# Patient Record
Sex: Female | Born: 1935 | Race: White | Hispanic: No | Smoking: Never smoker
Health system: Southern US, Community
[De-identification: ages and names within clinical notes are randomized; demographics above are authoritative.]

## PROBLEM LIST (undated history)

## (undated) DIAGNOSIS — I639 Cerebral infarction, unspecified: Secondary | ICD-10-CM

## (undated) DIAGNOSIS — N63 Unspecified lump in unspecified breast: Secondary | ICD-10-CM

## (undated) DIAGNOSIS — I4891 Unspecified atrial fibrillation: Secondary | ICD-10-CM

## (undated) DIAGNOSIS — I10 Essential (primary) hypertension: Secondary | ICD-10-CM

## (undated) HISTORY — DX: Cerebral infarction, unspecified: I63.9

## (undated) HISTORY — DX: Unspecified lump in unspecified breast: N63.0

## (undated) HISTORY — PX: DILATION AND CURETTAGE OF UTERUS: SHX78

## (undated) HISTORY — DX: Unspecified atrial fibrillation: I48.91

## (undated) HISTORY — DX: Essential (primary) hypertension: I10

## (undated) HISTORY — PX: FOOT SURGERY: SHX648

---

## 1978-02-10 HISTORY — PX: BREAST SURGERY: SHX581

## 2004-02-09 ENCOUNTER — Other Ambulatory Visit: Payer: Self-pay

## 2004-02-09 ENCOUNTER — Ambulatory Visit: Payer: Self-pay | Admitting: Obstetrics and Gynecology

## 2004-02-16 ENCOUNTER — Ambulatory Visit: Payer: Self-pay | Admitting: Obstetrics and Gynecology

## 2004-04-01 ENCOUNTER — Ambulatory Visit: Payer: Self-pay | Admitting: Internal Medicine

## 2005-01-29 ENCOUNTER — Ambulatory Visit: Payer: Self-pay | Admitting: Internal Medicine

## 2005-04-03 ENCOUNTER — Ambulatory Visit: Payer: Self-pay | Admitting: Internal Medicine

## 2006-04-07 ENCOUNTER — Ambulatory Visit: Payer: Self-pay | Admitting: Internal Medicine

## 2007-04-29 ENCOUNTER — Ambulatory Visit: Payer: Self-pay | Admitting: Internal Medicine

## 2007-06-08 ENCOUNTER — Other Ambulatory Visit: Payer: Self-pay

## 2007-06-08 ENCOUNTER — Ambulatory Visit: Payer: Self-pay | Admitting: Obstetrics and Gynecology

## 2007-06-14 ENCOUNTER — Ambulatory Visit: Payer: Self-pay | Admitting: Obstetrics and Gynecology

## 2008-05-01 ENCOUNTER — Ambulatory Visit: Payer: Self-pay | Admitting: Internal Medicine

## 2009-05-02 ENCOUNTER — Ambulatory Visit: Payer: Self-pay | Admitting: Internal Medicine

## 2010-05-06 ENCOUNTER — Ambulatory Visit: Payer: Self-pay | Admitting: Internal Medicine

## 2011-05-07 ENCOUNTER — Ambulatory Visit: Payer: Self-pay | Admitting: Internal Medicine

## 2012-05-07 ENCOUNTER — Ambulatory Visit: Payer: Self-pay | Admitting: Internal Medicine

## 2012-05-11 ENCOUNTER — Ambulatory Visit: Payer: Self-pay | Admitting: Internal Medicine

## 2012-05-27 ENCOUNTER — Ambulatory Visit (INDEPENDENT_AMBULATORY_CARE_PROVIDER_SITE_OTHER): Payer: Medicare Other | Admitting: General Surgery

## 2012-05-27 ENCOUNTER — Encounter: Payer: Self-pay | Admitting: General Surgery

## 2012-05-27 VITALS — BP 122/62 | HR 70 | Resp 12 | Ht 68.0 in | Wt 201.0 lb

## 2012-05-27 DIAGNOSIS — R920 Mammographic microcalcification found on diagnostic imaging of breast: Secondary | ICD-10-CM | POA: Insufficient documentation

## 2012-05-27 DIAGNOSIS — N63 Unspecified lump in unspecified breast: Secondary | ICD-10-CM | POA: Insufficient documentation

## 2012-05-27 NOTE — Progress Notes (Signed)
Patient ID: Nancy Hahn, female   DOB: 1931/07/06, 79 y.o.   MRN: [id number]  Chief Complaint  Patient presents with  . New Breast Patient    Category 4 mammogram    HPI Nancy Hahn is a 79 y.o. female here today for an abnormal mammogram. Most recent mammogram was done at [facility] on 05/07/12 with birad category 0. Additional views of the left breast on 05/11/12 with birad category 4. The patient has had a left breast biopsy done back in 1980 that was benign. She has a sister with a history of breast cancer. She also had a maternal aunt with ovarian cancer. She denies any problems with her breasts at this time. She does regular self breast exams as well as yearly mammograms.  HPI  Past Medical History  Diagnosis Date  . Breast mass   . Hypertension     Past Surgical History  Procedure Laterality Date  . Breast surgery  1980    biopsy    Family History  Problem Relation Age of Onset  . Emphysema Father   . Cancer Brother     lung  . Cancer Sister     breast  . Cancer Maternal Aunt     ovarian    Social History History  Substance Use Topics  . Smoking status: Never Smoker   . Smokeless tobacco: Never Used  . Alcohol Use: No    No Known Allergies  Current Outpatient Prescriptions  Medication Sig Dispense Refill  . aspirin 81 MG tablet Take 81 mg by mouth daily.      Marland Kitchen atenolol (TENORMIN) 50 MG tablet Take 50 mg by mouth daily.      Marland Kitchen lisinopril (PRINIVIL,ZESTRIL) 40 MG tablet Take 40 mg by mouth daily.      Marland Kitchen NIFEdipine (PROCARDIA XL/ADALAT-CC) 90 MG 24 hr tablet Take 90 mg by mouth daily.      . simvastatin (ZOCOR) 20 MG tablet Take 20 mg by mouth daily.       No current facility-administered medications for this visit.    Review of Systems Review of Systems  Constitutional: Negative.   Respiratory: Negative.   Cardiovascular: Negative.     Blood pressure 122/62, pulse 70, resp. rate 12, height 5\' 8"  (1.727 m), weight 201 lb (91.173 kg).  Physical Exam Physical  Exam  Constitutional: She appears well-developed and well-nourished.  Neck: Trachea normal. No mass and no thyromegaly present.  Cardiovascular: Normal rate, regular rhythm, normal heart sounds and normal pulses.   No murmur heard. Pulmonary/Chest: Effort normal and breath sounds normal. Right breast exhibits no inverted nipple, no mass, no nipple discharge, no skin change and no tenderness. Left breast exhibits no inverted nipple, no mass, no nipple discharge, no skin change and no tenderness. Breasts are symmetrical.  Lymphadenopathy:    She has no cervical adenopathy.    She has no axillary adenopathy.    Data Reviewed Bilateral mammograms on May 07, 2012 as well as left breast unilateral mammograms dated May 11, 2012 were reviewed. A could not clearly delineate the areas of microcalcifications of concern to the radiologist. He will be necessary to review these in person with the radiologist next week. Assessment    Left breast microcalcifications.     Plan    I will arrange to review the mammograms in person with the attending radiologist to determine the area of his concern. If there is a significant interval change from previous films, the patient has been advised a stereotactic biopsy  might be appropriate. Options for management would include: 1) early biopsy versus 2) a 6 month followup study.  The stereotactic procedure was reviewed.       Earline Mayotte 05/28/2012, 8:33 PM

## 2012-05-27 NOTE — Patient Instructions (Addendum)
Dr. Lemar Livings will discuss with Radiologist concerns of mammogram and will give patient a call.

## 2012-05-28 ENCOUNTER — Encounter: Payer: Self-pay | Admitting: General Surgery

## 2012-06-02 ENCOUNTER — Telehealth: Payer: Self-pay | Admitting: *Deleted

## 2012-06-02 ENCOUNTER — Other Ambulatory Visit (INDEPENDENT_AMBULATORY_CARE_PROVIDER_SITE_OTHER): Payer: Self-pay | Admitting: General Surgery

## 2012-06-02 NOTE — Progress Notes (Signed)
The recent mammograms were reviewed with the attending radiologist. Minimal interval change since 2009. Will arrange for a follow up left mammogram in six months.

## 2012-06-02 NOTE — Telephone Encounter (Signed)
Notified patient as instructed, patient pleased. Discussed follow-up appointments, patient agrees  

## 2012-06-02 NOTE — Telephone Encounter (Signed)
Message copied by Currie Paris on Wed Jun 02, 2012  8:40 AM ------      Message from: [place], [place] W      Created: Wed Jun 02, 2012  7:34 AM       Mindi Junker: Please notify the patient I reviewed the mammograms with the radiologist.  Would recommend a repeat mammogram of the left breast in six months. No biopsy at this time.            Michelle: Arrange for left diag mammogram and FSC in Oct w/ OV to follow. Thanks. ------

## 2012-06-07 ENCOUNTER — Other Ambulatory Visit: Payer: Self-pay | Admitting: *Deleted

## 2012-06-07 DIAGNOSIS — R920 Mammographic microcalcification found on diagnostic imaging of breast: Secondary | ICD-10-CM

## 2012-06-07 NOTE — Progress Notes (Signed)
The patient has been asked to return to the office in six months for a unilateral left breast diagnostic mammogram with FSC views.  

## 2012-11-16 ENCOUNTER — Encounter: Payer: Self-pay | Admitting: General Surgery

## 2012-11-16 ENCOUNTER — Ambulatory Visit: Payer: Self-pay | Admitting: General Surgery

## 2012-12-01 ENCOUNTER — Ambulatory Visit: Payer: Medicare Other | Admitting: General Surgery

## 2013-09-21 ENCOUNTER — Ambulatory Visit: Payer: Self-pay | Admitting: Family Medicine

## 2013-12-12 ENCOUNTER — Encounter: Payer: Self-pay | Admitting: General Surgery

## 2014-02-26 ENCOUNTER — Observation Stay: Payer: Self-pay | Admitting: Orthopedic Surgery

## 2014-02-26 LAB — URINALYSIS, COMPLETE
BILIRUBIN, UR: NEGATIVE
Blood: NEGATIVE
Glucose,UR: NEGATIVE mg/dL (ref 0–75)
Hyaline Cast: 2
Ketone: NEGATIVE
LEUKOCYTE ESTERASE: NEGATIVE
Nitrite: NEGATIVE
Ph: 7 (ref 4.5–8.0)
Protein: NEGATIVE
RBC, UR: NONE SEEN /HPF (ref 0–5)
Specific Gravity: 1.005 (ref 1.003–1.030)
WBC UR: 1 /HPF (ref 0–5)

## 2014-02-26 LAB — COMPREHENSIVE METABOLIC PANEL
ANION GAP: 8 (ref 7–16)
Albumin: 3.7 g/dL (ref 3.4–5.0)
Alkaline Phosphatase: 90 U/L
BUN: 22 mg/dL — ABNORMAL HIGH (ref 7–18)
Bilirubin,Total: 0.3 mg/dL (ref 0.2–1.0)
CALCIUM: 8.9 mg/dL (ref 8.5–10.1)
CO2: 25 mmol/L (ref 21–32)
Chloride: 107 mmol/L (ref 98–107)
Creatinine: 1.19 mg/dL (ref 0.60–1.30)
EGFR (African American): 56 — ABNORMAL LOW
GFR CALC NON AF AMER: 46 — AB
Glucose: 125 mg/dL — ABNORMAL HIGH (ref 65–99)
Osmolality: 284 (ref 275–301)
POTASSIUM: 4.6 mmol/L (ref 3.5–5.1)
SGOT(AST): 30 U/L (ref 15–37)
SGPT (ALT): 27 U/L
SODIUM: 140 mmol/L (ref 136–145)
Total Protein: 7.8 g/dL (ref 6.4–8.2)

## 2014-02-26 LAB — CBC
HCT: 36.2 % (ref 35.0–47.0)
HGB: 11.9 g/dL — AB (ref 12.0–16.0)
MCH: 30.2 pg (ref 26.0–34.0)
MCHC: 32.8 g/dL (ref 32.0–36.0)
MCV: 92 fL (ref 80–100)
Platelet: 256 10*3/uL (ref 150–440)
RBC: 3.94 10*6/uL (ref 3.80–5.20)
RDW: 13.3 % (ref 11.5–14.5)
WBC: 15.5 10*3/uL — ABNORMAL HIGH (ref 3.6–11.0)

## 2014-06-11 NOTE — Op Note (Signed)
PATIENT NAME:  Edwin CapWRENN, Tekeya B MR#:  [id number] DATE OF BIRTH:  [birth year]  PREOPERATIVE DIAGNOSIS: Left bimalleolar ankle fracture.   PROCEDURE PERFORMED: Left bimalleolar ankle fracture.  PROCEDURE PERFORMED: Open reduction internal fixation of left ankle.   SURGEON OF RECORD: Thornton Papasobin Eckel, MD.  ANESTHESIA: Spinal.   FLUIDS ADMINISTERED: 900 of crystalloid.   ESTIMATED BLOOD LOSS: 20 mL.   TOTAL TOURNIQUET TIME: 47 minutes.   PREOPERATIVE ANTIBIOTICS: 1 gram Ancef.   COMPLICATIONS: None.   DISPOSITION: Stable to PACU on upon transport.   INDICATIONS FOR PROCEDURE: This is an 81 year old female, who slipped in the bathroom with immediate pain and inability to bear weight on her left lower extremity. X-rays demonstrate a bimalleolar ankle fracture. She was counseled regarding risks and benefits of surgery with the risks to include, but not limited to bleeding, infection, damage to nerves and/or vessels, malunion, nonunion continued pain and the need for additional surgical procedures. She was also counseled regarding the risks and signs and symptoms of DVT postoperatively. She agreed to proceed with surgery after weighing all the risks.   DESCRIPTION OF PROCEDURE: The patient was brought to the operating room and placed on the operative table with all extremities appropriately padded. The left leg was then cleansed with alcohol and dried, then prepped and draped in sterile fashion. Timeout was performed to anesthesia, nursing staff, and surgeon of record to confirm surgical site, patient, medical record number, date of birth, laterality, procedure to be performed, confirmation antibiotics were administered, and all necessary equipment was in the room. We then exsanguinated the extremity with an Esmarch bandage and inflated the tourniquet to 250 mm/Hg. A lateral incision was made overlying the fibula with dissection down to the fracture site. A reduction clamp used to reduce the fracture site,  and this was secured in place with a 2.7 mm lag screw. We then used the DePuy anatomic distal fibular locking plate and secured the plate with 4 locking screws distally, 1 nonlocking compression screw proximally, and 2 additional locking screws proximally. AP and lateral fluoroscopic images confirmed fracture reduction and plate placement. We then turned our attention medially, where incision was made overlying the medial malleolus fragment. This fracture site was also identified and reduced with a reduction clamp and secured it in place with 2 partially threaded cannulated 4.0 screws. Again, final x-rays taken to confirm both fracture reduction and hardware placement. Both wounds were then copiously irrigated with sterile saline and the tourniquet was released at 47 minutes, and hemostasis was obtained. The wound was then closed sequentially with 2-0 Vicryl followed by 3-0 Monocryl and running 3-0 nylon. A sterile dressing was applied and a well-padded L and U splint was placed. The patient was transferred to the PACU in stable condition.   ____________________________ Danelle Earthlyobin T. Eckel, MD tte:ap D: 02/26/2014 19:13:29 ET T: 02/26/2014 21:08:22 ET JOB#: [id number]  cc: Danelle Earthlyobin T. Eckel, MD, <Dictator> Danelle EarthlyBIN T ECKEL MD ELECTRONICALLY SIGNED 02/28/2014 20:22

## 2014-06-11 NOTE — Consult Note (Signed)
PATIENT NAME:  Nancy Hahn, Nancy Hahn MR#:  [id number] DATE OF BIRTH:  28-Oct-1931  PRIMARY CARE PHYSICIAN: Kandyce RudMarcus Babaoff, MD  CONSULTING PHYSICIAN:  Danelle Earthlyobin T. Eckel, MD of orthopedics.   REASON FOR CONSULTATION: Preop evaluation hypertension.   HISTORY OF PRESENT ILLNESS: An 81 year old Caucasian female patient with history of hypertension, hyperlipidemia, presents to the emergency room after she slipped in the bathroom and fell. The patient had acute pain in her left ankle. Here, she has been diagnosed with left ankle fracture on the x-rays, going to the operating room, and the hospitalist team has been consulted for preop evaluation and hypertension management.   The patient has had some D and C surgeries a few years back with no complications to anesthesia. She seems to have good functional status, walks, climbs stairs without any shortness of breath or chest pain. She had a stress just about 5 years back with her PCP, which was normal.   She does not have any cardiac history, strokes, liver disease.   PAST MEDICAL HISTORY:  1. Hypertension.  2. Hyperlipidemia.   SOCIAL HISTORY: The patient does not smoke. No alcohol. No illicit drugs. Lives alone. Ambulates on her own.   CODE STATUS: Full code.   FAMILY HISTORY: Diabetes in her sister and mother.   HOME MEDICATIONS:  1. Simvastatin 20 mg daily.  2. Norco 325/5 mg 1 tablet every 6 hours as needed.  3. Nifedipine extended release 90 mg daily.  4. Lisinopril 40 mg daily.  5. Atenolol 50 mg daily.   ALLERGIES: NO KNOWN DRUG ALLERGIES.   REVIEW OF SYSTEMS: CONSTITUTIONAL: No fever or fatigue.  EYES: No blurred vision, pain or redness.  HEENT: No tinnitus, ear pain, hearing loss.  RESPIRATORY: No cough, wheeze, hemoptysis.  CARDIOVASCULAR: No dyspnea, orthopnea, or edema.  GASTROINTESTINAL: No nausea, vomiting, diarrhea, abdominal pain.  GENITOURINARY: No dysuria, hematuria, or frequency.  ENDOCRINE: No polyuria, nocturia, thyroid  problems.  HEMATOLOGIC AND LYMPHATIC: No anemia, easy bruising, bleeding.  INTEGUMENTARY: No acne, rash, lesion.  MUSCULOSKELETAL: No back pain, arthritis.  NEUROLOGIC: No focal numbness, weakness, seizure.  PSYCHIATRIC: No anxiety or depression.  EXTREMITIES: Has left ankle swelling and pain with splint.   DATA LABORATORY AND IMAGING STUDIES: Glucose 125, BUN 22, creatinine 1.19, GFR 46. AST, ALT, alkaline phosphatase, bilirubin normal. WBC 15.5, hemoglobin 11.9, platelets of 256,000. Urinalysis shows trace bacteria, 1 WBC.   Chest x-ray shows changes of COPD, nothing acute.   CT scan of the head without contrast shows enlarged ventricles due to atrophy, nothing acute.   Left ankle x-ray shows left ankle fracture.   EKG shows normal sinus rhythm, nonspecific ST-T wave changes.   ASSESSMENT AND PLAN:  1. Left ankle fracture. The patient is going to the OR. She will be low risk for surgery. I discussed with Dr. Rodena MedinEckel of orthopedics. The patient did not need any further investigations to surgery. We will watch out if she has any complications. We will add incentive spirometer. Deep vein prophylaxis per orthopedics.  2. Hypertension. Will continue home medications and can use IV p.r.n. labetalol 10 mg if needed.  3. Hyperlipidemia. The patient is on simvastatin.   CODE STATUS: Full code.   Time Spent on this consult was 45 minutes.   ____________________________ Molinda BailiffSrikar R. Colvin Blatt, MD srs:ap D: 02/26/2014 16:43:02 ET T: 02/26/2014 21:17:54 ET JOB#: [id number]  cc: Wardell HeathSrikar R. Elpidio AnisSudini, MD, <Dictator> Kandyce RudMarcus Babaoff, MD Orie FishermanSRIKAR R Zianna Dercole MD ELECTRONICALLY SIGNED 02/27/2014 16:49

## 2014-06-11 NOTE — Discharge Summary (Signed)
PATIENT NAME:  Nancy Hahn, Nancy Hahn MR#:  [id number] DATE OF BIRTH:  1931/05/28  DATE OF ADMISSION:  02/26/2014 DATE OF DISCHARGE:  02/28/2014  ADMITTING DIAGNOSIS:  Left bimalleolar ankle fracture.   DISCHARGE DIAGNOSIS: Left bimalleolar ankle fracture.   PROCEDURE PERFORMED: Open reduction internal fixation of left ankle.   SURGEON OF RECORD: Thornton Papasobin Eckel, M.D.   ANESTHESIA: Spinal.  FLUIDS ADMINISTERED:  900 mL of crystalloid.   ESTIMATED BLOOD LOSS:  20 mL.  TOURNIQUET TIME:  47 minutes.   PREOPERATIVE ANTIBIOTICS:  1 gram Ancef.   COMPLICATIONS: None.   DISPOSITION: Stable to PACU upon transport.   HISTORY: The patient is an 81 year old female who slipped in the bathroom with immediate pain and inability to bear weight on the left lower extremity. X-rays demonstrate bimalleolar ankle fracture. She was counseled regarding risks and benefits of surgery with the risks including, but not limited to, bleeding, infection, damage to nerves and/or vessels, malunion, nonunion, continued pain and the need for additional surgical procedures. She was also counseled regarding the risks and signs and symptoms of DVT postoperatively. She agreed to proceed with surgery weighing all the risks.   PHYSICAL EXAMINATION:  GENERAL: Well developed.  RESPIRATIONS: Normal respiratory effort.  CARDIOVASCULAR: Regular rate.  ABDOMEN: Soft.  SKIN: Mild swelling left lower extremity splint in place. Palpable DP, PT pulses,   HOSPITAL COURSE: The patient was admitted to the hospital on 02/26/2014 through the Emergency Department. She underwent surgery that day and went to the orthopedic floor from the PACU in stable condition. On postoperative day 1, the patient's vital signs are stable. She had slow progress with physical therapy. Labs were stable. On postoperative day 2 the patient was stable and ready for discharge to a rehab facility. Unfortunately patient did not have authorization from medicare. Patients  discharge was delayed until POD3. On POD 3, patient was stable and ready for [facility]. She will continue with physical therapy. [facility].   DISCHARGE INSTRUCTIONS: Do not put any weight on the affected extremity. Elevate the affected foot or leg on 1 or 2 pillows with the foot higher than the knee. Knee-high TED hose on both legs and remove 1 hour per 8 hour shift. Elevate the heels off the bed.  Incentive spirometer every hour while awake. Encourage cough and deep breathing. The patient may resume a regular diet as tolerated. Continue using Polar Care unit maintaining temp between 40 and 50 degrees. Do not get the dressing or bandage wet or dirty. [facility] if the dressing gets water under it. Leave the dressing on. [facility] if any of the following occur: Bright red bleeding from the incision wound, fever above 101.5 degrees, redness, swelling, or drainage at the incision. Call [facility] if you experience any increased leg pain, numbness, or weakness in legs or bowel or bladder symptoms.   DISCHARGE MEDICATIONS: Please see list of discharge medications from discharge instructions.    ____________________________ T. Cranston Neighborhris Tylie Golonka, PA-C tcg:at D: 02/28/2014 08:53:00 ET T: 02/28/2014 09:13:34 ET JOB#: [id number]  cc: T. Cranston Neighborhris Aqueelah Cotrell, PA-C, <Dictator> Evon SlackHOMAS C Alford Gamero GeorgiaPA ELECTRONICALLY SIGNED 03/13/2014 8:41

## 2014-06-11 NOTE — H&P (Signed)
   Subjective/Chief Complaint Left ankle pain   History of Present Illness 81 y/o female who slipped in the bathroom this morning with immediate pain and inability to bear weight   Past History HTN   Past Med/Surgical Hx:  High Cholesterol:   HTN:   ALLERGIES:  No Known Allergies:   HOME MEDICATIONS: Medication Instructions Status  atenolol 50 mg oral tablet 1 tab(s) orally once a day Active  lisinopril 40 mg oral tablet 1 tab(s) orally once a day Active  simvastatin 20 mg oral tablet 1 tab(s) orally once a day (at bedtime) Active  NIFEdipine extended release 90 mg oral tablet, extended release 1 tab(s) orally once a day Active   Family and Social History:  Family History Non-Contributory   Social History negative tobacco, negative ETOH   Place of Living Home   Review of Systems:  Fever/Chills No   Cough No   Sputum No   Abdominal Pain No   Diarrhea No   Constipation No   Nausea/Vomiting No   SOB/DOE No   Chest Pain No   Physical Exam:  GEN well developed   RESP normal resp effort   CARD regular rate   ABD soft   SKIN mild swelling LLE, splint in place, palpable DP/PT pulses, SILT in all distributions    Assessment/Admission Diagnosis Left closed bimalleolar ankle fracture   Plan To the OR for ORIF left ankle. Patient was seen and evaluated by the hospitalist service and deemed to be low risk for surgery   Electronic Signatures: Danelle EarthlyEckel, Tobin T (MD)  (Signed 17-Jan-16 16:01)  Authored: CHIEF COMPLAINT and HISTORY, PAST MEDICAL/SURGIAL HISTORY, ALLERGIES, HOME MEDICATIONS, FAMILY AND SOCIAL HISTORY, REVIEW OF SYSTEMS, PHYSICAL EXAM, ASSESSMENT AND PLAN   Last Updated: 17-Jan-16 16:01 by Danelle EarthlyEckel, Tobin T (MD)

## 2015-02-27 ENCOUNTER — Encounter: Payer: Self-pay | Admitting: Emergency Medicine

## 2015-02-27 ENCOUNTER — Inpatient Hospital Stay
Admission: EM | Admit: 2015-02-27 | Discharge: 2015-03-03 | DRG: 270 | Disposition: A | Discharge status: Other Acute Inpatient Hospital | Payer: Commercial Managed Care - HMO | Attending: Internal Medicine | Admitting: Internal Medicine

## 2015-02-27 ENCOUNTER — Emergency Department: Payer: Commercial Managed Care - HMO

## 2015-02-27 DIAGNOSIS — Z825 Family history of asthma and other chronic lower respiratory diseases: Secondary | ICD-10-CM

## 2015-02-27 DIAGNOSIS — R4182 Altered mental status, unspecified: Secondary | ICD-10-CM

## 2015-02-27 DIAGNOSIS — Z4659 Encounter for fitting and adjustment of other gastrointestinal appliance and device: Secondary | ICD-10-CM

## 2015-02-27 DIAGNOSIS — I614 Nontraumatic intracerebral hemorrhage in cerebellum: Secondary | ICD-10-CM | POA: Diagnosis not present

## 2015-02-27 DIAGNOSIS — M79602 Pain in left arm: Secondary | ICD-10-CM | POA: Diagnosis not present

## 2015-02-27 DIAGNOSIS — M79603 Pain in arm, unspecified: Secondary | ICD-10-CM | POA: Diagnosis not present

## 2015-02-27 DIAGNOSIS — I749 Embolism and thrombosis of unspecified artery: Secondary | ICD-10-CM | POA: Diagnosis present

## 2015-02-27 DIAGNOSIS — R404 Transient alteration of awareness: Secondary | ICD-10-CM | POA: Diagnosis not present

## 2015-02-27 DIAGNOSIS — Z803 Family history of malignant neoplasm of breast: Secondary | ICD-10-CM

## 2015-02-27 DIAGNOSIS — J690 Pneumonitis due to inhalation of food and vomit: Secondary | ICD-10-CM | POA: Diagnosis not present

## 2015-02-27 DIAGNOSIS — R0902 Hypoxemia: Secondary | ICD-10-CM

## 2015-02-27 DIAGNOSIS — I10 Essential (primary) hypertension: Secondary | ICD-10-CM | POA: Diagnosis present

## 2015-02-27 DIAGNOSIS — Z833 Family history of diabetes mellitus: Secondary | ICD-10-CM

## 2015-02-27 DIAGNOSIS — Y95 Nosocomial condition: Secondary | ICD-10-CM | POA: Diagnosis not present

## 2015-02-27 DIAGNOSIS — I742 Embolism and thrombosis of arteries of the upper extremities: Secondary | ICD-10-CM | POA: Diagnosis not present

## 2015-02-27 DIAGNOSIS — Z978 Presence of other specified devices: Secondary | ICD-10-CM

## 2015-02-27 DIAGNOSIS — Z7982 Long term (current) use of aspirin: Secondary | ICD-10-CM

## 2015-02-27 DIAGNOSIS — R079 Chest pain, unspecified: Secondary | ICD-10-CM | POA: Diagnosis not present

## 2015-02-27 DIAGNOSIS — R29810 Facial weakness: Secondary | ICD-10-CM | POA: Diagnosis not present

## 2015-02-27 DIAGNOSIS — Z801 Family history of malignant neoplasm of trachea, bronchus and lung: Secondary | ICD-10-CM

## 2015-02-27 DIAGNOSIS — R52 Pain, unspecified: Secondary | ICD-10-CM | POA: Diagnosis not present

## 2015-02-27 DIAGNOSIS — I8290 Acute embolism and thrombosis of unspecified vein: Secondary | ICD-10-CM

## 2015-02-27 DIAGNOSIS — E785 Hyperlipidemia, unspecified: Secondary | ICD-10-CM | POA: Diagnosis present

## 2015-02-27 DIAGNOSIS — Z79899 Other long term (current) drug therapy: Secondary | ICD-10-CM

## 2015-02-27 DIAGNOSIS — I708 Atherosclerosis of other arteries: Secondary | ICD-10-CM | POA: Diagnosis not present

## 2015-02-27 LAB — BASIC METABOLIC PANEL
ANION GAP: 15 (ref 5–15)
BUN: 28 mg/dL — ABNORMAL HIGH (ref 6–20)
CALCIUM: 10.4 mg/dL — AB (ref 8.9–10.3)
CHLORIDE: 103 mmol/L (ref 101–111)
CO2: 26 mmol/L (ref 22–32)
Creatinine, Ser: 1.27 mg/dL — ABNORMAL HIGH (ref 0.44–1.00)
GFR calc non Af Amer: 38 mL/min — ABNORMAL LOW (ref >60)
GFR, EST AFRICAN AMERICAN: 45 mL/min — AB (ref >60)
Glucose, Bld: 116 mg/dL — ABNORMAL HIGH (ref 65–99)
Potassium: 4.7 mmol/L (ref 3.5–5.1)
SODIUM: 144 mmol/L (ref 135–145)

## 2015-02-27 LAB — CBC
HCT: 40.8 % (ref 35.0–47.0)
HEMOGLOBIN: 13 g/dL (ref 12.0–16.0)
MCH: 29.4 pg (ref 26.0–34.0)
MCHC: 31.8 g/dL — ABNORMAL LOW (ref 32.0–36.0)
MCV: 92.6 fL (ref 80.0–100.0)
PLATELETS: 232 10*3/uL (ref 150–440)
RBC: 4.41 MIL/uL (ref 3.80–5.20)
RDW: 14.2 % (ref 11.5–14.5)
WBC: 13.8 10*3/uL — AB (ref 3.6–11.0)

## 2015-02-27 LAB — TROPONIN I

## 2015-02-27 MED ORDER — IOHEXOL 350 MG/ML SOLN
75.0000 mL | Freq: Once | INTRAVENOUS | Status: AC | PRN
  Administered 2015-02-27: 75 mL via INTRAVENOUS

## 2015-02-27 NOTE — ED Notes (Signed)
Patient transported to CT 

## 2015-02-27 NOTE — ED Provider Notes (Signed)
[facility] Emergency Department Provider Note    ____________________________________________  Time seen: 2235  I have reviewed the triage vital signs and the nursing notes.   HISTORY  Chief Complaint Arm Pain and Numbness   History limited by: Not Limited   HPI Nancy Hahn is a 82 y.o. female who presents to the emergency department today because of concern for left arm pain and numbness. She states that these symptoms started abruptly. They started roughly 6 hours ago. They have been constant since then. She describes the pain as being located in her left elbow. She has numbness that radiates down her left arm. She denies any trauma to her arm. She denies similar symptoms in the past. She denies any history of blood clots. Denies any history of travel. No history of atrial fibrillation.     Past Medical History  Diagnosis Date  . Breast mass   . Hypertension     Patient Active Problem List   Diagnosis Date Noted  . Breast microcalcification, mammographic 05/27/2012  . Breast mass     Past Surgical History  Procedure Laterality Date  . Breast surgery  1980    biopsy  . Dilation and curettage of uterus    . Foot surgery      Current Outpatient Rx  Name  Route  Sig  Dispense  Refill  . aspirin 81 MG tablet   Oral   Take 81 mg by mouth daily.         Marland Kitchen atenolol (TENORMIN) 50 MG tablet   Oral   Take 50 mg by mouth daily.         Marland Kitchen lisinopril (PRINIVIL,ZESTRIL) 40 MG tablet   Oral   Take 40 mg by mouth daily.         Marland Kitchen NIFEdipine (PROCARDIA XL/ADALAT-CC) 90 MG 24 hr tablet   Oral   Take 90 mg by mouth daily.         . simvastatin (ZOCOR) 20 MG tablet   Oral   Take 20 mg by mouth daily.           Allergies Review of patient's allergies indicates no known allergies.  Family History  Problem Relation Age of Onset  . Emphysema Father   . Cancer Brother     lung  . Cancer Sister     breast  . Cancer Maternal  Aunt     ovarian    Social History Social History  Substance Use Topics  . Smoking status: Never Smoker   . Smokeless tobacco: Never Used  . Alcohol Use: No    Review of Systems  Constitutional: Negative for fever. Cardiovascular: Negative for chest pain. Respiratory: Negative for shortness of breath. Gastrointestinal: Negative for abdominal pain, vomiting and diarrhea. Musculoskeletal: Positive for left arm pain. Skin: Negative for rash. Neurological: Negative for headaches, focal weakness or numbness.   10-point ROS otherwise negative.  ____________________________________________   PHYSICAL EXAM:  VITAL SIGNS: ED Triage Vitals  Enc Vitals Group     BP 02/27/15 1946 145/86 mmHg     Pulse Rate 02/27/15 1946 70     Resp 02/27/15 1946 18     Temp 02/27/15 1946 97.4 F (36.3 C)     Temp Source 02/27/15 1946 Oral     SpO2 02/27/15 1946 100 %     Weight 02/27/15 1946 198 lb (89.812 kg)     Height 02/27/15 1946  (1.727 m)     Head Cir --  Peak Flow --      Pain Score 02/27/15 1946 10   Constitutional: Alert and oriented. Well appearing and in no distress. Eyes: Conjunctivae are normal. PERRL. Normal extraocular movements. ENT   Head: Normocephalic and atraumatic.   Nose: No congestion/rhinnorhea.   Mouth/Throat: Mucous membranes are moist.   Neck: No stridor. Hematological/Lymphatic/Immunilogical: No cervical lymphadenopathy. Cardiovascular: Normal rate, regular rhythm.  No murmurs, rubs, or gallops. Respiratory: Normal respiratory effort without tachypnea nor retractions. Breath sounds are clear and equal bilaterally. No wheezes/rales/rhonchi. Gastrointestinal: Soft and nontender. No distention.  Genitourinary: Deferred Musculoskeletal: Patient with diminished yet palpable radial pulse on the left side. Left fingernail beds gray compared to right fingernail beds with decreased cap refill. No swelling of the extremity.  Neurologic:  Normal  speech and language. Grip strength 5 out of 5 bilaterally. Sensation subjectively decreased on the left forearm. Skin:  Skin is warm, dry and intact. No rash noted. Psychiatric: Mood and affect are normal. Speech and behavior are normal. Patient exhibits appropriate insight and judgment.  ____________________________________________    LABS (pertinent positives/negatives)  Labs Reviewed  BASIC METABOLIC PANEL - Abnormal; Notable for the following:    Glucose, Bld 116 (*)    BUN 28 (*)    Creatinine, Ser 1.27 (*)    Calcium 10.4 (*)    GFR calc non Af Amer 38 (*)    GFR calc Af Amer 45 (*)    All other components within normal limits  CBC - Abnormal; Notable for the following:    WBC 13.8 (*)    MCHC 31.8 (*)    All other components within normal limits  TROPONIN I  APTT  PROTIME-INR  HEPARIN LEVEL (UNFRACTIONATED)    ____________________________________________   EKG  I, Phineas Semen, attending physician, personally viewed and interpreted this EKG  EKG Time: 1949 Rate: 68 Rhythm: NSR Axis: normal Intervals: qtc 429 QRS: narrow ST changes: no st elevation Impression: normal ekg  ____________________________________________    RADIOLOGY  CT angiogram  IMPRESSION: Segmental narrowing of the left subclavian, axillary, and proximal brachial arteries.  Limited evaluation of the mid and distal portion of the radial and ulna arteries due to suboptimal opacification concerning for diminished flow related to underlying peripheral vascular disease. Correlation with clinical exam and further evaluation with duplex ultrasound recommended.  CXR  IMPRESSION: No acute abnormality.  ____________________________________________   PROCEDURES  Procedure(s) performed: None  Critical Care performed: Yes, see critical care note(s)  CRITICAL CARE Performed by: Phineas Semen   Total critical care time: 35 minutes  Critical care time was exclusive of  separately billable procedures and treating other patients.  Critical care was necessary to treat or prevent imminent or life-threatening deterioration.  Critical care was time spent personally by me on the following activities: development of treatment plan with patient and/or surrogate as well as nursing, discussions with consultants, evaluation of patient's response to treatment, examination of patient, obtaining history from patient or surrogate, ordering and performing treatments and interventions, ordering and review of laboratory studies, ordering and review of radiographic studies, pulse oximetry and re-evaluation of patient's condition.  ____________________________________________   INITIAL IMPRESSION / ASSESSMENT AND PLAN / ED COURSE  Pertinent labs & imaging results that were available during my care of the patient were reviewed by me and considered in my medical decision making (see chart for details).  Patient presented to the emergency department today because of concerns for left arm pain. On my exam patient did have a diminished radial  pulse and did have some discoloration to the fingernail beds with decreased cap refill. I ordered a CT angiogram given concern for arterial embolism. CT angiogram did have findings which raise concern for occlusion. On repeat exam patient started to have some discoloration of the distal digits and her hand had become cold.  I had a discussion with Dr. Wyn Quaker who recommended heparin. Will plan on admission to the hospitalist service.   ____________________________________________   FINAL CLINICAL IMPRESSION(S) / ED DIAGNOSES  Final diagnoses:  Pain  Arterial embolism (HCC)     Phineas Semen, MD 02/28/15 9604

## 2015-02-27 NOTE — ED Notes (Addendum)
Pt states she was taking a shower and her left arm started to hurt around 1630. Pt states she went to lie down on the bed and her sister called the ambulance. Denies chest pain, nausea, or sob. unsure of any injury. Pt reports her arm feels heavy. Pt currently is answering questions without difficulty. Speech clear with on obvious neuro deficits. Pt alert and calm.

## 2015-02-28 ENCOUNTER — Inpatient Hospital Stay: Admit: 2015-02-28 | Payer: Commercial Managed Care - HMO

## 2015-02-28 ENCOUNTER — Inpatient Hospital Stay (HOSPITAL_COMMUNITY)
Admit: 2015-02-28 | Discharge: 2015-02-28 | Disposition: A | Discharge status: Home / Self Care | Payer: Commercial Managed Care - HMO | Attending: Vascular Surgery | Admitting: Vascular Surgery

## 2015-02-28 ENCOUNTER — Inpatient Hospital Stay: Payer: Commercial Managed Care - HMO | Admitting: Anesthesiology

## 2015-02-28 ENCOUNTER — Encounter
Admission: EM | Disposition: A | Discharge status: Other Acute Inpatient Hospital | Payer: Self-pay | Source: Home / Self Care | Attending: Internal Medicine

## 2015-02-28 ENCOUNTER — Encounter: Payer: Self-pay | Admitting: Internal Medicine

## 2015-02-28 DIAGNOSIS — Z801 Family history of malignant neoplasm of trachea, bronchus and lung: Secondary | ICD-10-CM | POA: Diagnosis not present

## 2015-02-28 DIAGNOSIS — Y95 Nosocomial condition: Secondary | ICD-10-CM | POA: Diagnosis not present

## 2015-02-28 DIAGNOSIS — R0902 Hypoxemia: Secondary | ICD-10-CM | POA: Diagnosis not present

## 2015-02-28 DIAGNOSIS — R29810 Facial weakness: Secondary | ICD-10-CM | POA: Diagnosis not present

## 2015-02-28 DIAGNOSIS — I10 Essential (primary) hypertension: Secondary | ICD-10-CM | POA: Diagnosis not present

## 2015-02-28 DIAGNOSIS — E785 Hyperlipidemia, unspecified: Secondary | ICD-10-CM | POA: Diagnosis not present

## 2015-02-28 DIAGNOSIS — I749 Embolism and thrombosis of unspecified artery: Secondary | ICD-10-CM | POA: Diagnosis present

## 2015-02-28 DIAGNOSIS — I82622 Acute embolism and thrombosis of deep veins of left upper extremity: Secondary | ICD-10-CM | POA: Diagnosis not present

## 2015-02-28 DIAGNOSIS — I340 Nonrheumatic mitral (valve) insufficiency: Secondary | ICD-10-CM | POA: Diagnosis not present

## 2015-02-28 DIAGNOSIS — Z803 Family history of malignant neoplasm of breast: Secondary | ICD-10-CM | POA: Diagnosis not present

## 2015-02-28 DIAGNOSIS — J690 Pneumonitis due to inhalation of food and vomit: Secondary | ICD-10-CM | POA: Diagnosis not present

## 2015-02-28 DIAGNOSIS — Z7982 Long term (current) use of aspirin: Secondary | ICD-10-CM | POA: Diagnosis not present

## 2015-02-28 DIAGNOSIS — Z825 Family history of asthma and other chronic lower respiratory diseases: Secondary | ICD-10-CM | POA: Diagnosis not present

## 2015-02-28 DIAGNOSIS — M79603 Pain in arm, unspecified: Secondary | ICD-10-CM | POA: Diagnosis present

## 2015-02-28 DIAGNOSIS — M79622 Pain in left upper arm: Secondary | ICD-10-CM | POA: Diagnosis not present

## 2015-02-28 DIAGNOSIS — Z833 Family history of diabetes mellitus: Secondary | ICD-10-CM | POA: Diagnosis not present

## 2015-02-28 DIAGNOSIS — R404 Transient alteration of awareness: Secondary | ICD-10-CM | POA: Diagnosis not present

## 2015-02-28 DIAGNOSIS — Z79899 Other long term (current) drug therapy: Secondary | ICD-10-CM | POA: Diagnosis not present

## 2015-02-28 DIAGNOSIS — I742 Embolism and thrombosis of arteries of the upper extremities: Secondary | ICD-10-CM | POA: Diagnosis not present

## 2015-02-28 DIAGNOSIS — I614 Nontraumatic intracerebral hemorrhage in cerebellum: Secondary | ICD-10-CM | POA: Diagnosis not present

## 2015-02-28 HISTORY — PX: EMBOLECTOMY: SHX44

## 2015-02-28 HISTORY — PX: PERIPHERAL VASCULAR CATHETERIZATION: SHX172C

## 2015-02-28 LAB — PROTIME-INR
INR: 1.06
PROTHROMBIN TIME: 14 s (ref 11.4–15.0)

## 2015-02-28 LAB — APTT: APTT: 27 s (ref 24–36)

## 2015-02-28 LAB — HEPARIN LEVEL (UNFRACTIONATED): Heparin Unfractionated: 0.83 IU/mL — ABNORMAL HIGH (ref 0.30–0.70)

## 2015-02-28 LAB — TSH: TSH: 4.496 u[IU]/mL (ref 0.350–4.500)

## 2015-02-28 LAB — HEMOGLOBIN A1C: Hgb A1c MFr Bld: 6.6 % — ABNORMAL HIGH (ref 4.0–6.0)

## 2015-02-28 SURGERY — UPPER EXTREMITY ANGIOGRAPHY
Anesthesia: Moderate Sedation | Laterality: Left

## 2015-02-28 SURGERY — EMBOLECTOMY
Anesthesia: General | Laterality: Left | Wound class: Clean

## 2015-02-28 MED ORDER — DEXTROSE 5 % IV SOLN
INTRAVENOUS | Status: DC
  Filled 2015-02-28: qty 500

## 2015-02-28 MED ORDER — MORPHINE SULFATE (PF) 2 MG/ML IV SOLN: 2.0000 mg | INTRAVENOUS | Status: DC | PRN

## 2015-02-28 MED ORDER — EPHEDRINE SULFATE 50 MG/ML IJ SOLN
INTRAMUSCULAR | Status: DC | PRN
  Administered 2015-02-28: 5 mg via INTRAVENOUS
  Administered 2015-02-28 (×3): 10 mg via INTRAVENOUS

## 2015-02-28 MED ORDER — ONDANSETRON HCL 4 MG/2ML IJ SOLN
4.0000 mg | Freq: Four times a day (QID) | INTRAMUSCULAR | Status: DC | PRN
  Administered 2015-03-01 (×2): 4 mg via INTRAVENOUS
  Filled 2015-02-28 (×2): qty 2

## 2015-02-28 MED ORDER — GLYCOPYRROLATE 0.2 MG/ML IJ SOLN
INTRAMUSCULAR | Status: DC | PRN
  Administered 2015-02-28: .2 mg via INTRAVENOUS

## 2015-02-28 MED ORDER — ALTEPLASE 2 MG IJ SOLR
INTRAMUSCULAR | Status: AC
  Filled 2015-02-28: qty 6

## 2015-02-28 MED ORDER — FENTANYL CITRATE (PF) 100 MCG/2ML IJ SOLN
INTRAMUSCULAR | Status: DC | PRN
  Administered 2015-02-28: 50 ug via INTRAVENOUS
  Administered 2015-02-28: 25 ug via INTRAVENOUS

## 2015-02-28 MED ORDER — LISINOPRIL 20 MG PO TABS
40.0000 mg | ORAL_TABLET | Freq: Every day | ORAL | Status: DC
  Administered 2015-02-28 – 2015-03-03 (×4): 40 mg via ORAL
  Filled 2015-02-28 (×4): qty 2

## 2015-02-28 MED ORDER — MIDAZOLAM HCL 2 MG/2ML IJ SOLN
INTRAMUSCULAR | Status: DC | PRN
  Administered 2015-02-28: 1 mg via INTRAVENOUS

## 2015-02-28 MED ORDER — HEPARIN SOD (PORK) LOCK FLUSH 10 UNIT/ML IV SOLN
INTRAVENOUS | Status: DC | PRN
  Administered 2015-02-28: 1 mL

## 2015-02-28 MED ORDER — MIDAZOLAM HCL 2 MG/2ML IJ SOLN
INTRAMUSCULAR | Status: DC | PRN
  Administered 2015-02-28: 1 mg via INTRAVENOUS
  Administered 2015-02-28: 2 mg via INTRAVENOUS

## 2015-02-28 MED ORDER — ALTEPLASE 2 MG IJ SOLR
INTRAMUSCULAR | Status: DC | PRN
  Administered 2015-02-28: 6 mg

## 2015-02-28 MED ORDER — HEPARIN SODIUM (PORCINE) 1000 UNIT/ML IJ SOLN
INTRAMUSCULAR | Status: AC
  Filled 2015-02-28: qty 1

## 2015-02-28 MED ORDER — MIDAZOLAM HCL 5 MG/5ML IJ SOLN
INTRAMUSCULAR | Status: AC
  Filled 2015-02-28: qty 5

## 2015-02-28 MED ORDER — SIMVASTATIN 20 MG PO TABS
20.0000 mg | ORAL_TABLET | Freq: Every day | ORAL | Status: DC
  Administered 2015-02-28 – 2015-03-03 (×4): 20 mg via ORAL
  Filled 2015-02-28 (×4): qty 1

## 2015-02-28 MED ORDER — IOHEXOL 300 MG/ML  SOLN
INTRAMUSCULAR | Status: DC | PRN
  Administered 2015-02-28: 50 mL via INTRA_ARTERIAL

## 2015-02-28 MED ORDER — DEXTROSE 5 % IV SOLN
INTRAVENOUS | Status: AC
  Filled 2015-02-28: qty 1.5

## 2015-02-28 MED ORDER — ACETAMINOPHEN 650 MG RE SUPP: 650.0000 mg | Freq: Four times a day (QID) | RECTAL | Status: DC | PRN

## 2015-02-28 MED ORDER — SODIUM CHLORIDE 0.9 % IV SOLN
INTRAVENOUS | Status: DC
  Administered 2015-02-28 – 2015-03-02 (×6): via INTRAVENOUS

## 2015-02-28 MED ORDER — OXYCODONE-ACETAMINOPHEN 5-325 MG PO TABS
1.0000 | ORAL_TABLET | Freq: Four times a day (QID) | ORAL | Status: DC | PRN
  Administered 2015-03-01: 1 via ORAL
  Filled 2015-02-28: qty 1

## 2015-02-28 MED ORDER — HYDRALAZINE HCL 20 MG/ML IJ SOLN
INTRAMUSCULAR | Status: AC
  Filled 2015-02-28: qty 1

## 2015-02-28 MED ORDER — ONDANSETRON HCL 4 MG PO TABS: 4.0000 mg | ORAL_TABLET | Freq: Four times a day (QID) | ORAL | Status: DC | PRN

## 2015-02-28 MED ORDER — HYDRALAZINE HCL 20 MG/ML IJ SOLN
INTRAMUSCULAR | Status: DC | PRN
  Administered 2015-02-28: 10 mg via INTRAVENOUS
  Administered 2015-02-28: 20 mg via INTRAVENOUS
  Administered 2015-02-28: 10 mg via INTRAVENOUS

## 2015-02-28 MED ORDER — HEPARIN (PORCINE) IN NACL 2-0.9 UNIT/ML-% IJ SOLN
INTRAMUSCULAR | Status: AC
  Filled 2015-02-28: qty 1000

## 2015-02-28 MED ORDER — HEPARIN (PORCINE) IN NACL 100-0.45 UNIT/ML-% IJ SOLN
1250.0000 [IU]/h | INTRAMUSCULAR | Status: DC
  Administered 2015-02-28: 1400 [IU]/h via INTRAVENOUS
  Filled 2015-02-28 (×3): qty 250

## 2015-02-28 MED ORDER — CHLORHEXIDINE GLUCONATE CLOTH 2 % EX PADS
6.0000 | MEDICATED_PAD | Freq: Once | CUTANEOUS | Status: DC
Start: 2015-02-28 — End: 2015-02-28

## 2015-02-28 MED ORDER — HEPARIN SODIUM (PORCINE) 1000 UNIT/ML IJ SOLN
INTRAMUSCULAR | Status: DC | PRN
  Administered 2015-02-28: 3000 [IU] via INTRAVENOUS

## 2015-02-28 MED ORDER — ACETAMINOPHEN 325 MG PO TABS
650.0000 mg | ORAL_TABLET | Freq: Four times a day (QID) | ORAL | Status: DC | PRN
  Administered 2015-03-02 – 2015-03-03 (×2): 650 mg via ORAL
  Filled 2015-02-28 (×2): qty 2

## 2015-02-28 MED ORDER — HEPARIN (PORCINE) IN NACL 100-0.45 UNIT/ML-% IJ SOLN
1350.0000 [IU]/h | INTRAMUSCULAR | Status: DC
  Administered 2015-02-28: 1250 [IU]/h via INTRAVENOUS
  Filled 2015-02-28 (×2): qty 250

## 2015-02-28 MED ORDER — DOCUSATE SODIUM 100 MG PO CAPS
100.0000 mg | ORAL_CAPSULE | Freq: Two times a day (BID) | ORAL | Status: DC
  Administered 2015-02-28 – 2015-03-03 (×6): 100 mg via ORAL
  Filled 2015-02-28 (×8): qty 1

## 2015-02-28 MED ORDER — SODIUM CHLORIDE 0.9 % IJ SOLN
3.0000 mL | Freq: Two times a day (BID) | INTRAMUSCULAR | Status: DC
  Administered 2015-02-28 – 2015-03-03 (×7): 3 mL via INTRAVENOUS

## 2015-02-28 MED ORDER — FENTANYL CITRATE (PF) 100 MCG/2ML IJ SOLN: 25.0000 ug | INTRAMUSCULAR | Status: DC | PRN

## 2015-02-28 MED ORDER — ASPIRIN EC 81 MG PO TBEC
81.0000 mg | DELAYED_RELEASE_TABLET | Freq: Every day | ORAL | Status: DC
  Administered 2015-02-28 – 2015-03-03 (×4): 81 mg via ORAL
  Filled 2015-02-28 (×4): qty 1

## 2015-02-28 MED ORDER — SODIUM CHLORIDE 0.9 % IV SOLN: INTRAVENOUS | Status: DC

## 2015-02-28 MED ORDER — ONDANSETRON HCL 4 MG/2ML IJ SOLN
INTRAMUSCULAR | Status: DC | PRN
  Administered 2015-02-28: 4 mg via INTRAVENOUS

## 2015-02-28 MED ORDER — HEPARIN BOLUS VIA INFUSION
5000.0000 [IU] | Freq: Once | INTRAVENOUS | Status: AC
  Administered 2015-02-27: 5000 [IU] via INTRAVENOUS
  Filled 2015-02-28: qty 5000

## 2015-02-28 MED ORDER — FENTANYL CITRATE (PF) 100 MCG/2ML IJ SOLN
INTRAMUSCULAR | Status: AC
  Filled 2015-02-28: qty 2

## 2015-02-28 MED ORDER — LIDOCAINE HCL (PF) 1 % IJ SOLN
INTRAMUSCULAR | Status: DC | PRN
Start: 2015-02-28 — End: 2015-02-28
  Administered 2015-02-28: 5 mL via INTRADERMAL

## 2015-02-28 MED ORDER — LIDOCAINE HCL (CARDIAC) 20 MG/ML IV SOLN
INTRAVENOUS | Status: DC | PRN
  Administered 2015-02-28: 80 mg via INTRAVENOUS

## 2015-02-28 MED ORDER — NIFEDIPINE ER OSMOTIC RELEASE 90 MG PO TB24
90.0000 mg | ORAL_TABLET | Freq: Every day | ORAL | Status: DC
  Administered 2015-02-28 – 2015-03-03 (×4): 90 mg via ORAL
  Filled 2015-02-28 (×4): qty 1

## 2015-02-28 MED ORDER — PROPOFOL 10 MG/ML IV BOLUS
INTRAVENOUS | Status: DC | PRN
  Administered 2015-02-28: 100 mg via INTRAVENOUS

## 2015-02-28 MED ORDER — BUPIVACAINE-EPINEPHRINE 0.25% -1:200000 IJ SOLN
INTRAMUSCULAR | Status: DC | PRN
  Administered 2015-02-28: 9 mL

## 2015-02-28 MED ORDER — ONDANSETRON HCL 4 MG/2ML IJ SOLN: 4.0000 mg | Freq: Once | INTRAMUSCULAR | Status: DC | PRN

## 2015-02-28 MED ORDER — FENTANYL CITRATE (PF) 100 MCG/2ML IJ SOLN
INTRAMUSCULAR | Status: DC | PRN
  Administered 2015-02-28 (×2): 50 ug via INTRAVENOUS

## 2015-02-28 MED ORDER — HEPARIN SODIUM (PORCINE) 1000 UNIT/ML IJ SOLN
INTRAMUSCULAR | Status: DC | PRN
  Administered 2015-02-28: 2000 [IU] via INTRAVENOUS

## 2015-02-28 MED ORDER — SODIUM BICARBONATE BOLUS VIA INFUSION
INTRAVENOUS | Status: AC
  Administered 2015-02-28: 13:00:00 via INTRAVENOUS
  Filled 2015-02-28: qty 1

## 2015-02-28 MED ORDER — ATENOLOL 50 MG PO TABS
50.0000 mg | ORAL_TABLET | Freq: Every day | ORAL | Status: DC
  Administered 2015-02-28 – 2015-03-03 (×4): 50 mg via ORAL
  Filled 2015-02-28 (×4): qty 2

## 2015-02-28 MED ORDER — LIDOCAINE HCL (PF) 1 % IJ SOLN
INTRAMUSCULAR | Status: AC
  Filled 2015-02-28: qty 30

## 2015-02-28 MED ORDER — HEPARIN SODIUM (PORCINE) 5000 UNIT/ML IJ SOLN
4000.0000 [IU] | Freq: Once | INTRAMUSCULAR | Status: DC
  Filled 2015-02-28: qty 1

## 2015-02-28 SURGICAL SUPPLY — 41 items
BAG DECANTER FOR FLEXI CONT (MISCELLANEOUS) ×3 IMPLANT
BLADE SURG SZ11 CARB STEEL (BLADE) ×3 IMPLANT
BOOT SUTURE AID YELLOW STND (SUTURE) ×3 IMPLANT
BRUSH SCRUB 4% CHG (MISCELLANEOUS) ×3 IMPLANT
CATH EMBOLECTOMY 3X80 (CATHETERS) ×3 IMPLANT
DRESSING SURGICEL FIBRLLR 1X2 (HEMOSTASIS) IMPLANT
DRSG SURGICEL FIBRILLAR 1X2 (HEMOSTASIS)
ELECT CAUTERY BLADE 6.4 (BLADE) ×3 IMPLANT
ELECT REM PT RETURN 9FT ADLT (ELECTROSURGICAL) ×3
ELECTRODE REM PT RTRN 9FT ADLT (ELECTROSURGICAL) ×1 IMPLANT
GLOVE SURG SYN 8.0 (GLOVE) ×3 IMPLANT
GOWN STRL REUS W/ TWL LRG LVL3 (GOWN DISPOSABLE) ×1 IMPLANT
GOWN STRL REUS W/ TWL XL LVL3 (GOWN DISPOSABLE) ×1 IMPLANT
GOWN STRL REUS W/TWL LRG LVL3 (GOWN DISPOSABLE) ×2
GOWN STRL REUS W/TWL XL LVL3 (GOWN DISPOSABLE) ×2
IV NS 500ML (IV SOLUTION) ×2
IV NS 500ML BAXH (IV SOLUTION) ×1 IMPLANT
KIT RM TURNOVER STRD PROC AR (KITS) ×3 IMPLANT
LABEL OR SOLS (LABEL) ×3 IMPLANT
LIQUID BAND (GAUZE/BANDAGES/DRESSINGS) ×3 IMPLANT
LOOP RED MAXI  1X406MM (MISCELLANEOUS) ×4
LOOP VESSEL MAXI 1X406 RED (MISCELLANEOUS) ×2 IMPLANT
LOOP VESSEL MINI 0.8X406 BLUE (MISCELLANEOUS) ×1 IMPLANT
LOOPS BLUE MINI 0.8X406MM (MISCELLANEOUS) ×2
NEEDLE HYPO 25X1 1.5 SAFETY (NEEDLE) ×3 IMPLANT
NS IRRIG 500ML POUR BTL (IV SOLUTION) ×3 IMPLANT
PACK EXTREMITY ARMC (MISCELLANEOUS) ×3 IMPLANT
STOCKINETTE STRL 4IN 9604848 (GAUZE/BANDAGES/DRESSINGS) ×3 IMPLANT
SUT MNCRL 4-0 (SUTURE) ×2
SUT MNCRL 4-0 27XMFL (SUTURE) ×1
SUT PROLENE 6 0 BV (SUTURE) ×21 IMPLANT
SUT SILK 2 0 (SUTURE) ×2
SUT SILK 2-0 18XBRD TIE 12 (SUTURE) ×1 IMPLANT
SUT SILK 3 0 (SUTURE) ×2
SUT SILK 3-0 18XBRD TIE 12 (SUTURE) ×1 IMPLANT
SUT SILK 4 0 (SUTURE) ×2
SUT SILK 4-0 18XBRD TIE 12 (SUTURE) ×1 IMPLANT
SUT VIC AB 3-0 SH 27 (SUTURE) ×2
SUT VIC AB 3-0 SH 27X BRD (SUTURE) ×1 IMPLANT
SUTURE MNCRL 4-0 27XMF (SUTURE) ×1 IMPLANT
SYR 20CC LL (SYRINGE) ×3 IMPLANT

## 2015-02-28 SURGICAL SUPPLY — 19 items
CANISTER PENUMBRA MAX (MISCELLANEOUS) ×3 IMPLANT
CATH 5FR PIG 90CM (CATHETERS) ×3 IMPLANT
CATH H1 100CM (CATHETERS) ×3 IMPLANT
CATH INDIGO 5 ST-TIP 132CM (CATHETERS) ×3 IMPLANT
CATH INDIGO 8 XTORQ TIP 115CM (CATHETERS) ×3 IMPLANT
CATH STS 5FR 125CM (CATHETERS) ×3 IMPLANT
DEVICE STARCLOSE SE CLOSURE (Vascular Products) ×3 IMPLANT
GLIDEWIRE ANGLED SS 035X260CM (WIRE) ×3 IMPLANT
PACK ANGIOGRAPHY (CUSTOM PROCEDURE TRAY) ×3 IMPLANT
SET INTRO CAPELLA COAXIAL (SET/KITS/TRAYS/PACK) ×3 IMPLANT
SHEATH BRITE TIP 5FRX11 (SHEATH) ×3 IMPLANT
SHEATH BRITE TIP 8FRX11 (SHEATH) ×3 IMPLANT
SHEATH RAABE 8FRX55 (SHEATH) ×3 IMPLANT
SYR MEDRAD MARK V 150ML (SYRINGE) ×3 IMPLANT
TUBING ASPIRATION INDIGO (MISCELLANEOUS) ×3 IMPLANT
TUBING CONTRAST HIGH PRESS 72 (TUBING) ×3 IMPLANT
WIRE G V18X300CM (WIRE) ×3 IMPLANT
WIRE HI TORQ VERSACORE 300 (WIRE) ×3 IMPLANT
WIRE J 3MM .035X145CM (WIRE) ×3 IMPLANT

## 2015-02-28 NOTE — Op Note (Signed)
[facility]  Percutaneous Study/Intervention Procedural Note   Date of Surgery: 02/28/2015  Surgeon:Schnier, Nancy Hahn   Pre-operative Diagnosis: Occlusion left subclavian artery  Post-operative diagnosis:  Embolism which is occlusive left subclavian artery  Procedure(s) Performed:  1.  Arch aortogram  2.  Left upper extremity angiography third order catheter placement  3.  Infusion of TPA for thrombolysis  4.  Mechanical thrombectomy with the penumbra device    Anesthesia: Conscious sedation was administered under my direct supervision. IV Versed plus fentanyl were utilized. Continuous ECG, pulse oximetry and blood pressure was monitored throughout the entire procedure. A total of 3 milligrams of Versed and 100 micrograms of fentanyl were utilized.  Conscious sedation was begun at  1326 and concluded at 1439 for a total of 1 hour 13 minutes.  Sheath: 8 [place] Rabi right groin  Contrast: 50 cc  Fluoroscopy Time: 6.8 minutes  Indications:  Patient presented with the abrupt onset of pain in her left upper extremity. Physical examination demonstrates lack of pulses in the radial brachial and axillary positions. There is no history of A. fib or other cardiac arrhythmia. There is no history of hypercoagulable state. She is undergoing angiography to better delineate the clinical situation as well as attempt to resolve this using interventional techniques. The risks and benefits have been reviewed with the patient all questions of been answered patient agrees to proceed  Procedure:  Nancy Hahn a 82 y.o. female who was identified and appropriate procedural time out was performed.  The patient was then placed supine on the table and prepped and draped in the usual sterile fashion.  Ultrasound was used to evaluate the right common femoral artery.  It was patent .  A digital ultrasound image was acquired.  A micropuncture needle was used to access the right common  femoral artery under direct ultrasound guidance and a permanent image was performed. Micro-sheath was then inserted followed by a 0.035 J wire was advanced without resistance and a 5Fr sheath was placed.    Pigtail catheter was then advanced into the ascending aorta and LAO projection of the aortic arch was obtained. Stiff Glidewire was then used to exchange the pigtail catheter for an H1 catheter and the subclavian was selected. Initial injection by hand just past the origin of the vertebral and straight an obvious embolus which is nearly occlusive. Glidewire and catheter are then negotiated through the embolism into the brachial artery where distal runoff shows patency downstream.  Stiff wire was then reintroduced and the 5 French sheath was exchanged for an 8 [place] rabies. 3000 units of heparin was given.  6 mg of TPA was then administered directly into the clot and allowed to dwell. Subsequently the penumbra mechanical thrombectomy catheter was advanced through the clot. Multiple passes were made both the larger eighth [place] device as well as the smaller device were utilized unfortunately this did not seem to alter the thrombus burden suggesting a very chronic organized clot.  Given the options of further intervention possible and plasty and stent placement or open surgical embolectomy I believe surgical embolectomy is the most efficacious way to treat this and we'll move forward transferring her to the operating room suite is available.  Findings:   Aortogram:  Aortic arch with normal anatomy minimal evidence of atherosclerotic changes origins of all 3 great vessels are widely patent.   Left upper Extremity:  Obvious embolism noted in the distal subclavian proximal axillary. Distally the brachial artery is widely  patent as is the trifurcation the radial and ulnar arteries.    Disposition: Patient was taken to the recovery room in stable condition having tolerated the procedure  well.  Schnier, Nancy Hahn 02/28/2015,2:43 PM

## 2015-02-28 NOTE — Anesthesia Procedure Notes (Signed)
Procedure Name: LMA Insertion Date/Time: 02/28/2015 3:41 PM Performed by: Michaele Offer Pre-anesthesia Checklist: Patient identified, Emergency Drugs available, Suction available, Patient being monitored and Timeout performed Patient Re-evaluated:Patient Re-evaluated prior to inductionOxygen Delivery Method: Circle system utilized Preoxygenation: Pre-oxygenation with 100% oxygen Intubation Type: IV induction Ventilation: Mask ventilation without difficulty LMA: LMA inserted LMA Size: 3.5 Number of attempts: 1 Placement Confirmation: positive ETCO2 and breath sounds checked- equal and bilateral Dental Injury: Teeth and Oropharynx as per pre-operative assessment

## 2015-02-28 NOTE — ED Notes (Signed)
Pharmacy notified of heparin drip order, Matt from pharmacy verbally acknowledged understanding of order.

## 2015-02-28 NOTE — Progress Notes (Signed)
Sister with patient, Dr Gilda Crease Here to speak with pt and sister with questions answered, pt to go to or for embolectomy of left arm artery embolus. Consent for procedure signed per sister.

## 2015-02-28 NOTE — Anesthesia Preprocedure Evaluation (Addendum)
Anesthesia Evaluation  Patient identified by MRN, date of birth, ID band Patient awake    Reviewed: Allergy & Precautions, NPO status , Patient's Chart, lab work & pertinent test results, reviewed documented beta blocker date and time   Airway Mallampati: II  TM Distance: >3 FB     Dental  (+) Chipped, Lower Dentures, Upper Dentures   Pulmonary           Cardiovascular hypertension, Pt. on medications and Pt. on home beta blockers      Neuro/Psych    GI/Hepatic   Endo/Other  diabetes, Type 2  Renal/GU      Musculoskeletal   Abdominal   Peds  Hematology   Anesthesia Other Findings Neuropathy.  Reproductive/Obstetrics                            Anesthesia Physical Anesthesia Plan  ASA: III  Anesthesia Plan: General   Post-op Pain Management:    Induction: Intravenous  Airway Management Planned: LMA  Additional Equipment:   Intra-op Plan:   Post-operative Plan:   Informed Consent: I have reviewed the patients History and Physical, chart, labs and discussed the procedure including the risks, benefits and alternatives for the proposed anesthesia with the patient or authorized representative who has indicated his/her understanding and acceptance.     Plan Discussed with: CRNA  Anesthesia Plan Comments:         Anesthesia Quick Evaluation

## 2015-02-28 NOTE — ED Notes (Signed)
Spoke with lab about PT-INR and APTT add-on to blood. Lab verbally acknowledged order.

## 2015-02-28 NOTE — Progress Notes (Signed)
ANTICOAGULATION CONSULT NOTE - FOLLOW UP   Pharmacy Consult for heparin Indication: arterial clot  No Known Allergies  Patient Measurements: Height:  (172.7 cm) Weight: 195 lb (88.451 kg) IBW/kg (Calculated) : 63.9 Heparin Dosing Weight: 82.4 kg  Vital Signs: Temp: 99 F (37.2 C) (01/18 1739) Temp Source: Oral (01/18 1237) BP: 119/48 mmHg (01/18 1802) Pulse Rate: 69 (01/18 1802)  Labs:  Recent Labs  02/27/15 1957 02/28/15 0820  HGB 13.0  --   HCT 40.8  --   PLT 232  --   APTT 27  --   LABPROT 14.0  --   INR 1.06  --   HEPARINUNFRC  --  0.83*  CREATININE 1.27*  --   TROPONINI <0.03  --     Estimated Creatinine Clearance: 40.4 mL/min (by C-G formula based on Cr of 1.27).  Assessment: 33 yof with left arm pain around 1630, sister called ambulance. Denies CP/nausea/SOB. Arm feels heavy, no obvious neuro deficits. Pt had CT then pharmacy consulted to dose heparin for arterial clot.   Goal of Therapy:  Heparin level 0.3-0.7 units/ml Monitor platelets by anticoagulation protocol: Yes   Plan:  Give 5000 units bolus x 1 Start heparin infusion at 1400 units/hr Check anti-Xa level in 8 hours and daily while on heparin Continue to monitor H&H and platelets   1/18: Heparin level resulted @ 0.83. Will reduce heparin gtt rate by 2 units/kg/hr. New order for heparin gtt 1250 units/hr. Will order next Heparin level for 18:00.   1/18 Heparin level originally ordered at 1800 has been discontinued. Patient underwent open embolectomy of the left upper extremity. Per vascular surgeon, will resume heparin drip at 2100 tonight (3 hours after consult note entered) at the previous rate of 1250 units/hr with no bolus. Will check heparin level 4 hours after infusion begins at 0100 on 1/19 at which time we will resume dosing per protocol/nomogram. CBC ordered with AM labs tomorrow.  Cindi Carbon, Pharm.D. Clinical Pharmacist 02/28/2015,6:36 PM

## 2015-02-28 NOTE — Progress Notes (Signed)
[facility] [place] at [facility]                                                                                                                                                                                            Patient Demographics   Nancy Hahn, is a 82 y.o. female, DOB - Nov 22, 1931, ZOX:[id number]  Admit date - 02/27/2015   Admitting Physician Arnaldo Natal, MD  Outpatient Primary MD for the patient is BABAOFF, MARC E, MD   LOS - 0  Subjective: Complains of left hand being numb.     Review of Systems:   CONSTITUTIONAL: No documented fever. No fatigue, weakness. No weight gain, no weight loss.  EYES: No blurry or double vision.  ENT: No tinnitus. No postnasal drip. No redness of the oropharynx.  RESPIRATORY: No cough, no wheeze, no hemoptysis. No dyspnea.  CARDIOVASCULAR: No chest pain. No orthopnea. No palpitations. No syncope.  GASTROINTESTINAL: No nausea, no vomiting or diarrhea. No abdominal pain. No melena or hematochezia.  GENITOURINARY: No dysuria or hematuria.  ENDOCRINE: No polyuria or nocturia. No heat or cold intolerance.  HEMATOLOGY: No anemia. No bruising. No bleeding.  INTEGUMENTARY: No rashes. No lesions.  MUSCULOSKELETAL: No arthritis. No swelling. No gout.  NEUROLOGIC: Left hand numbness, tingling, or ataxia. No seizure-type activity.  PSYCHIATRIC: No anxiety. No insomnia. No ADD.    Vitals:   Filed Vitals:   02/28/15 1247 02/28/15 1445 02/28/15 1458 02/28/15 1500  BP: 134/64 136/60 129/58 129/58  Pulse:  60 65   Temp:      TempSrc:      Resp: Height:      Weight:      SpO2:  83% 39%     Wt Readings from Last 3 Encounters:  02/28/15 88.451 kg (195 lb)  05/27/12 91.173 kg (201 lb)     Intake/Output Summary (Last 24 hours) at 02/28/15 1522 Last data filed at 02/28/15 1151  Gross per 24 hour  Intake 101.42 ml  Output    500 ml  Net -398.58 ml    Physical Exam:   GENERAL:  Pleasant-appearing in no apparent distress.  HEAD, EYES, EARS, NOSE AND THROAT: Atraumatic, normocephalic. Extraocular muscles are intact. Pupils equal and reactive to light. Sclerae anicteric. No conjunctival injection. No oro-pharyngeal erythema.  NECK: Supple. There is no jugular venous distention. No bruits, no lymphadenopathy, no thyromegaly.  HEART: Regular rate and rhythm,. No murmurs, no rubs, no clicks.  LUNGS: Clear to auscultation bilaterally. No rales or rhonchi. No wheezes.  ABDOMEN: Soft, flat, nontender, nondistended. Has good bowel sounds. No  hepatosplenomegaly appreciated.  EXTREMITIES:  Left upper extremity appears pale are compared to the right extremity NEUROLOGIC: The patient is alert, awake, and oriented x3 with no focal motor or sensory deficits appreciated bilaterally.  SKIN: Moist and warm with no rashes appreciated.  Psych: Not anxious, depressed LN: No inguinal LN enlargement    Antibiotics   Anti-infectives    None      Medications   Scheduled Meds: . [MAR Hold] aspirin EC  81 mg Oral Daily  . [MAR Hold] atenolol  50 mg Oral Daily  . Chlorhexidine Gluconate Cloth  6 each Topical Once  . [MAR Hold] docusate sodium  100 mg Oral BID  . [MAR Hold] lisinopril  40 mg Oral Daily  . [MAR Hold] NIFEdipine  90 mg Oral Daily  . [MAR Hold] simvastatin  20 mg Oral Daily  . sodium bicarbonate (CIN) infusion   Intravenous to XRAY  . [MAR Hold] sodium chloride  3 mL Intravenous Q12H   Continuous Infusions: . sodium chloride 125 mL/hr at 02/28/15 1010  . heparin Stopped (02/28/15 1255)   PRN Meds:.[MAR Hold] acetaminophen **OR** [MAR Hold] acetaminophen, [MAR Hold]  morphine injection, [MAR Hold] ondansetron **OR** [MAR Hold] ondansetron (ZOFRAN) IV   Data Review:   Micro Results No results found for this or any previous visit (from the past 240 hour(s)).  Radiology Reports Dg Chest 2 View  02/27/2015  CLINICAL DATA:  Acute onset left arm pain at 4:30 p.m.  today. Initial encounter. EXAM: CHEST  2 VIEW COMPARISON:  None. FINDINGS: Calcified granuloma on the right is noted. The lungs are otherwise clear. Heart size is normal. No pneumothorax or pleural effusion. Thoracic spondylosis is identified. IMPRESSION: No acute abnormality. Electronically Signed   By: Drusilla Kanner M.D.   On: 02/27/2015 20:18   Ct Angio Up Extrem Left W/cm &/or Wo/cm  02/27/2015  CLINICAL DATA:  82 year old female with left arm pain and heaviness. EXAM: CT ANGIOGRAPHY UPPER LEFT EXTREMITY TECHNIQUE: Transaxial CT of the left upper extremity from the level of the clavicle technique timed was performed in 3 mm slice thickness. Coronal and sagittal reformatted images were provided. The study was performed following intravenous administration of 75 cc Omnipaque 350 CONTRAST:  75mL OMNIPAQUE IOHEXOL 350 MG/ML SOLN COMPARISON:  Go technique FINDINGS: Evaluation of the distal extremity vasculature is limited due to streak artifact caused by patient's body. The aortic arch and the origins of the great vessels appear unremarkable. There is segmental wall thickening with luminal narrowing of the left subclavian artery extending into the left axillary artery and proximal brachial artery. There is high-grade narrowing of the subclavian and axillary segment of the vessel. The distal brachial artery is patent. There is bifurcation of the brachial artery approximately 4.5 cm distal to the elbow. The proximal portion of the radial and ulnar artery appear patent. Evaluation of the distal portion of this vessel is very limited due to suboptimal opacification. There is however apparent diminished flow within the distal radial and ulna are artery concerning for underlying vascular disease. Correlation with clinical exam and duplex ultrasound recommended. The soft tissues of the left upper extremity appear unremarkable. There is no left axillary adenopathy. Linear left lung base atelectatic changes/scarring  noted. A 13 mm indeterminate left adrenal nodule. Multiple left renal hypodense lesions, incompletely characterized, possibly parenchymal and parapelvic cysts. Ultrasound may provide better evaluation. There is diverticulosis of the visualized colon. No acute fracture. Review of the MIP images confirms the above findings. IMPRESSION: Segmental narrowing  of the left subclavian, axillary, and proximal brachial arteries. Limited evaluation of the mid and distal portion of the radial and ulna arteries due to suboptimal opacification concerning for diminished flow related to underlying peripheral vascular disease. Correlation with clinical exam and further evaluation with duplex ultrasound recommended. Electronically Signed   By: Elgie Collard M.D.   On: 02/27/2015 23:55     CBC  Recent Labs Lab 02/27/15 1957  WBC 13.8*  HGB 13.0  HCT 40.8  PLT 232  MCV 92.6  MCH 29.4  MCHC 31.8*  RDW 14.2    Chemistries   Recent Labs Lab 02/27/15 1957  NA 144  K 4.7  CL 103  CO2 26  GLUCOSE 116*  BUN 28*  CREATININE 1.27*  CALCIUM 10.4*   ------------------------------------------------------------------------------------------------------------------ estimated creatinine clearance is 40.4 mL/min (by C-G formula based on Cr of 1.27). ------------------------------------------------------------------------------------------------------------------ No results for input(s): HGBA1C in the last 72 hours. ------------------------------------------------------------------------------------------------------------------ No results for input(s): CHOL, HDL, LDLCALC, TRIG, CHOLHDL, LDLDIRECT in the last 72 hours. ------------------------------------------------------------------------------------------------------------------  Recent Labs  02/27/15 1957  TSH 4.496   ------------------------------------------------------------------------------------------------------------------ No results for  input(s): VITAMINB12, FOLATE, FERRITIN, TIBC, IRON, RETICCTPCT in the last 72 hours.  Coagulation profile  Recent Labs Lab 02/27/15 1957  INR 1.06    No results for input(s): DDIMER in the last 72 hours.  Cardiac Enzymes  Recent Labs Lab 02/27/15 1957  TROPONINI <0.03   ------------------------------------------------------------------------------------------------------------------ Invalid input(s): POCBNP    Assessment & Plan   This is an 82 year old Caucasian female admitted for arterial thrombus of the left upper extremity. 1. Arterial thrombus: Suspected. Patient undergoing arteriogram currently. Continue heparin drip 2. Essential hypertension: Continue atenolol, lisinopril and nifedipine.  3. Hyperlipidemia: Continue Zocor 4. DVT prophylaxis: As above 5. GI prophylaxis: None as the patient is not critically ill     Code Status Orders        Start     Ordered   02/28/15 0208  Full code   Continuous     02/28/15 0208    Code Status History    Date Active Date Inactive Code Status Order ID Comments User Context   This patient has a current code status but no historical code status.    Advance Directive Documentation        Most Recent Value   Type of Advance Directive  Healthcare Power of Attorney   Pre-existing out of facility DNR order (yellow form or pink MOST form)     "MOST" Form in Place?             Consults  vascular consult DVT Prophylaxis  heparin   Lab Results  Component Value Date   PLT 232 02/27/2015     Time Spent in minutes  45 minutes Auburn Bilberry M.D on 02/28/2015 at 3:22 PM  Between 7am to 6pm - Pager - [phone]  After 6pm go to [email or web address] - password [email or web address] [facility]  [facility] [place] [facility]   Office  [phone]

## 2015-02-28 NOTE — Progress Notes (Signed)
ANTICOAGULATION CONSULT NOTE - FOLLOW UP   Pharmacy Consult for heparin Indication: arterial clot  No Known Allergies  Patient Measurements: Height:  (172.7 cm) Weight: 195 lb 12.8 oz (88.814 kg) IBW/kg (Calculated) : 63.9 Heparin Dosing Weight: 82.9 kg  Vital Signs: Temp: 98.1 F (36.7 C) (01/18 0929) Temp Source: Oral (01/18 0929) BP: 134/61 mmHg (01/18 0929) Pulse Rate: 65 (01/18 0929)  Labs:  Recent Labs  02/27/15 1957 02/28/15 0820  HGB 13.0  --   HCT 40.8  --   PLT 232  --   APTT 27  --   LABPROT 14.0  --   INR 1.06  --   HEPARINUNFRC  --  0.83*  CREATININE 1.27*  --   TROPONINI <0.03  --     Estimated Creatinine Clearance: 40.5 mL/min (by C-G formula based on Cr of 1.27).   Medical History: Past Medical History  Diagnosis Date  . Breast mass   . Hypertension     Medications:  Infusions:  . sodium chloride 125 mL/hr at 02/28/15 0227  . heparin 1,400 Units/hr (02/28/15 0227)    Assessment: 83 yof with left arm pain around 1630, sister called ambulance. Denies CP/nausea/SOB. Arm feels heavy, no obvious neuro deficits. Pt had CT then pharmacy consulted to dose heparin for arterial clot.   Goal of Therapy:  Heparin level 0.3-0.7 units/ml Monitor platelets by anticoagulation protocol: Yes   Plan:  Give 5000 units bolus x 1 Start heparin infusion at 1400 units/hr Check anti-Xa level in 8 hours and daily while on heparin Continue to monitor H&H and platelets   1/18: Heparin level resulted @ 0.83. Will reduce heparin gtt rate by 2 units/kg/hr. New order for heparin gtt 1250 units/hr. Will order next Heparin level for 18:00.   Nancy Hahn D, Pharm.D., BCPS Clinical Pharmacist 02/28/2015,9:39 AM

## 2015-02-28 NOTE — Op Note (Signed)
    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 Travis [place]  ANESTHESIA: general  ESTIMATED BLOOD LOSS: 50 cc  FINDING(S): Thrombotic material recovered from the proximal arterial system  SPECIMEN(S):  Thromboembolus to pathology  INDICATIONS:   Nancy Hahn is a 81 y.o. female who presents with painful ischemia of the left upper extremity.  DESCRIPTION: After full informed written consent was obtained from the patient, the patient was brought back to the operating room and placed supine upon the operating table.  Prior to induction, the patient received IV antibiotics.   After obtaining adequate anesthesia, the patient was then prepped and draped in the standard fashion for a  left arm embolectomy.  The left arm was extended, linear incision was created overlying the brachial artery at the level of the antecubital fossa. The dissection was then carried down through the soft tissues to expose the radial artery. Vessel loops were then passed proximally and distally.  3000  Units of heparin was given and allowed to circulate.  Transverse arteriotomy was then made with an 11 blade scalpel. A #3 Fogarty catheter was then passed proximally. Multiple passes were made until no further thrombotic material was returned. Forward bleeding was excellent. The artery was then irrigated with heparinized saline. Attention was then turned to the distal aspect where again several passes were made with the Fogarty ensuring no embolic material was present and then the artery was irrigated with heparinized saline.  The brachial artery was then closed using interrupted 6-0 Prolene.  Flushing maneuvers were performed and flow was reestablished distally. The wound was then inspected and  additional 6-0 Prolene sutures were utilized as needed. The wound was then irrigated Surgicel was placed in the bed of the wound over the suture line and the wound was reapproximated with interrupted 3-0 Vicryl for the subcutaneous layer and 4-0 Monocryl was used to close the skin in a subcuticular.  The patient tolerated this procedure well.   COMPLICATIONS: None  CONDITION: [facility]  Office: [phone]   02/28/2015, 5:18 PM

## 2015-02-28 NOTE — Progress Notes (Signed)
ANTICOAGULATION CONSULT NOTE - Initial Consult  Pharmacy Consult for heparin Indication: arterial clot  No Known Allergies  Patient Measurements: Height:  (172.7 cm) Weight: 198 lb (89.812 kg) IBW/kg (Calculated) : 63.9 Heparin Dosing Weight: 82.9 kg  Vital Signs: Temp: 97.4 F (36.3 C) (01/17 1946) Temp Source: Oral (01/17 1946) BP: 145/86 mmHg (01/17 1946) Pulse Rate: 70 (01/17 1946)  Labs:  Recent Labs  02/27/15 1957  HGB 13.0  HCT 40.8  PLT 232  CREATININE 1.27*  TROPONINI <0.03    Estimated Creatinine Clearance: 40.7 mL/min (by C-G formula based on Cr of 1.27).   Medical History: Past Medical History  Diagnosis Date  . Breast mass   . Hypertension     Medications:  Infusions:  . heparin      Assessment: 83 yof with left arm pain around 1630, sister called ambulance. Denies CP/nausea/SOB. Arm feels heavy, no obvious neuro deficits. Pt had CT then pharmacy consulted to dose heparin for arterial clot.   Goal of Therapy:  Heparin level 0.3-0.7 units/ml Monitor platelets by anticoagulation protocol: Yes   Plan:  Give 5000 units bolus x 1 Start heparin infusion at 1400 units/hr Check anti-Xa level in 8 hours and daily while on heparin Continue to monitor H&H and platelets  Carola Frost, Pharm.D., BCPS Clinical Pharmacist 02/28/2015,12:26 AM

## 2015-02-28 NOTE — ED Notes (Signed)
Left Radial pulse felt per Dr.Goodman at bedside.

## 2015-02-28 NOTE — Consult Note (Signed)
[facility] Vascular Consult Note  MRN : [id number]  Nancy Hahn is a 82 y.o. (1931/05/01) female who presents with chief complaint of  Chief Complaint  Patient presents with  . Arm Pain  . Numbness  .  History of Present Illness: Patient is an 82 year old white female who I am asked to see for evaluation of left upper extremity pain and coolness. The emergency department contacted me and she was admitted to the hospitalist service on anticoagulation. That has improved but not resolved her symptoms. Her left hand is still pretty numb and cool. The pain is better. This started abruptly about 8 hours ago. She reports no antecedent history of trauma or injury. She has no fever or chills or signs of systemic infection. Initially it started with weakness in the arm that was abrupt and then was followed a few minutes later by severe pain. She has no right arm symptoms or leg symptoms. She did not notice any heart abnormalities were palpitations and does not have a documented history of atrial fibrillation.  Current Facility-Administered Medications  Medication Dose Route Frequency Provider Last Rate Last Dose  . 0.9 %  sodium chloride infusion   Intravenous Continuous Arnaldo Natal, MD 125 mL/hr at 02/28/15 0227    . acetaminophen (TYLENOL) tablet 650 mg  650 mg Oral Q6H PRN Arnaldo Natal, MD       Or  . acetaminophen (TYLENOL) suppository 650 mg  650 mg Rectal Q6H PRN Arnaldo Natal, MD      . aspirin EC tablet 81 mg  81 mg Oral Daily Arnaldo Natal, MD      . atenolol (TENORMIN) tablet 50 mg  50 mg Oral Daily Arnaldo Natal, MD      . docusate sodium (COLACE) capsule 100 mg  100 mg Oral BID Arnaldo Natal, MD   100 mg at 02/28/15 6962  . heparin ADULT infusion 100 units/mL (25000 units/250 mL)  1,400 Units/hr Intravenous Continuous Phineas Semen, MD 14 mL/hr at 02/28/15 0227 1,400 Units/hr at 02/28/15 0227  . lisinopril (PRINIVIL,ZESTRIL) tablet 40  mg  40 mg Oral Daily Arnaldo Natal, MD      . morphine 2 MG/ML injection 2 mg  2 mg Intravenous Q4H PRN Arnaldo Natal, MD      . NIFEdipine (PROCARDIA XL/ADALAT-CC) 24 hr tablet 90 mg  90 mg Oral Daily Arnaldo Natal, MD      . ondansetron [facility]) tablet 4 mg  4 mg Oral Q6H PRN Arnaldo Natal, MD       Or  . ondansetron [facility]) injection 4 mg  4 mg Intravenous Q6H PRN Arnaldo Natal, MD      . simvastatin (ZOCOR) tablet 20 mg  20 mg Oral Daily Arnaldo Natal, MD      . sodium chloride 0.9 % injection 3 mL  3 mL Intravenous Q12H Arnaldo Natal, MD   3 mL at 02/28/15 0227    Past Medical History  Diagnosis Date  . Breast mass   . Hypertension     Past Surgical History  Procedure Laterality Date  . Breast surgery  1980    biopsy  . Dilation and curettage of uterus    . Foot surgery      Social History Social History  Substance Use Topics  . Smoking status: Never Smoker   . Smokeless tobacco: Never Used  . Alcohol Use: No  No IVDU  Family  History Family History  Problem Relation Age of Onset  . Emphysema Father   . Cancer Brother     lung  . Cancer Sister     breast  . Cancer Maternal Aunt     ovarian  . Diabetes type II Mother   . Diabetes type II Sister     x3   No Known Allergies   REVIEW OF SYSTEMS (Negative unless checked)  Constitutional: Weight loss  Fever  Chills Cardiac: Chest pain   Chest pressure   Palpitations   Shortness of breath when laying flat   Shortness of breath at rest   Shortness of breath with exertion. Vascular:  Pain in legs with walking   Pain in legs at rest   Pain in legs when laying flat   Claudication   Pain in feet when walking  Pain in feet at rest  Pain in feet when laying flat   History of DVT   Phlebitis   Swelling in legs   Varicose veins   Non-healing ulcers Pulmonary:   Uses home oxygen   Productive cough   Hemoptysis   Wheeze  COPD    Asthma Neurologic:  Dizziness  Blackouts   Seizures   History of stroke   History of TIA  Aphasia   Temporary blindness   Dysphagia   Weakness or numbness in arms   Weakness or numbness in legs Musculoskeletal:  Arthritis   Joint swelling   Joint pain   Low back pain Hematologic:  Easy bruising  Easy bleeding   Hypercoagulable state   Anemic  Hepatitis Gastrointestinal:  Blood in stool   Vomiting blood  Gastroesophageal reflux/heartburn   Difficulty swallowing. Genitourinary:  Chronic kidney disease   Difficult urination  Frequent urination  Burning with urination   Blood in urine Skin:  Rashes   Ulcers   Wounds Psychological:  History of anxiety    History of major depression.  Physical Examination  Filed Vitals:   02/27/15 1946 02/28/15 0134 02/28/15 0209 02/28/15 0519  BP: 145/86 144/66 165/62 140/62  Pulse: 70 68 76 65  Temp: 97.4 F (36.3 C)  97.3 F (36.3 C) 98.3 F (36.8 C)  TempSrc: Oral  Oral Oral  Resp: Height:  (1.727 m)     Weight: 89.812 kg (198 lb)  88.814 kg (195 lb 12.8 oz)   SpO2: 100% 94% 100% 98%   Body mass index is 29.78 kg/(m^2). Gen:  WD/WN, NAD Head: [place]/AT, No temporalis wasting. Prominent temp pulse not noted. Ear/Nose/Throat: Hearing grossly intact, nares w/o erythema or drainage, oropharynx w/o Erythema/Exudate Eyes: PERRLA, EOMI.  Neck: Supple, no nuchal rigidity.  No bruit or JVD.  Pulmonary:  Good air movement, clear to auscultation bilaterally.  Cardiac: RRR, normal S1, S2, no Murmurs, rubs or gallops. Vascular: LUE cool, slow cap refill Vessel Right Left  Radial Palpable Not Palpable  Ulnar Palpable Not Palpable  Brachial Palpable Not Palpable  Carotid Palpable, without bruit Palpable, without bruit  Aorta Not palpable N/A  Femoral Palpable Palpable  Popliteal Palpable Palpable  PT Palpable Palpable  DP Palpable Palpable   Gastrointestinal: soft,  non-tender/non-distended. No guarding/reflex. No masses, surgical incisions, or scars. Musculoskeletal: M/S 5/5 throughout.  Extremities without ischemic changes.  No deformity or atrophy. No edema. Neurologic: CN 2-12 intact. Left upper extremity sensation decreased int he hand.  Symmetrical.  Speech is fluent. Motor exam as listed above. Psychiatric: Judgment intact, Mood & affect appropriate for pt's  clinical situation. Dermatologic: No rashes or ulcers noted.  No cellulitis or open wounds. Lymph : No Cervical, Axillary, or Inguinal lymphadenopathy.    CBC Lab Results  Component Value Date   WBC 13.8* 02/27/2015   HGB 13.0 02/27/2015   HCT 40.8 02/27/2015   MCV 92.6 02/27/2015   PLT 232 02/27/2015    BMET    Component Value Date/Time   NA 144 02/27/2015 1957   NA 140 02/26/2014 1327   K 4.7 02/27/2015 1957   K 4.6 02/26/2014 1327   CL 103 02/27/2015 1957   CL 107 02/26/2014 1327   CO2 26 02/27/2015 1957   CO2 25 02/26/2014 1327   GLUCOSE 116* 02/27/2015 1957   GLUCOSE 125* 02/26/2014 1327   BUN 28* 02/27/2015 1957   BUN 22* 02/26/2014 1327   CREATININE 1.27* 02/27/2015 1957   CREATININE 1.19 02/26/2014 1327   CALCIUM 10.4* 02/27/2015 1957   CALCIUM 8.9 02/26/2014 1327   GFRNONAA 38* 02/27/2015 1957   GFRNONAA 46* 02/26/2014 1327   GFRAA 45* 02/27/2015 1957   GFRAA 56* 02/26/2014 1327   Estimated Creatinine Clearance: 40.5 mL/min (by C-G formula based on Cr of 1.27).  COAG Lab Results  Component Value Date   INR 1.06 02/27/2015    Radiology Dg Chest 2 View  02/27/2015  CLINICAL DATA:  Acute onset left arm pain at 4:30 p.m. today. Initial encounter. EXAM: CHEST  2 VIEW COMPARISON:  None. FINDINGS: Calcified granuloma on the right is noted. The lungs are otherwise clear. Heart size is normal. No pneumothorax or pleural effusion. Thoracic spondylosis is identified. IMPRESSION: No acute abnormality. Electronically Signed   By: Drusilla Kanner M.D.   On: 02/27/2015  20:18   Ct Angio Up Extrem Left W/cm &/or Wo/cm  02/27/2015  CLINICAL DATA:  82 year old female with left arm pain and heaviness. EXAM: CT ANGIOGRAPHY UPPER LEFT EXTREMITY TECHNIQUE: Transaxial CT of the left upper extremity from the level of the clavicle technique timed was performed in 3 mm slice thickness. Coronal and sagittal reformatted images were provided. The study was performed following intravenous administration of 75 cc Omnipaque 350 CONTRAST:  75mL OMNIPAQUE IOHEXOL 350 MG/ML SOLN COMPARISON:  Go technique FINDINGS: Evaluation of the distal extremity vasculature is limited due to streak artifact caused by patient's body. The aortic arch and the origins of the great vessels appear unremarkable. There is segmental wall thickening with luminal narrowing of the left subclavian artery extending into the left axillary artery and proximal brachial artery. There is high-grade narrowing of the subclavian and axillary segment of the vessel. The distal brachial artery is patent. There is bifurcation of the brachial artery approximately 4.5 cm distal to the elbow. The proximal portion of the radial and ulnar artery appear patent. Evaluation of the distal portion of this vessel is very limited due to suboptimal opacification. There is however apparent diminished flow within the distal radial and ulna are artery concerning for underlying vascular disease. Correlation with clinical exam and duplex ultrasound recommended. The soft tissues of the left upper extremity appear unremarkable. There is no left axillary adenopathy. Linear left lung base atelectatic changes/scarring noted. A 13 mm indeterminate left adrenal nodule. Multiple left renal hypodense lesions, incompletely characterized, possibly parenchymal and parapelvic cysts. Ultrasound may provide better evaluation. There is diverticulosis of the visualized colon. No acute fracture. Review of the MIP images confirms the above findings. IMPRESSION: Segmental  narrowing of the left subclavian, axillary, and proximal brachial arteries. Limited evaluation of the mid and  distal portion of the radial and ulna arteries due to suboptimal opacification concerning for diminished flow related to underlying peripheral vascular disease. Correlation with clinical exam and further evaluation with duplex ultrasound recommended. Electronically Signed   By: Elgie Collard M.D.   On: 02/27/2015 23:55     Assessment/Plan 1. LUE ischemia, acute.  Likely embolic but could be an atherosclerotic lesion that thrombosed.  Discussed options with the patient including surgical therapy for embolectomy, angiogram for full evaluation and potential treatment, and continued anticoagulation alone. Given her continued symptoms on heparin, even though they're better, I do not believe anticoagulation alone is appropriate. We discussed that angiogram is less invasive and may provide more information in her situation to allow treatment. Risks and benefits were discussed and she desires to proceed. It is discussed that this is a critically limb threatening situation.   DEW,JASON, MD  02/28/2015 7:58 AM

## 2015-02-28 NOTE — H&P (Signed)
Nancy Hahn is an 82 y.o. female.   Chief Complaint: Arm pain  HPI: The patient presents emergency department complaining of pain in her left upper extremity. She states that it began in the shower after she had completed her housework approximately 10 hours prior to admission. At first she states her arm was very painful but then became numb. She denies trouble swallowing, speaking or ambulating. She has had no visual symptoms. She also denies headache, chest pain, palpitations, nausea or vomiting. In the emergency department she was found to have cyanotic appearing fingers of the left hand which was also cold to touch. Vascular surgery was consulted who recommended beginning heparin in the emergency department staff called for admission.  Past Medical History  Diagnosis Date  . Breast mass   . Hypertension     Past Surgical History  Procedure Laterality Date  . Breast surgery  1980    biopsy  . Dilation and curettage of uterus    . Foot surgery      Family History  Problem Relation Age of Onset  . Emphysema Father   . Cancer Brother     lung  . Cancer Sister     breast  . Cancer Maternal Aunt     ovarian  . Diabetes type II Mother   . Diabetes type II Sister     x3   Social History:  reports that she has never smoked. She has never used smokeless tobacco. She reports that she does not drink alcohol or use illicit drugs.  Allergies: No Known Allergies  Prior to Admission medications   Medication Sig Start Date End Date Taking? Authorizing Provider  aspirin 81 MG tablet Take 81 mg by mouth daily.   Yes Historical Provider, MD  atenolol (TENORMIN) 50 MG tablet Take 50 mg by mouth daily. 04/07/12   Historical Provider, MD  lisinopril (PRINIVIL,ZESTRIL) 40 MG tablet Take 40 mg by mouth daily. 04/06/12   Historical Provider, MD  NIFEdipine (PROCARDIA XL/ADALAT-CC) 90 MG 24 hr tablet Take 90 mg by mouth daily. 04/06/12   Historical Provider, MD  simvastatin (ZOCOR) 20 MG tablet  Take 20 mg by mouth daily. 04/07/12   Historical Provider, MD     Results for orders placed or performed during the hospital encounter of 02/27/15 (from the past 48 hour(s))  Basic metabolic panel     Status: Abnormal   Collection Time: 02/27/15  7:57 PM  Result Value Ref Range   Sodium 144 135 - 145 mmol/L   Potassium 4.7 3.5 - 5.1 mmol/L   Chloride 103 101 - 111 mmol/L   CO2 26 22 - 32 mmol/L   Glucose, Bld 116 (H) 65 - 99 mg/dL   BUN 28 (H) 6 - 20 mg/dL   Creatinine, Ser 1.27 (H) 0.44 - 1.00 mg/dL   Calcium 10.4 (H) 8.9 - 10.3 mg/dL   GFR calc non Af Amer 38 (L) >60 mL/min   GFR calc Af Amer 45 (L) >60 mL/min    Comment: (NOTE) The eGFR has been calculated using the CKD EPI equation. This calculation has not been validated in all clinical situations. eGFR's persistently <60 mL/min signify possible Chronic Kidney Disease.    Anion gap 15 5 - 15  CBC     Status: Abnormal   Collection Time: 02/27/15  7:57 PM  Result Value Ref Range   WBC 13.8 (H) 3.6 - 11.0 K/uL   RBC 4.41 3.80 - 5.20 MIL/uL   Hemoglobin 13.0 12.0 -  16.0 g/dL   HCT 40.8 35.0 - 47.0 %   MCV 92.6 80.0 - 100.0 fL   MCH 29.4 26.0 - 34.0 pg   MCHC 31.8 (L) 32.0 - 36.0 g/dL   RDW 14.2 11.5 - 14.5 %   Platelets 232 150 - 440 K/uL  Troponin I     Status: None   Collection Time: 02/27/15  7:57 PM  Result Value Ref Range   Troponin I <0.03 <0.031 ng/mL    Comment:        NO INDICATION OF MYOCARDIAL INJURY.   APTT     Status: None   Collection Time: 02/27/15  7:57 PM  Result Value Ref Range   aPTT 27 24 - 36 seconds  Protime-INR     Status: None   Collection Time: 02/27/15  7:57 PM  Result Value Ref Range   Prothrombin Time 14.0 11.4 - 15.0 seconds   INR 1.06    Dg Chest 2 View  02/27/2015  CLINICAL DATA:  Acute onset left arm pain at 4:30 p.m. today. Initial encounter. EXAM: CHEST  2 VIEW COMPARISON:  None. FINDINGS: Calcified granuloma on the right is noted. The lungs are otherwise clear. Heart size is  normal. No pneumothorax or pleural effusion. Thoracic spondylosis is identified. IMPRESSION: No acute abnormality. Electronically Signed   By: Inge Rise M.D.   On: 02/27/2015 20:18   Ct Angio Up Extrem Left W/cm &/or Wo/cm  02/27/2015  CLINICAL DATA:  82 year old female with left arm pain and heaviness. EXAM: CT ANGIOGRAPHY UPPER LEFT EXTREMITY TECHNIQUE: Transaxial CT of the left upper extremity from the level of the clavicle technique timed was performed in 3 mm slice thickness. Coronal and sagittal reformatted images were provided. The study was performed following intravenous administration of 75 cc Omnipaque 350 CONTRAST:  82m OMNIPAQUE IOHEXOL 350 MG/ML SOLN COMPARISON:  Go technique FINDINGS: Evaluation of the distal extremity vasculature is limited due to streak artifact caused by patient's body. The aortic arch and the origins of the great vessels appear unremarkable. There is segmental wall thickening with luminal narrowing of the left subclavian artery extending into the left axillary artery and proximal brachial artery. There is high-grade narrowing of the subclavian and axillary segment of the vessel. The distal brachial artery is patent. There is bifurcation of the brachial artery approximately 4.5 cm distal to the elbow. The proximal portion of the radial and ulnar artery appear patent. Evaluation of the distal portion of this vessel is very limited due to suboptimal opacification. There is however apparent diminished flow within the distal radial and ulna are artery concerning for underlying vascular disease. Correlation with clinical exam and duplex ultrasound recommended. The soft tissues of the left upper extremity appear unremarkable. There is no left axillary adenopathy. Linear left lung base atelectatic changes/scarring noted. A 13 mm indeterminate left adrenal nodule. Multiple left renal hypodense lesions, incompletely characterized, possibly parenchymal and parapelvic cysts.  Ultrasound may provide better evaluation. There is diverticulosis of the visualized colon. No acute fracture. Review of the MIP images confirms the above findings. IMPRESSION: Segmental narrowing of the left subclavian, axillary, and proximal brachial arteries. Limited evaluation of the mid and distal portion of the radial and ulna arteries due to suboptimal opacification concerning for diminished flow related to underlying peripheral vascular disease. Correlation with clinical exam and further evaluation with duplex ultrasound recommended. Electronically Signed   By: AAnner CreteM.D.   On: 02/27/2015 23:55    Review of Systems  Constitutional: Negative  for fever and chills.  HENT: Negative for sore throat and tinnitus.   Eyes: Negative for blurred vision and redness.  Respiratory: Negative for cough and shortness of breath.   Cardiovascular: Negative for chest pain, palpitations, orthopnea and PND.  Gastrointestinal: Negative for nausea, vomiting, abdominal pain and diarrhea.  Genitourinary: Negative for dysuria, urgency and frequency.  Musculoskeletal: Negative for myalgias and joint pain.  Skin: Negative for rash.       No lesions  Neurological: Positive for sensory change and weakness. Negative for speech change and focal weakness.  Endo/Heme/Allergies: Does not bruise/bleed easily.       No temperature intolerance  Psychiatric/Behavioral: Negative for depression and suicidal ideas.    Blood pressure 144/66, pulse 68, temperature 97.4 F (36.3 C), temperature source Oral, resp. rate 15, height _0  (1.727 m), weight 89.812 kg (198 lb), SpO2 94 %. Physical Exam  Nursing note and vitals reviewed. Constitutional: She is oriented to person, place, and time. She appears well-developed and well-nourished. No distress.  HENT:  Head: Normocephalic and atraumatic.  Mouth/Throat: Oropharynx is clear and moist.  Eyes: Conjunctivae and EOM are normal. Pupils are equal, round, and reactive  to light. No scleral icterus.  Neck: Normal range of motion. Neck supple. No JVD present. Carotid bruit is not present. No tracheal deviation present. No thyromegaly present.  Cardiovascular: Normal rate, regular rhythm and normal heart sounds.  Exam reveals no gallop and no friction rub.   No murmur heard. Respiratory: Effort normal and breath sounds normal.  GI: Soft. Bowel sounds are normal. She exhibits no distension. There is no tenderness.  Genitourinary:  Deferred  Musculoskeletal: Normal range of motion. She exhibits no edema.  1+ radial and altered pulses present  Lymphadenopathy:    She has no cervical adenopathy.  Neurological: She is alert and oriented to person, place, and time. No cranial nerve deficit. She exhibits normal muscle tone.  Skin: Skin is warm and dry. No rash noted. No erythema.  Psychiatric: She has a normal mood and affect. Her behavior is normal. Judgment and thought content normal.     Assessment/Plan This is an 82 year old Caucasian female admitted for arterial thrombus of the left upper extremity. 1. Arterial thrombus: Suspected. CTA shows segmental harrowing of the left subclavian, axillary and proximal brachial artery. Patient currently has 1+ pulses in the wrist of the left hand. She states the pain is dramatically improved. There is some mild cyanosis of the fingertips. She has no neurologic weakness. The patient has a history of benign breast mass but strong family history of cancer. She may be hypercoagulable. No atrial fibrillation on telemetry nor does she have a history of arrhythmia however I will order an echocardiogram to rule out cardiac thrombus. Continue heparin and aspirin. 2. Essential hypertension: Continue atenolol, lisinopril and nifedipine. (The latter raises concern that the patient may have had a history of paroxysmal atrial fibrillation). 3. Hyperlipidemia: Continue Zocor 4. DVT prophylaxis: As above 5. GI prophylaxis: None as the  patient is not critically ill The patient is a full code. Time spent on admission orders and patient care approximately 45 minutes.  Harrie Foreman 02/28/2015, 2:07 AM

## 2015-02-28 NOTE — Transfer of Care (Signed)
Immediate Anesthesia Transfer of Care Note  Patient: Nancy Hahn  Procedure(s) Performed: Procedure(s): EMBOLECTOMY  (Left)  Patient Location: PACU  Anesthesia Type:General  Level of Consciousness: awake, alert , oriented and patient cooperative  Airway & Oxygen Therapy: Patient Spontanous Breathing and Patient connected to face mask oxygen  Post-op Assessment: Report given to RN, Post -op Vital signs reviewed and stable and Patient moving all extremities X 4  Post vital signs: Reviewed and stable  Last Vitals:  Filed Vitals:   02/28/15 1458 02/28/15 1500  BP: 129/58 129/58  Pulse: 65   Temp:    Resp: 19 17    Complications: No apparent anesthesia complications

## 2015-03-01 ENCOUNTER — Encounter: Payer: Self-pay | Admitting: Vascular Surgery

## 2015-03-01 LAB — RENAL FUNCTION PANEL
ALBUMIN: 3.3 g/dL — AB (ref 3.5–5.0)
ANION GAP: 4 — AB (ref 5–15)
BUN: 20 mg/dL (ref 6–20)
CO2: 24 mmol/L (ref 22–32)
Calcium: 8.1 mg/dL — ABNORMAL LOW (ref 8.9–10.3)
Chloride: 111 mmol/L (ref 101–111)
Creatinine, Ser: 1.23 mg/dL — ABNORMAL HIGH (ref 0.44–1.00)
GFR calc Af Amer: 46 mL/min — ABNORMAL LOW (ref >60)
GFR, EST NON AFRICAN AMERICAN: 40 mL/min — AB (ref >60)
GLUCOSE: 144 mg/dL — AB (ref 65–99)
PHOSPHORUS: 3.4 mg/dL (ref 2.5–4.6)
POTASSIUM: 3.8 mmol/L (ref 3.5–5.1)
Sodium: 139 mmol/L (ref 135–145)

## 2015-03-01 LAB — CBC WITH DIFFERENTIAL/PLATELET
BASOS ABS: 0 10*3/uL (ref 0–0.1)
Eosinophils Absolute: 0.1 10*3/uL (ref 0–0.7)
Eosinophils Relative: 2 %
HEMATOCRIT: 31.1 % — AB (ref 35.0–47.0)
HEMOGLOBIN: 10.2 g/dL — AB (ref 12.0–16.0)
Lymphocytes Relative: 17 %
Lymphs Abs: 1.5 10*3/uL (ref 1.0–3.6)
MCH: 29.3 pg (ref 26.0–34.0)
MCHC: 32.7 g/dL (ref 32.0–36.0)
MCV: 89.4 fL (ref 80.0–100.0)
Monocytes Absolute: 0.6 10*3/uL (ref 0.2–0.9)
Monocytes Relative: 6 %
NEUTROS ABS: 6.6 10*3/uL — AB (ref 1.4–6.5)
Platelets: 201 10*3/uL (ref 150–440)
RBC: 3.48 MIL/uL — AB (ref 3.80–5.20)
RDW: 14.4 % (ref 11.5–14.5)
WBC: 8.8 10*3/uL (ref 3.6–11.0)

## 2015-03-01 LAB — HEPARIN LEVEL (UNFRACTIONATED)
HEPARIN UNFRACTIONATED: 0.23 [IU]/mL — AB (ref 0.30–0.70)
HEPARIN UNFRACTIONATED: 0.47 [IU]/mL (ref 0.30–0.70)
Heparin Unfractionated: 2.74 IU/mL — ABNORMAL HIGH (ref 0.30–0.70)

## 2015-03-01 MED ORDER — PROMETHAZINE HCL 25 MG/ML IJ SOLN
12.5000 mg | Freq: Four times a day (QID) | INTRAMUSCULAR | Status: DC | PRN
  Administered 2015-03-01: 12.5 mg via INTRAVENOUS
  Filled 2015-03-01: qty 1

## 2015-03-01 MED ORDER — HEPARIN (PORCINE) IN NACL 100-0.45 UNIT/ML-% IJ SOLN
1050.0000 [IU]/h | INTRAMUSCULAR | Status: DC
  Administered 2015-03-01 (×2): 1050 [IU]/h via INTRAVENOUS
  Filled 2015-03-01 (×3): qty 250

## 2015-03-01 MED ORDER — PROMETHAZINE HCL 25 MG/ML IJ SOLN
INTRAMUSCULAR | Status: AC
  Administered 2015-03-01: 25 mg
  Filled 2015-03-01: qty 1

## 2015-03-01 NOTE — Anesthesia Postprocedure Evaluation (Signed)
Anesthesia Post Note  Patient: Nancy Hahn  Procedure(s) Performed: Procedure(s) (LRB): EMBOLECTOMY  (Left)  Patient location during evaluation: PACU Anesthesia Type: General Level of consciousness: awake Pain management: pain level controlled Vital Signs Assessment: post-procedure vital signs reviewed and stable Respiratory status: spontaneous breathing Cardiovascular status: blood pressure returned to baseline Anesthetic complications: no    Last Vitals:  Filed Vitals:   02/28/15 1946 03/01/15 0457  BP: 111/37 133/54  Pulse: 71 77  Temp: 36.4 C 36.9 C  Resp: 16 16    Last Pain:  Filed Vitals:   03/01/15 0457  PainSc: 0-No pain                 Porshe Fleagle S

## 2015-03-01 NOTE — Progress Notes (Signed)
S:  Patient states she is sleepy she also notes that the pain and numbness of her left hand is completely gone.  O:  She is afebrile with stable vital signs        She has a 2+ radial pulse and a pink and warm hand with brisk capillary refill on the left.  A:   Status post open embolectomy left arm  P:   Patient is on heparin at this time no further intervention or surgery she will be transitioned to oral agents from a vascular standpoint she is okay for discharge and will follow up with me in the office in 2-3 weeks.

## 2015-03-01 NOTE — Progress Notes (Signed)
Physical Therapy Evaluation Patient Details Name: Nancy Hahn MRN: [id number] DOB: 07-Jul-1931 Today's Date: 03/01/2015   History of Present Illness  This is an 82 year old Caucasian female admitted for arterial thrombus of the left upper extremity.  s/p embolectomy on 02/28/15  Clinical Impression  Pt sleeping prior to PT and c/o nausea and vomiting throughout day.  Pt agreeable to getting up out of bed and was able to walk to chair, but was generally wobbly and unsteady, needing external support.  Suspect pt just not feeling well and also suspect sleepy due to medication.  Anticipate pt will progress quickly once feeling better and will ambulate with increased stamina and confidence.  Pt would benefit from acute PT services to address objective findings.    Follow Up Recommendations Home health PT    Equipment Recommendations  Cane    Recommendations for Other Services       Precautions / Restrictions Precautions Precautions:  (lifting) Restrictions Other Position/Activity Restrictions: no lifting greater than 10 lbs L UE      Mobility  Bed Mobility Overal bed mobility: Modified Independent                Transfers Overall transfer level: Needs assistance Equipment used: 1 person hand held assist Transfers: Sit to/from Stand Sit to Stand: Min guard         General transfer comment: Unsteady upon standing and sitting, impulsive  Ambulation/Gait Ambulation/Gait assistance: Min assist Ambulation Distance (Feet): 12 Feet Assistive device: 1 person hand held assist Gait Pattern/deviations: Step-through pattern;Shuffle     General Gait Details: General unsteadiness, reaching for bed to steady self with some staggering steps; suspect due to anti-nausea medication/sleepiness  Stairs            Wheelchair Mobility    Modified Rankin (Stroke Patients Only)       Balance Overall balance assessment: Needs assistance         Standing balance support:  Single extremity supported Standing balance-Leahy Scale: Fair                               Pertinent Vitals/Pain Pain Assessment: No/denies pain    Home Living Family/patient expects to be discharged to:: Private residence Living Arrangements: Alone Available Help at Discharge: Family (nephew) Type of Home: House Home Access: Stairs to enter Entrance Stairs-Rails: Right Entrance Stairs-Number of Steps: 2 Home Layout: Two level;Able to live on main level with bedroom/bathroom Home Equipment: Gilmer Mor - single point      Prior Function Level of Independence: Independent         Comments: did not use AD prior to admission; I with ADL's and drives.     Hand Dominance        Extremity/Trunk Assessment   Upper Extremity Assessment: Overall WFL for tasks assessed (L UE not tested due to sugrical incision)           Lower Extremity Assessment: Generalized weakness         Communication   Communication: No difficulties  Cognition Arousal/Alertness: Lethargic;Suspect due to medications Behavior During Therapy: Flat affect Overall Cognitive Status: Within Functional Limits for tasks assessed                      General Comments General comments (skin integrity, edema, etc.): incision with some bright red blood, RN applied gauze    Exercises        Assessment/Plan  PT Assessment Patient needs continued PT services  PT Diagnosis Difficulty walking;Generalized weakness   PT Problem List Decreased strength;Decreased activity tolerance;Decreased balance;Decreased mobility;Decreased safety awareness  PT Treatment Interventions Gait training;Stair training;Functional mobility training;Therapeutic activities;Therapeutic exercise;Balance training;Patient/family education   PT Goals (Current goals can be found in the Care Plan section) Acute Rehab PT Goals Patient Stated Goal: To go home PT Goal Formulation: With patient Time For Goal  Achievement: 03/08/15 Potential to Achieve Goals: Good    Frequency Min 2X/week   Barriers to discharge        Co-evaluation               End of Session Equipment Utilized During Treatment: Gait belt Activity Tolerance: Patient limited by fatigue;Patient limited by lethargy Patient left: in chair;with call bell/phone within reach;with chair alarm set;with family/visitor present Nurse Communication: Mobility status (up in chair)         Time: 1440-1501 PT Time Calculation (min) (ACUTE ONLY): 21 min   Charges:   PT Evaluation $PT Eval Low Complexity: 1 Procedure     PT G Codes:        Nancy Hahn A Nancy Hahn 2015/03/13, 3:22 PM

## 2015-03-01 NOTE — Progress Notes (Signed)
ANTICOAGULATION CONSULT NOTE - FOLLOW UP   Pharmacy Consult for heparin Indication: arterial clot  No Known Allergies  Patient Measurements: Height:  (172.7 cm) Weight: 195 lb (88.451 kg) IBW/kg (Calculated) : 63.9 Heparin Dosing Weight: 82.4 kg  Vital Signs: Temp: 97.8 F (36.6 C) (01/19 1205) Temp Source: Oral (01/19 1205) BP: 129/43 mmHg (01/19 1205) Pulse Rate: 64 (01/19 1205)  Labs:  Recent Labs  02/27/15 1957 02/28/15 0820 03/01/15 0103 03/01/15 1052  HGB 13.0  --  10.2*  --   HCT 40.8  --  31.1*  --   PLT 232  --  201  --   APTT 27  --   --   --   LABPROT 14.0  --   --   --   INR 1.06  --   --   --   HEPARINUNFRC  --  0.83* 0.23* 2.74*  CREATININE 1.27*  --  1.23*  --   TROPONINI <0.03  --   --   --     Estimated Creatinine Clearance: 41.7 mL/min (by C-G formula based on Cr of 1.23).  Assessment: 21 yof with left arm pain around 1630, sister called ambulance. Denies CP/nausea/SOB. Arm feels heavy, no obvious neuro deficits. Pt had CT then pharmacy consulted to dose heparin for arterial clot.   Goal of Therapy:  Heparin level 0.3-0.7 units/ml Monitor platelets by anticoagulation protocol: Yes   Plan:  Heparin level subtherapeutic. Restarted approx 4 hrs before level (usually would check lab 8 hours after starting). Will increase slightly to 1350 units/hr and recheck in 8 hours.  1/19: Heparin level resulted as being supratherapeutic. Instructed RN to hold heparin for 1 hour per protocol. Will restart heparin gtt in ~ 1 hour @  A rate of 1050 units/hr (~4 units/kg/hr) decrease. Next heparin level ordered for 2200.   Abhay Godbolt D, Pharm.D., BCPS Clinical Pharmacist 03/01/2015,2:38 PM

## 2015-03-01 NOTE — Progress Notes (Signed)
[facility] [place] [facility]                                                                                                                                                                                            Patient Demographics   Nancy Hahn, is a 82 y.o. female, DOB - 1931-06-17, ZOX:[id number]  Admit date - 02/27/2015   Admitting Physician Arnaldo Natal, MD  Outpatient Primary MD for the patient is BABAOFF, Lavada Mesi, MD   LOS - 1  Subjective:  Patient had a embolectomy yesterday. He complains of nausea and feeling sick. Denies any chest pain or shortness of breath    Review of Systems:   CONSTITUTIONAL: No documented fever. No fatigue, weakness. No weight gain, no weight loss.  EYES: No blurry or double vision.  ENT: No tinnitus. No postnasal drip. No redness of the oropharynx.  RESPIRATORY: No cough, no wheeze, no hemoptysis. No dyspnea.  CARDIOVASCULAR: No chest pain. No orthopnea. No palpitations. No syncope.  GASTROINTESTINAL: Positive nausea, no vomiting or diarrhea. No abdominal pain. No melena or hematochezia.  GENITOURINARY: No dysuria or hematuria.  ENDOCRINE: No polyuria or nocturia. No heat or cold intolerance.  HEMATOLOGY: No anemia. No bruising. No bleeding.  INTEGUMENTARY: No rashes. No lesions.  MUSCULOSKELETAL: No arthritis. No swelling. No gout.  NEUROLOGIC: Left hand numbness, tingling, or ataxia. No seizure-type activity.  PSYCHIATRIC: No anxiety. No insomnia. No ADD.    Vitals:   Filed Vitals:   03/01/15 0457 03/01/15 1125 03/01/15 1131 03/01/15 1205  BP: 133/54   129/43  Pulse: 77   64  Temp: 98.4 F (36.9 C)   97.8 F (36.6 C)  TempSrc: Oral   Oral  Resp: 16   20  Height:      Weight:      SpO2: 99% 92% 95% 95%    Wt Readings from Last 3 Encounters:  02/28/15 88.451 kg (195 lb)  05/27/12 91.173 kg (201 lb)     Intake/Output Summary (Last 24 hours) at 03/01/15 1311 Last data filed at 03/01/15  0735  Gross per 24 hour  Intake 824.41 ml  Output     25 ml  Net 799.41 ml    Physical Exam:   GENERAL: Pleasant-appearing in no apparent distress.  HEAD, EYES, EARS, NOSE AND THROAT: Atraumatic, normocephalic. Extraocular muscles are intact. Pupils equal and reactive to light. Sclerae anicteric. No conjunctival injection. No oro-pharyngeal erythema.  NECK: Supple. There is no jugular venous distention. No bruits, no lymphadenopathy, no thyromegaly.  HEART: Regular rate and rhythm,. No murmurs, no rubs, no clicks.  LUNGS:  Clear to auscultation bilaterally. No rales or rhonchi. No wheezes.  ABDOMEN: Soft, flat, nontender, nondistended. Has good bowel sounds. No hepatosplenomegaly appreciated.  EXTREMITIES:  Bilateral upper extremity with good color NEUROLOGIC: The patient is alert, awake, and oriented x3 with no focal motor or sensory deficits appreciated bilaterally.  SKIN: Moist and warm with no rashes appreciated.  Psych: Not anxious, depressed LN: No inguinal LN enlargement    Antibiotics   Anti-infectives    None      Medications   Scheduled Meds: . aspirin EC  81 mg Oral Daily  . atenolol  50 mg Oral Daily  . docusate sodium  100 mg Oral BID  . lisinopril  40 mg Oral Daily  . NIFEdipine  90 mg Oral Daily  . simvastatin  20 mg Oral Daily  . sodium chloride  3 mL Intravenous Q12H   Continuous Infusions: . sodium chloride 50 mL/hr at 03/01/15 0951  . heparin 1,350 Units/hr (03/01/15 0233)   PRN Meds:.acetaminophen **OR** acetaminophen, morphine injection, ondansetron **OR** ondansetron (ZOFRAN) IV, oxyCODONE-acetaminophen, promethazine   Data Review:   Micro Results No results found for this or any previous visit (from the past 240 hour(s)).  Radiology Reports Dg Chest 2 View  02/27/2015  CLINICAL DATA:  Acute onset left arm pain at 4:30 p.m. today. Initial encounter. EXAM: CHEST  2 VIEW COMPARISON:  None. FINDINGS: Calcified granuloma on the right is noted.  The lungs are otherwise clear. Heart size is normal. No pneumothorax or pleural effusion. Thoracic spondylosis is identified. IMPRESSION: No acute abnormality. Electronically Signed   By: Drusilla Kanner M.D.   On: 02/27/2015 20:18   Ct Angio Up Extrem Left W/cm &/or Wo/cm  02/27/2015  CLINICAL DATA:  82 year old female with left arm pain and heaviness. EXAM: CT ANGIOGRAPHY UPPER LEFT EXTREMITY TECHNIQUE: Transaxial CT of the left upper extremity from the level of the clavicle technique timed was performed in 3 mm slice thickness. Coronal and sagittal reformatted images were provided. The study was performed following intravenous administration of 75 cc Omnipaque 350 CONTRAST:  75mL OMNIPAQUE IOHEXOL 350 MG/ML SOLN COMPARISON:  Go technique FINDINGS: Evaluation of the distal extremity vasculature is limited due to streak artifact caused by patient's body. The aortic arch and the origins of the great vessels appear unremarkable. There is segmental wall thickening with luminal narrowing of the left subclavian artery extending into the left axillary artery and proximal brachial artery. There is high-grade narrowing of the subclavian and axillary segment of the vessel. The distal brachial artery is patent. There is bifurcation of the brachial artery approximately 4.5 cm distal to the elbow. The proximal portion of the radial and ulnar artery appear patent. Evaluation of the distal portion of this vessel is very limited due to suboptimal opacification. There is however apparent diminished flow within the distal radial and ulna are artery concerning for underlying vascular disease. Correlation with clinical exam and duplex ultrasound recommended. The soft tissues of the left upper extremity appear unremarkable. There is no left axillary adenopathy. Linear left lung base atelectatic changes/scarring noted. A 13 mm indeterminate left adrenal nodule. Multiple left renal hypodense lesions, incompletely characterized,  possibly parenchymal and parapelvic cysts. Ultrasound may provide better evaluation. There is diverticulosis of the visualized colon. No acute fracture. Review of the MIP images confirms the above findings. IMPRESSION: Segmental narrowing of the left subclavian, axillary, and proximal brachial arteries. Limited evaluation of the mid and distal portion of the radial and ulna arteries due to suboptimal opacification  concerning for diminished flow related to underlying peripheral vascular disease. Correlation with clinical exam and further evaluation with duplex ultrasound recommended. Electronically Signed   By: Elgie Collard M.D.   On: 02/27/2015 23:55     CBC  Recent Labs Lab 02/27/15 1957 03/01/15 0103  WBC 13.8* 8.8  HGB 13.0 10.2*  HCT 40.8 31.1*  PLT 232 201  MCV 92.6 89.4  MCH 29.4 29.3  MCHC 31.8* 32.7  RDW 14.2 14.4  LYMPHSABS  --  1.5  MONOABS  --  0.6  EOSABS  --  0.1  BASOSABS  --  0.0    Chemistries   Recent Labs Lab 02/27/15 1957 03/01/15 0103  NA 144 139  K 4.7 3.8  CL 103 111  CO2 26 24  GLUCOSE 116* 144*  BUN 28* 20  CREATININE 1.27* 1.23*  CALCIUM 10.4* 8.1*   ------------------------------------------------------------------------------------------------------------------ estimated creatinine clearance is 41.7 mL/min (by C-G formula based on Cr of 1.23). ------------------------------------------------------------------------------------------------------------------  Recent Labs  02/27/15 1957  HGBA1C 6.6*   ------------------------------------------------------------------------------------------------------------------ No results for input(s): CHOL, HDL, LDLCALC, TRIG, CHOLHDL, LDLDIRECT in the last 72 hours. ------------------------------------------------------------------------------------------------------------------  Recent Labs  02/27/15 1957  TSH 4.496    ------------------------------------------------------------------------------------------------------------------ No results for input(s): VITAMINB12, FOLATE, FERRITIN, TIBC, IRON, RETICCTPCT in the last 72 hours.  Coagulation profile  Recent Labs Lab 02/27/15 1957  INR 1.06    No results for input(s): DDIMER in the last 72 hours.  Cardiac Enzymes  Recent Labs Lab 02/27/15 1957  TROPONINI <0.03   ------------------------------------------------------------------------------------------------------------------ Invalid input(s): POCBNP    Assessment & Plan   This is an 82 year old Caucasian female admitted for arterial thrombus of the left upper extremity. 1. Arterial thrombus: Open embolectomy of the left upper extremity, continue heparin per vascular will need anticoagulation as per vascular, I will check echocardiogram to evaluate if there is any cardiac source for thrombus 2. Essential hypertension: Continue atenolol, lisinopril and nifedipine.  3. Hyperlipidemia: Continue Zocor 4. DVT prophylaxis: As above 5. GI prophylaxis: None as the patient is not critically ill     Code Status Orders        Start     Ordered   02/28/15 0208  Full code   Continuous     02/28/15 0208    Code Status History    Date Active Date Inactive Code Status Order ID Comments User Context   This patient has a current code status but no historical code status.    Advance Directive Documentation        Most Recent Value   Type of Advance Directive  Healthcare Power of Attorney   Pre-existing out of facility DNR order (yellow form or pink MOST form)     "MOST" Form in Place?             Consults  vascular consult DVT Prophylaxis  heparin   Lab Results  Component Value Date   PLT 201 03/01/2015     Time Spent in minutes  Auburn Bilberry M.D on 03/01/2015 at 1:11 PM  Between 7am to 6pm - Pager - [phone]  After 6pm go to [email or web address] - password [email or web address]  [facility]  [facility]   Office  [phone]

## 2015-03-01 NOTE — Progress Notes (Signed)
ANTICOAGULATION CONSULT NOTE - FOLLOW UP   Pharmacy Consult for heparin Indication: arterial clot  No Known Allergies  Patient Measurements: Height:  (172.7 cm) Weight: 195 lb (88.451 kg) IBW/kg (Calculated) : 63.9 Heparin Dosing Weight: 82.4 kg  Vital Signs: Temp: 97.6 F (36.4 C) (01/18 1946) Temp Source: Oral (01/18 1946) BP: 111/37 mmHg (01/18 1946) Pulse Rate: 71 (01/18 1946)  Labs:  Recent Labs  02/27/15 1957 02/28/15 0820 03/01/15 0103  HGB 13.0  --  10.2*  HCT 40.8  --  31.1*  PLT 232  --  201  APTT 27  --   --   LABPROT 14.0  --   --   INR 1.06  --   --   HEPARINUNFRC  --  0.83* 0.23*  CREATININE 1.27*  --  1.23*  TROPONINI <0.03  --   --     Estimated Creatinine Clearance: 41.7 mL/min (by C-G formula based on Cr of 1.23).  Assessment: 47 yof with left arm pain around 1630, sister called ambulance. Denies CP/nausea/SOB. Arm feels heavy, no obvious neuro deficits. Pt had CT then pharmacy consulted to dose heparin for arterial clot.   Goal of Therapy:  Heparin level 0.3-0.7 units/ml Monitor platelets by anticoagulation protocol: Yes   Plan:  Heparin level subtherapeutic. Restarted approx 4 hrs before level (usually would check lab 8 hours after starting). Will increase slightly to 1350 units/hr and recheck in 8 hours.  Carola Frost, Pharm.D., BCPS Clinical Pharmacist 03/01/2015,2:27 AM

## 2015-03-02 LAB — CBC
HEMATOCRIT: 33.7 % — AB (ref 35.0–47.0)
HEMOGLOBIN: 11 g/dL — AB (ref 12.0–16.0)
MCH: 29.8 pg (ref 26.0–34.0)
MCHC: 32.6 g/dL (ref 32.0–36.0)
MCV: 91.3 fL (ref 80.0–100.0)
Platelets: 184 10*3/uL (ref 150–440)
RBC: 3.69 MIL/uL — AB (ref 3.80–5.20)
RDW: 13.9 % (ref 11.5–14.5)
WBC: 13 10*3/uL — AB (ref 3.6–11.0)

## 2015-03-02 LAB — BASIC METABOLIC PANEL
ANION GAP: 9 (ref 5–15)
BUN: 17 mg/dL (ref 6–20)
CALCIUM: 8.4 mg/dL — AB (ref 8.9–10.3)
CHLORIDE: 104 mmol/L (ref 101–111)
CO2: 22 mmol/L (ref 22–32)
Creatinine, Ser: 0.72 mg/dL (ref 0.44–1.00)
GFR calc non Af Amer: >60 mL/min (ref >60)
Glucose, Bld: 167 mg/dL — ABNORMAL HIGH (ref 65–99)
Potassium: 3.5 mmol/L (ref 3.5–5.1)
Sodium: 135 mmol/L (ref 135–145)

## 2015-03-02 LAB — SURGICAL PATHOLOGY

## 2015-03-02 LAB — HEPARIN LEVEL (UNFRACTIONATED): Heparin Unfractionated: 0.4 IU/mL (ref 0.30–0.70)

## 2015-03-02 MED ORDER — APIXABAN 5 MG PO TABS: 5.0000 mg | ORAL_TABLET | Freq: Two times a day (BID) | ORAL | Status: DC

## 2015-03-02 MED ORDER — APIXABAN 5 MG PO TABS
10.0000 mg | ORAL_TABLET | Freq: Two times a day (BID) | ORAL | Status: DC
  Administered 2015-03-02 – 2015-03-03 (×3): 10 mg via ORAL
  Filled 2015-03-02 (×5): qty 2

## 2015-03-02 NOTE — Care Management Important Message (Signed)
Important Message  Patient Details  Name: Nancy Hahn MRN: [id number] Date of Birth: [birth year]   Medicare Important Message Given:  Yes    Jaxiel Kines A, RN 03/02/2015, 8:05 AM

## 2015-03-02 NOTE — Progress Notes (Signed)
ANTICOAGULATION CONSULT NOTE - FOLLOW UP   Pharmacy Consult for heparin Indication: arterial clot  No Known Allergies  Patient Measurements: Height:  (172.7 cm) Weight: 199 lb 1.6 oz (90.311 kg) IBW/kg (Calculated) : 63.9 Heparin Dosing Weight: 82.4 kg  Vital Signs: Temp: 97.8 F (36.6 C) (01/20 0834) Temp Source: Oral (01/20 0834) BP: 163/61 mmHg (01/20 0834) Pulse Rate: 71 (01/20 0834)  Labs:  Recent Labs  02/27/15 1957  03/01/15 0103 03/01/15 1052 03/01/15 2234 03/02/15 0746  HGB 13.0  --  10.2*  --   --  11.0*  HCT 40.8  --  31.1*  --   --  33.7*  PLT 232  --  201  --   --  184  APTT 27  --   --   --   --   --   LABPROT 14.0  --   --   --   --   --   INR 1.06  --   --   --   --   --   HEPARINUNFRC  --   < > 0.23* 2.74* 0.47 0.40  CREATININE 1.27*  --  1.23*  --   --  0.72  TROPONINI <0.03  --   --   --   --   --   < > = values in this interval not displayed.  Estimated Creatinine Clearance: 64.9 mL/min (by C-G formula based on Cr of 0.72).  Assessment: 82 yof with left arm pain around 1630, sister called ambulance. Denies CP/nausea/SOB. Arm feels heavy, no obvious neuro deficits. Pt had CT then pharmacy consulted to dose heparin for arterial clot.   Goal of Therapy:  Heparin level 0.3-0.7 units/ml Monitor platelets by anticoagulation protocol: Yes   Plan:  Heparin level therapeutic. Continue current rate, will recheck Heparin level and CBC with am labs.   Dajanay Northrup D, Pharm.D., BCPS Clinical Pharmacist 03/02/2015,9:05 AM

## 2015-03-02 NOTE — Discharge Instructions (Signed)
Information on my medicine - ELIQUIS (apixaban)  This medication education was reviewed with me or my healthcare representative as part of my discharge preparation.  The pharmacist that spoke with me during my hospital stay was:  Demetrius Charity, RPH  Why was Eliquis prescribed for you? Eliquis was prescribed to treat blood clots that may have been found in the veins of your legs (deep vein thrombosis) or in your lungs (pulmonary embolism) and to reduce the risk of them occurring again.  What do You need to know about Eliquis ? The starting dose is 10 mg (two 5 mg tablets) taken TWICE daily for the FIRST SEVEN (7) DAYS, then on (enter date)  ***  the dose is reduced to ONE 5 mg tablet taken TWICE daily.  Eliquis may be taken with or without food.   Try to take the dose about the same time in the morning and in the evening. If you have difficulty swallowing the tablet whole please discuss with your pharmacist how to take the medication safely.  Take Eliquis exactly as prescribed and DO NOT stop taking Eliquis without talking to the doctor who prescribed the medication.  Stopping may increase your risk of developing a new blood clot.  Refill your prescription before you run out.  After discharge, you should have regular check-up appointments with your healthcare provider that is prescribing your Eliquis.    What do you do if you miss a dose? If a dose of ELIQUIS is not taken at the scheduled time, take it as soon as possible on the same day and twice-daily administration should be resumed. The dose should not be doubled to make up for a missed dose.  Important Safety Information A possible side effect of Eliquis is bleeding. You should call your healthcare provider right away if you experience any of the following: ? Bleeding from an injury or your nose that does not stop. ? Unusual colored urine (red or dark brown) or unusual colored stools (red or black). ? Unusual bruising for  unknown reasons. ? A serious fall or if you hit your head (even if there is no bleeding).  Some medicines may interact with Eliquis and might increase your risk of bleeding or clotting while on Eliquis. To help avoid this, consult your healthcare provider or pharmacist prior to using any new prescription or non-prescription medications, including herbals, vitamins, non-steroidal anti-inflammatory drugs (NSAIDs) and supplements.  This website has more information on Eliquis (apixaban): http://www.eliquis.com/eliquis/home

## 2015-03-02 NOTE — Care Management Note (Signed)
Case Management Note  Patient Details  Name: Nancy Hahn MRN: [id number] Date of Birth: Feb 02, 1932  Subjective/Objective:   RNCM assessment for discharge planning. Met with patient and her sister,  Cyril Mourning [phone]). Discussed home PT recommendations. They have no agency preference. PT/OT services arranged with [facility]. It is anticipated that patient will discharge tomorrow. Patient has a cane and a walker. She lives alone and her sister helps her as she is able.                 Action/Plan:   Expected Discharge Date:                  Expected Discharge Plan:  [place]  In-House Referral:     Discharge planning Services  CM Consult  Post Acute Care Choice:  Home Health Choice offered to:  Patient, Sibling  DME Arranged:    DME Agency:     HH Arranged:  PT, OT HH Agency:  [place]  Status of Service:  In process, will continue to follow  Medicare Important Message Given:  Yes Date Medicare IM Given:    Medicare IM give by:    Date Additional Medicare IM Given:    Additional Medicare Important Message give by:     If discussed at [place] of Stay Meetings, dates discussed:    Additional Comments:  Jolly Mango, RN 03/02/2015, 1:53 PM

## 2015-03-02 NOTE — Progress Notes (Signed)
[facility] - [place] at [facility]                                                                                                                                                                                            Patient Demographics   Nancy Hahn, is a 82 y.o. female, DOB - [birth year], UJW:[id number]  Admit date - 02/27/2015   Admitting Physician Arnaldo Natal, MD  Outpatient Primary MD for the patient is BABAOFF, Lavada Mesi, MD   LOS - 2  Subjective:  Patient having some bleeding from the surgical site. He is very weak. Almost fell while trying to get to the bathroom. Still on oxygen therapy.   Review of Systems:   CONSTITUTIONAL: No documented fever. No fatigue, positive weakness. No weight gain, no weight loss.  EYES: No blurry or double vision.  ENT: No tinnitus. No postnasal drip. No redness of the oropharynx.  RESPIRATORY: No cough, no wheeze, no hemoptysis. No dyspnea.  CARDIOVASCULAR: No chest pain. No orthopnea. No palpitations. No syncope.  GASTROINTESTINAL: Positive nausea, no vomiting or diarrhea. No abdominal pain. No melena or hematochezia.  GENITOURINARY: No dysuria or hematuria.  ENDOCRINE: No polyuria or nocturia. No heat or cold intolerance.  HEMATOLOGY: No anemia. No bruising. No bleeding.  INTEGUMENTARY: No rashes. No lesions.  MUSCULOSKELETAL: No arthritis. No swelling. No gout.  NEUROLOGIC: Left hand numbness, tingling, or ataxia. No seizure-type activity.  PSYCHIATRIC: No anxiety. No insomnia. No ADD.    Vitals:   Filed Vitals:   03/01/15 2032 03/02/15 0538 03/02/15 0834 03/02/15 1129  BP: 152/70 157/67 163/61 169/66  Pulse: 72 70 71 73  Temp: 98 F (36.7 C) 97.7 F (36.5 C) 97.8 F (36.6 C) 97.8 F (36.6 C)  TempSrc: Oral Oral Oral Oral  Resp: Height:      Weight:  90.311 kg (199 lb 1.6 oz)    SpO2: 96% 95% 93% 96%    Wt Readings from Last 3 Encounters:  03/02/15 90.311 kg (199 lb 1.6 oz)   05/27/12 91.173 kg (201 lb)     Intake/Output Summary (Last 24 hours) at 03/02/15 1544 Last data filed at 03/02/15 1452  Gross per 24 hour  Intake 2101.88 ml  Output    550 ml  Net 1551.88 ml    Physical Exam:   GENERAL: Pleasant-appearing in no apparent distress.  HEAD, EYES, EARS, NOSE AND THROAT: Atraumatic, normocephalic. Extraocular muscles are intact. Pupils equal and reactive to light. Sclerae anicteric. No conjunctival injection. No oro-pharyngeal erythema.  NECK: Supple. There is no jugular venous distention. No bruits, no  lymphadenopathy, no thyromegaly.  HEART: Regular rate and rhythm,. No murmurs, no rubs, no clicks.  LUNGS: Clear to auscultation bilaterally. No rales or rhonchi. No wheezes.  ABDOMEN: Soft, flat, nontender, nondistended. Has good bowel sounds. No hepatosplenomegaly appreciated.  EXTREMITIES:  Bilateral upper extremity with good color NEUROLOGIC: The patient is alert, awake, and oriented x3 with no focal motor or sensory deficits appreciated bilaterally.  SKIN: Moist and warm with no rashes appreciated.  Psych: Not anxious, depressed LN: No inguinal LN enlargement    Antibiotics   Anti-infectives    None      Medications   Scheduled Meds: . apixaban  10 mg Oral BID  . [START ON 03/09/2015] apixaban  5 mg Oral BID  . aspirin EC  81 mg Oral Daily  . atenolol  50 mg Oral Daily  . docusate sodium  100 mg Oral BID  . lisinopril  40 mg Oral Daily  . NIFEdipine  90 mg Oral Daily  . simvastatin  20 mg Oral Daily  . sodium chloride  3 mL Intravenous Q12H   Continuous Infusions:   PRN Meds:.acetaminophen **OR** acetaminophen, morphine injection, ondansetron **OR** ondansetron (ZOFRAN) IV, oxyCODONE-acetaminophen, promethazine   Data Review:   Micro Results No results found for this or any previous visit (from the past 240 hour(s)).  Radiology Reports Dg Chest 2 View  02/27/2015  CLINICAL DATA:  Acute onset left arm pain at 4:30 p.m.  today. Initial encounter. EXAM: CHEST  2 VIEW COMPARISON:  None. FINDINGS: Calcified granuloma on the right is noted. The lungs are otherwise clear. Heart size is normal. No pneumothorax or pleural effusion. Thoracic spondylosis is identified. IMPRESSION: No acute abnormality. Electronically Signed   By: Drusilla Kanner M.D.   On: 02/27/2015 20:18   Ct Angio Up Extrem Left W/cm &/or Wo/cm  02/27/2015  CLINICAL DATA:  82 year old female with left arm pain and heaviness. EXAM: CT ANGIOGRAPHY UPPER LEFT EXTREMITY TECHNIQUE: Transaxial CT of the left upper extremity from the level of the clavicle technique timed was performed in 3 mm slice thickness. Coronal and sagittal reformatted images were provided. The study was performed following intravenous administration of 75 cc Omnipaque 350 CONTRAST:  75mL OMNIPAQUE IOHEXOL 350 MG/ML SOLN COMPARISON:  Go technique FINDINGS: Evaluation of the distal extremity vasculature is limited due to streak artifact caused by patient's body. The aortic arch and the origins of the great vessels appear unremarkable. There is segmental wall thickening with luminal narrowing of the left subclavian artery extending into the left axillary artery and proximal brachial artery. There is high-grade narrowing of the subclavian and axillary segment of the vessel. The distal brachial artery is patent. There is bifurcation of the brachial artery approximately 4.5 cm distal to the elbow. The proximal portion of the radial and ulnar artery appear patent. Evaluation of the distal portion of this vessel is very limited due to suboptimal opacification. There is however apparent diminished flow within the distal radial and ulna are artery concerning for underlying vascular disease. Correlation with clinical exam and duplex ultrasound recommended. The soft tissues of the left upper extremity appear unremarkable. There is no left axillary adenopathy. Linear left lung base atelectatic changes/scarring  noted. A 13 mm indeterminate left adrenal nodule. Multiple left renal hypodense lesions, incompletely characterized, possibly parenchymal and parapelvic cysts. Ultrasound may provide better evaluation. There is diverticulosis of the visualized colon. No acute fracture. Review of the MIP images confirms the above findings. IMPRESSION: Segmental narrowing of the left subclavian, axillary, and  proximal brachial arteries. Limited evaluation of the mid and distal portion of the radial and ulna arteries due to suboptimal opacification concerning for diminished flow related to underlying peripheral vascular disease. Correlation with clinical exam and further evaluation with duplex ultrasound recommended. Electronically Signed   By: Elgie Collard M.D.   On: 02/27/2015 23:55     CBC  Recent Labs Lab 02/27/15 1957 03/01/15 0103 03/02/15 0746  WBC 13.8* 8.8 13.0*  HGB 13.0 10.2* 11.0*  HCT 40.8 31.1* 33.7*  PLT 232 201 184  MCV 92.6 89.4 91.3  MCH 29.4 29.3 29.8  MCHC 31.8* 32.7 32.6  RDW 14.2 14.4 13.9  LYMPHSABS  --  1.5  --   MONOABS  --  0.6  --   EOSABS  --  0.1  --   BASOSABS  --  0.0  --     Chemistries   Recent Labs Lab 02/27/15 1957 03/01/15 0103 03/02/15 0746  NA 144 139 135  K 4.7 3.8 3.5  CL 103 111 104  CO2 GLUCOSE 116* 144* 167*  BUN 28* 20 17  CREATININE 1.27* 1.23* 0.72  CALCIUM 10.4* 8.1* 8.4*   ------------------------------------------------------------------------------------------------------------------ estimated creatinine clearance is 64.9 mL/min (by C-G formula based on Cr of 0.72). ------------------------------------------------------------------------------------------------------------------  Recent Labs  02/27/15 1957  HGBA1C 6.6*   ------------------------------------------------------------------------------------------------------------------ No results for input(s): CHOL, HDL, LDLCALC, TRIG, CHOLHDL, LDLDIRECT in the last 72  hours. ------------------------------------------------------------------------------------------------------------------  Recent Labs  02/27/15 1957  TSH 4.496   ------------------------------------------------------------------------------------------------------------------ No results for input(s): VITAMINB12, FOLATE, FERRITIN, TIBC, IRON, RETICCTPCT in the last 72 hours.  Coagulation profile  Recent Labs Lab 02/27/15 1957  INR 1.06    No results for input(s): DDIMER in the last 72 hours.  Cardiac Enzymes  Recent Labs Lab 02/27/15 1957  TROPONINI <0.03   ------------------------------------------------------------------------------------------------------------------ Invalid input(s): POCBNP    Assessment & Plan   This is an 82 year old Caucasian female admitted for arterial thrombus of the left upper extremity. 1. Arterial thrombus: Status post Open embolectomy of the left upper extremity,  I discussed the case with Dr. Edrick Kins who recommends anticoagulation I will stop her heparin start her on Eliquis.  due to patient having some bleeding from the surgical sites monitor hemoglobin 2. Essential hypertension: Continue atenolol, lisinopril and nifedipine.  3. Hyperlipidemia: Continue Zocor 4. DVT prophylaxis: As above        Code Status Orders        Start     Ordered   02/28/15 0208  Full code   Continuous     02/28/15 0208    Code Status History    Date Active Date Inactive Code Status Order ID Comments User Context   This patient has a current code status but no historical code status.    Advance Directive Documentation        Most Recent Value   Type of Advance Directive  Healthcare Power of Attorney   Pre-existing out of facility DNR order (yellow form or pink MOST form)     "MOST" Form in Place?             Consults  vascular consult DVT Prophylaxis  heparin   Lab Results  Component Value Date   PLT 184 03/02/2015     Time  Spent in minutes  Auburn Bilberry M.D on 03/02/2015 at 3:44 PM  Between 7am to 6pm - Pager - [phone]  After 6pm go to [email or web address] - password [email or web address] [facility]  [facility]  [facility] Hospitalists   Office  [phone]

## 2015-03-02 NOTE — Progress Notes (Signed)
2 Bouts of with and vomiting of bile green emesis this shift relieved with prn meds.

## 2015-03-02 NOTE — Clinical Documentation Improvement (Signed)
Internal Medicine Vascular Surgery  Abnormal Lab/Test Results:  Component     Latest Ref Rng 02/26/2014 02/27/2015 03/01/2015 03/02/2015  BUN     6 - 20 mg/dL 22 (H) 28 (H) 20 17  Creatinine     0.44 - 1.00 mg/dL 1.19 1.27 (H) 1.23 (H) 0.72                                            EGFR (Non-African Amer.)     >60 mL/min 46 (L) 38 (L) 40 (L) >60    Possible Clinical Conditions associated with below indicators  Acute renal failure  Acute kidney injury  Other Condition  Cannot Clinically Determine  Please document response in progress notes and discharge summary.   Supporting Information:  Family history of CKD  17/2017 CLINICAL DATA: 82 year old female with left arm pain and heaviness. EXAM: CT ANGIOGRAPHY UPPER LEFT EXTREMITY TECHNIQUE: Multiple left renal hypodense lesions, incompletely characterized, possibly parenchymal and parapelvic cysts.   Treatment Provided: Monitoring BMP Daily weights Monitor I&O   Please exercise your independent, professional judgment when responding. A specific answer is not anticipated or expected.   Thank You,  [place] [phone]

## 2015-03-02 NOTE — Progress Notes (Signed)
ANTICOAGULATION CONSULT NOTE - FOLLOW UP   Pharmacy Consult for heparin Indication: arterial clot  No Known Allergies  Patient Measurements: Height:  (172.7 cm) Weight: 195 lb (88.451 kg) IBW/kg (Calculated) : 63.9 Heparin Dosing Weight: 82.4 kg  Vital Signs: Temp: 98 F (36.7 C) (01/19 2032) Temp Source: Oral (01/19 2032) BP: 152/70 mmHg (01/19 2032) Pulse Rate: 72 (01/19 2032)  Labs:  Recent Labs  02/27/15 1957  03/01/15 0103 03/01/15 1052 03/01/15 2234  HGB 13.0  --  10.2*  --   --   HCT 40.8  --  31.1*  --   --   PLT 232  --  201  --   --   APTT 27  --   --   --   --   LABPROT 14.0  --   --   --   --   INR 1.06  --   --   --   --   HEPARINUNFRC  --   < > 0.23* 2.74* 0.47  CREATININE 1.27*  --  1.23*  --   --   TROPONINI <0.03  --   --   --   --   < > = values in this interval not displayed.  Estimated Creatinine Clearance: 41.7 mL/min (by C-G formula based on Cr of 1.23).  Assessment: 43 yof with left arm pain around 1630, sister called ambulance. Denies CP/nausea/SOB. Arm feels heavy, no obvious neuro deficits. Pt had CT then pharmacy consulted to dose heparin for arterial clot.   Goal of Therapy:  Heparin level 0.3-0.7 units/ml Monitor platelets by anticoagulation protocol: Yes   Plan:  Heparin level therapeutic. Continue current rate, will recheck in 8 hours.  Carola Frost, Pharm.D., BCPS Clinical Pharmacist 03/02/2015,12:01 AM

## 2015-03-02 NOTE — Progress Notes (Signed)
ANTICOAGULATION CONSULT NOTE - Initial Consult  Pharmacy Consult for Apixaban Indication: arterial clot  No Known Allergies  Patient Measurements: Height:  (172.7 cm) Weight: 199 lb 1.6 oz (90.311 kg) IBW/kg (Calculated) : 63.9 Heparin Dosing Weight:   Vital Signs: Temp: 97.8 F (36.6 C) (01/20 0834) Temp Source: Oral (01/20 0834) BP: 163/61 mmHg (01/20 0834) Pulse Rate: 71 (01/20 0834)  Labs:  Recent Labs  02/27/15 1957  03/01/15 0103 03/01/15 1052 03/01/15 2234 03/02/15 0746  HGB 13.0  --  10.2*  --   --  11.0*  HCT 40.8  --  31.1*  --   --  33.7*  PLT 232  --  201  --   --  184  APTT 27  --   --   --   --   --   LABPROT 14.0  --   --   --   --   --   INR 1.06  --   --   --   --   --   HEPARINUNFRC  --   < > 0.23* 2.74* 0.47 0.40  CREATININE 1.27*  --  1.23*  --   --  0.72  TROPONINI <0.03  --   --   --   --   --   < > = values in this interval not displayed.  Estimated Creatinine Clearance: 64.9 mL/min (by C-G formula based on Cr of 0.72).   Medical History: Past Medical History  Diagnosis Date  . Breast mass   . Hypertension     Medications:  Prescriptions prior to admission  Medication Sig Dispense Refill Last Dose  . aspirin 81 MG tablet Take 81 mg by mouth daily.   02/27/2015 at Unknown time  . atenolol (TENORMIN) 50 MG tablet Take 50 mg by mouth daily.     Marland Kitchen lisinopril (PRINIVIL,ZESTRIL) 40 MG tablet Take 40 mg by mouth daily.     Marland Kitchen NIFEdipine (PROCARDIA XL/ADALAT-CC) 90 MG 24 hr tablet Take 90 mg by mouth daily.   Taking  . simvastatin (ZOCOR) 20 MG tablet Take 20 mg by mouth daily.   Taking   Scheduled:  . apixaban  10 mg Oral BID  . [START ON 03/09/2015] apixaban  5 mg Oral BID  . aspirin EC  81 mg Oral Daily  . atenolol  50 mg Oral Daily  . docusate sodium  100 mg Oral BID  . lisinopril  40 mg Oral Daily  . NIFEdipine  90 mg Oral Daily  . simvastatin  20 mg Oral Daily  . sodium chloride  3 mL Intravenous Q12H     Assessment: Pharmacy consulted to dose and monitor Apixaban therapy in this arterial clot. Patient was previously on heparin therapy. Goal of Therapy:   Monitor platelets by anticoagulation protocol: Yes   Plan:  Will start apixaban 10 mg PO BID x 7 days then apixaban 5 mg PO BID thereafter.   Pharmacy to follow and monitor therapy.    Alvie Speltz D 03/02/2015,11:00 AM

## 2015-03-03 ENCOUNTER — Encounter (HOSPITAL_COMMUNITY): Payer: Self-pay | Admitting: Neurological Surgery

## 2015-03-03 ENCOUNTER — Inpatient Hospital Stay (HOSPITAL_COMMUNITY)
Admission: AD | Admit: 2015-03-03 | Discharge: 2015-03-13 | DRG: 064 | Disposition: A | Discharge status: Skilled Nursing Facility | Payer: Commercial Managed Care - HMO | Source: Other Acute Inpatient Hospital | Attending: Internal Medicine | Admitting: Internal Medicine

## 2015-03-03 ENCOUNTER — Inpatient Hospital Stay: Payer: Commercial Managed Care - HMO

## 2015-03-03 ENCOUNTER — Encounter: Payer: Self-pay | Admitting: Internal Medicine

## 2015-03-03 ENCOUNTER — Inpatient Hospital Stay (HOSPITAL_COMMUNITY): Payer: Commercial Managed Care - HMO

## 2015-03-03 DIAGNOSIS — I69192 Facial weakness following nontraumatic intracerebral hemorrhage: Secondary | ICD-10-CM | POA: Diagnosis not present

## 2015-03-03 DIAGNOSIS — J9601 Acute respiratory failure with hypoxia: Secondary | ICD-10-CM

## 2015-03-03 DIAGNOSIS — K922 Gastrointestinal hemorrhage, unspecified: Secondary | ICD-10-CM | POA: Diagnosis not present

## 2015-03-03 DIAGNOSIS — Y95 Nosocomial condition: Secondary | ICD-10-CM | POA: Diagnosis not present

## 2015-03-03 DIAGNOSIS — D500 Iron deficiency anemia secondary to blood loss (chronic): Secondary | ICD-10-CM | POA: Diagnosis not present

## 2015-03-03 DIAGNOSIS — Z7982 Long term (current) use of aspirin: Secondary | ICD-10-CM | POA: Diagnosis not present

## 2015-03-03 DIAGNOSIS — I69198 Other sequelae of nontraumatic intracerebral hemorrhage: Secondary | ICD-10-CM | POA: Diagnosis not present

## 2015-03-03 DIAGNOSIS — I748 Embolism and thrombosis of other arteries: Secondary | ICD-10-CM | POA: Insufficient documentation

## 2015-03-03 DIAGNOSIS — E8809 Other disorders of plasma-protein metabolism, not elsewhere classified: Secondary | ICD-10-CM | POA: Diagnosis present

## 2015-03-03 DIAGNOSIS — K259 Gastric ulcer, unspecified as acute or chronic, without hemorrhage or perforation: Secondary | ICD-10-CM | POA: Diagnosis not present

## 2015-03-03 DIAGNOSIS — J811 Chronic pulmonary edema: Secondary | ICD-10-CM | POA: Diagnosis not present

## 2015-03-03 DIAGNOSIS — I639 Cerebral infarction, unspecified: Principal | ICD-10-CM | POA: Insufficient documentation

## 2015-03-03 DIAGNOSIS — J189 Pneumonia, unspecified organism: Secondary | ICD-10-CM | POA: Diagnosis not present

## 2015-03-03 DIAGNOSIS — M6281 Muscle weakness (generalized): Secondary | ICD-10-CM | POA: Diagnosis not present

## 2015-03-03 DIAGNOSIS — E785 Hyperlipidemia, unspecified: Secondary | ICD-10-CM | POA: Insufficient documentation

## 2015-03-03 DIAGNOSIS — E669 Obesity, unspecified: Secondary | ICD-10-CM | POA: Diagnosis not present

## 2015-03-03 DIAGNOSIS — D6489 Other specified anemias: Secondary | ICD-10-CM | POA: Diagnosis present

## 2015-03-03 DIAGNOSIS — K254 Chronic or unspecified gastric ulcer with hemorrhage: Secondary | ICD-10-CM | POA: Diagnosis not present

## 2015-03-03 DIAGNOSIS — Z452 Encounter for adjustment and management of vascular access device: Secondary | ICD-10-CM | POA: Diagnosis not present

## 2015-03-03 DIAGNOSIS — K920 Hematemesis: Secondary | ICD-10-CM | POA: Diagnosis not present

## 2015-03-03 DIAGNOSIS — R29810 Facial weakness: Secondary | ICD-10-CM | POA: Diagnosis not present

## 2015-03-03 DIAGNOSIS — R0602 Shortness of breath: Secondary | ICD-10-CM

## 2015-03-03 DIAGNOSIS — G936 Cerebral edema: Secondary | ICD-10-CM | POA: Diagnosis not present

## 2015-03-03 DIAGNOSIS — Z978 Presence of other specified devices: Secondary | ICD-10-CM | POA: Insufficient documentation

## 2015-03-03 DIAGNOSIS — D509 Iron deficiency anemia, unspecified: Secondary | ICD-10-CM | POA: Diagnosis not present

## 2015-03-03 DIAGNOSIS — I619 Nontraumatic intracerebral hemorrhage, unspecified: Secondary | ICD-10-CM | POA: Diagnosis not present

## 2015-03-03 DIAGNOSIS — K2960 Other gastritis without bleeding: Secondary | ICD-10-CM | POA: Diagnosis present

## 2015-03-03 DIAGNOSIS — Z79899 Other long term (current) drug therapy: Secondary | ICD-10-CM | POA: Diagnosis not present

## 2015-03-03 DIAGNOSIS — I10 Essential (primary) hypertension: Secondary | ICD-10-CM | POA: Insufficient documentation

## 2015-03-03 DIAGNOSIS — I517 Cardiomegaly: Secondary | ICD-10-CM | POA: Diagnosis not present

## 2015-03-03 DIAGNOSIS — I471 Supraventricular tachycardia: Secondary | ICD-10-CM | POA: Diagnosis not present

## 2015-03-03 DIAGNOSIS — R58 Hemorrhage, not elsewhere classified: Secondary | ICD-10-CM

## 2015-03-03 DIAGNOSIS — I69128 Other speech and language deficits following nontraumatic intracerebral hemorrhage: Secondary | ICD-10-CM | POA: Diagnosis not present

## 2015-03-03 DIAGNOSIS — D638 Anemia in other chronic diseases classified elsewhere: Secondary | ICD-10-CM | POA: Diagnosis present

## 2015-03-03 DIAGNOSIS — E1165 Type 2 diabetes mellitus with hyperglycemia: Secondary | ICD-10-CM | POA: Diagnosis not present

## 2015-03-03 DIAGNOSIS — R4781 Slurred speech: Secondary | ICD-10-CM | POA: Diagnosis present

## 2015-03-03 DIAGNOSIS — M79603 Pain in arm, unspecified: Secondary | ICD-10-CM | POA: Diagnosis not present

## 2015-03-03 DIAGNOSIS — I742 Embolism and thrombosis of arteries of the upper extremities: Secondary | ICD-10-CM | POA: Diagnosis not present

## 2015-03-03 DIAGNOSIS — I480 Paroxysmal atrial fibrillation: Secondary | ICD-10-CM | POA: Diagnosis not present

## 2015-03-03 DIAGNOSIS — E876 Hypokalemia: Secondary | ICD-10-CM | POA: Diagnosis not present

## 2015-03-03 DIAGNOSIS — I63542 Cerebral infarction due to unspecified occlusion or stenosis of left cerebellar artery: Secondary | ICD-10-CM | POA: Diagnosis not present

## 2015-03-03 DIAGNOSIS — K921 Melena: Secondary | ICD-10-CM | POA: Diagnosis not present

## 2015-03-03 DIAGNOSIS — I5033 Acute on chronic diastolic (congestive) heart failure: Secondary | ICD-10-CM | POA: Diagnosis not present

## 2015-03-03 DIAGNOSIS — R609 Edema, unspecified: Secondary | ICD-10-CM | POA: Diagnosis not present

## 2015-03-03 DIAGNOSIS — D62 Acute posthemorrhagic anemia: Secondary | ICD-10-CM | POA: Diagnosis not present

## 2015-03-03 DIAGNOSIS — R4182 Altered mental status, unspecified: Secondary | ICD-10-CM | POA: Diagnosis not present

## 2015-03-03 DIAGNOSIS — Z6830 Body mass index (BMI) 30.0-30.9, adult: Secondary | ICD-10-CM | POA: Diagnosis not present

## 2015-03-03 DIAGNOSIS — R112 Nausea with vomiting, unspecified: Secondary | ICD-10-CM | POA: Diagnosis not present

## 2015-03-03 DIAGNOSIS — R05 Cough: Secondary | ICD-10-CM | POA: Diagnosis not present

## 2015-03-03 DIAGNOSIS — I63112 Cerebral infarction due to embolism of left vertebral artery: Secondary | ICD-10-CM | POA: Diagnosis not present

## 2015-03-03 DIAGNOSIS — I618 Other nontraumatic intracerebral hemorrhage: Secondary | ICD-10-CM | POA: Diagnosis not present

## 2015-03-03 DIAGNOSIS — Z9181 History of falling: Secondary | ICD-10-CM | POA: Diagnosis not present

## 2015-03-03 DIAGNOSIS — I110 Hypertensive heart disease with heart failure: Secondary | ICD-10-CM | POA: Diagnosis present

## 2015-03-03 DIAGNOSIS — Z4682 Encounter for fitting and adjustment of non-vascular catheter: Secondary | ICD-10-CM | POA: Diagnosis not present

## 2015-03-03 DIAGNOSIS — J690 Pneumonitis due to inhalation of food and vomit: Secondary | ICD-10-CM | POA: Diagnosis present

## 2015-03-03 DIAGNOSIS — I614 Nontraumatic intracerebral hemorrhage in cerebellum: Secondary | ICD-10-CM | POA: Diagnosis not present

## 2015-03-03 DIAGNOSIS — I6523 Occlusion and stenosis of bilateral carotid arteries: Secondary | ICD-10-CM | POA: Diagnosis not present

## 2015-03-03 DIAGNOSIS — Z789 Other specified health status: Secondary | ICD-10-CM | POA: Diagnosis not present

## 2015-03-03 DIAGNOSIS — K250 Acute gastric ulcer with hemorrhage: Secondary | ICD-10-CM | POA: Diagnosis not present

## 2015-03-03 LAB — COMPREHENSIVE METABOLIC PANEL
ALK PHOS: 53 U/L (ref 38–126)
ALT: 20 U/L (ref 14–54)
ANION GAP: 9 (ref 5–15)
AST: 21 U/L (ref 15–41)
Albumin: 2.5 g/dL — ABNORMAL LOW (ref 3.5–5.0)
BILIRUBIN TOTAL: 0.8 mg/dL (ref 0.3–1.2)
BUN: 20 mg/dL (ref 6–20)
CALCIUM: 8.2 mg/dL — AB (ref 8.9–10.3)
CO2: 23 mmol/L (ref 22–32)
Chloride: 109 mmol/L (ref 101–111)
Creatinine, Ser: 0.85 mg/dL (ref 0.44–1.00)
GLUCOSE: 141 mg/dL — AB (ref 65–99)
POTASSIUM: 3.8 mmol/L (ref 3.5–5.1)
Sodium: 141 mmol/L (ref 135–145)
TOTAL PROTEIN: 5.5 g/dL — AB (ref 6.5–8.1)

## 2015-03-03 LAB — BASIC METABOLIC PANEL
Anion gap: 13 (ref 5–15)
BUN: 20 mg/dL (ref 6–20)
CALCIUM: 8.6 mg/dL — AB (ref 8.9–10.3)
CO2: 25 mmol/L (ref 22–32)
Chloride: 102 mmol/L (ref 101–111)
Creatinine, Ser: 0.8 mg/dL (ref 0.44–1.00)
GFR calc Af Amer: >60 mL/min (ref >60)
GLUCOSE: 174 mg/dL — AB (ref 65–99)
POTASSIUM: 3.4 mmol/L — AB (ref 3.5–5.1)
SODIUM: 140 mmol/L (ref 135–145)

## 2015-03-03 LAB — CBC
HEMATOCRIT: 33.9 % — AB (ref 35.0–47.0)
Hemoglobin: 11.2 g/dL — ABNORMAL LOW (ref 12.0–16.0)
MCH: 30.2 pg (ref 26.0–34.0)
MCHC: 33.1 g/dL (ref 32.0–36.0)
MCV: 91.1 fL (ref 80.0–100.0)
PLATELETS: 191 10*3/uL (ref 150–440)
RBC: 3.72 MIL/uL — ABNORMAL LOW (ref 3.80–5.20)
RDW: 13.9 % (ref 11.5–14.5)
WBC: 15.1 10*3/uL — AB (ref 3.6–11.0)

## 2015-03-03 LAB — APTT: APTT: 27 s (ref 24–37)

## 2015-03-03 LAB — TRIGLYCERIDES
TRIGLYCERIDES: 60 mg/dL (ref <150)
Triglycerides: 129 mg/dL (ref <150)

## 2015-03-03 LAB — MAGNESIUM: MAGNESIUM: 1.9 mg/dL (ref 1.7–2.4)

## 2015-03-03 LAB — PROTIME-INR
INR: 1.66 — AB (ref 0.00–1.49)
Prothrombin Time: 19.6 seconds — ABNORMAL HIGH (ref 11.6–15.2)

## 2015-03-03 LAB — GLUCOSE, CAPILLARY: Glucose-Capillary: 134 mg/dL — ABNORMAL HIGH (ref 65–99)

## 2015-03-03 LAB — PHOSPHORUS: PHOSPHORUS: 2.4 mg/dL — AB (ref 2.5–4.6)

## 2015-03-03 MED ORDER — FENTANYL CITRATE (PF) 100 MCG/2ML IJ SOLN
25.0000 ug | INTRAMUSCULAR | Status: DC | PRN
  Administered 2015-03-03: 50 ug via INTRAVENOUS
  Filled 2015-03-03: qty 2

## 2015-03-03 MED ORDER — INSULIN ASPART 100 UNIT/ML ~~LOC~~ SOLN
0.0000 [IU] | SUBCUTANEOUS | Status: DC
Start: 2015-03-04 — End: 2015-03-06
  Administered 2015-03-04 – 2015-03-05 (×4): 2 [IU] via SUBCUTANEOUS
  Administered 2015-03-05: 3 [IU] via SUBCUTANEOUS
  Administered 2015-03-06: 2 [IU] via SUBCUTANEOUS
  Administered 2015-03-06 (×2): 3 [IU] via SUBCUTANEOUS

## 2015-03-03 MED ORDER — SODIUM CHLORIDE 0.9 % IV SOLN
INTRAVENOUS | Status: DC
  Administered 2015-03-03: 09:00:00 via INTRAVENOUS

## 2015-03-03 MED ORDER — LEVOFLOXACIN IN D5W 750 MG/150ML IV SOLN
750.0000 mg | INTRAVENOUS | Status: DC
  Administered 2015-03-03: 750 mg via INTRAVENOUS
  Filled 2015-03-03 (×2): qty 150

## 2015-03-03 MED ORDER — PROPOFOL 1000 MG/100ML IV EMUL
5.0000 ug/kg/min | INTRAVENOUS | Status: DC
  Administered 2015-03-03: 15 ug/kg/min via INTRAVENOUS

## 2015-03-03 MED ORDER — FAMOTIDINE 20 MG PO TABS
20.0000 mg | ORAL_TABLET | Freq: Two times a day (BID) | ORAL | Status: DC
  Administered 2015-03-04 – 2015-03-07 (×6): 20 mg via ORAL
  Filled 2015-03-03 (×7): qty 1

## 2015-03-03 MED ORDER — GADOBENATE DIMEGLUMINE 529 MG/ML IV SOLN
20.0000 mL | Freq: Once | INTRAVENOUS | Status: AC | PRN
  Administered 2015-03-03: 18 mL via INTRAVENOUS

## 2015-03-03 MED ORDER — VANCOMYCIN HCL IN DEXTROSE 1-5 GM/200ML-% IV SOLN
1000.0000 mg | Freq: Once | INTRAVENOUS | Status: AC
  Administered 2015-03-03: 1000 mg via INTRAVENOUS
  Filled 2015-03-03: qty 200

## 2015-03-03 MED ORDER — SIMVASTATIN 20 MG PO TABS
20.0000 mg | ORAL_TABLET | Freq: Every day | ORAL | Status: DC
  Administered 2015-03-04 – 2015-03-10 (×6): 20 mg via ORAL
  Filled 2015-03-03 (×6): qty 1

## 2015-03-03 MED ORDER — PHENOL 1.4 % MT LIQD: 1.0000 | OROMUCOSAL | Status: DC | PRN

## 2015-03-03 MED ORDER — ANTISEPTIC ORAL RINSE SOLUTION (CORINZ)
7.0000 mL | OROMUCOSAL | Status: DC
  Administered 2015-03-04 – 2015-03-07 (×29): 7 mL via OROMUCOSAL

## 2015-03-03 MED ORDER — SODIUM CHLORIDE 0.9 % IV BOLUS (SEPSIS)
1000.0000 mL | Freq: Once | INTRAVENOUS | Status: AC
Start: 2015-03-03 — End: 2015-03-03
  Administered 2015-03-03: 1000 mL via INTRAVENOUS

## 2015-03-03 MED ORDER — PROTHROMBIN COMPLEX CONC HUMAN 500 UNITS IV KIT
50.0000 [IU]/kg | PACK | Status: AC
  Administered 2015-03-03: 4500 [IU] via INTRAVENOUS
  Filled 2015-03-03: qty 180

## 2015-03-03 MED ORDER — CHARCOAL ACTIVATED PO LIQD
50.0000 g | ORAL | Status: DC
  Filled 2015-03-03: qty 240

## 2015-03-03 MED ORDER — PROPOFOL 1000 MG/100ML IV EMUL
INTRAVENOUS | Status: AC
  Filled 2015-03-03: qty 100

## 2015-03-03 MED ORDER — FENTANYL CITRATE (PF) 100 MCG/2ML IJ SOLN
200.0000 ug | Freq: Once | INTRAMUSCULAR | Status: AC
  Administered 2015-03-03: 200 ug via INTRAVENOUS

## 2015-03-03 MED ORDER — PIPERACILLIN-TAZOBACTAM 3.375 G IVPB
3.3750 g | Freq: Three times a day (TID) | INTRAVENOUS | Status: DC
  Administered 2015-03-03: 3.375 g via INTRAVENOUS
  Filled 2015-03-03 (×3): qty 50

## 2015-03-03 MED ORDER — MIDAZOLAM HCL 2 MG/2ML IJ SOLN
2.0000 mg | Freq: Once | INTRAMUSCULAR | Status: AC
  Administered 2015-03-03: 2 mg via INTRAVENOUS

## 2015-03-03 MED ORDER — MIDAZOLAM HCL 2 MG/2ML IJ SOLN
INTRAMUSCULAR | Status: AC
  Administered 2015-03-03: 2 mg via INTRAVENOUS
  Filled 2015-03-03: qty 2

## 2015-03-03 MED ORDER — FENTANYL CITRATE (PF) 100 MCG/2ML IJ SOLN
INTRAMUSCULAR | Status: AC
  Administered 2015-03-03: 200 ug via INTRAVENOUS
  Filled 2015-03-03: qty 4

## 2015-03-03 MED ORDER — CHLORHEXIDINE GLUCONATE 0.12% ORAL RINSE (MEDLINE KIT)
15.0000 mL | Freq: Two times a day (BID) | OROMUCOSAL | Status: DC
  Administered 2015-03-04 – 2015-03-07 (×8): 15 mL via OROMUCOSAL

## 2015-03-03 MED ORDER — VECURONIUM BROMIDE 10 MG IV SOLR
10.0000 mg | Freq: Once | INTRAVENOUS | Status: AC
  Administered 2015-03-03: 10 mg via INTRAVENOUS

## 2015-03-03 MED ORDER — SODIUM CHLORIDE 0.9 % IV SOLN: 250.0000 mL | INTRAVENOUS | Status: DC | PRN

## 2015-03-03 MED ORDER — IPRATROPIUM-ALBUTEROL 0.5-2.5 (3) MG/3ML IN SOLN
3.0000 mL | RESPIRATORY_TRACT | Status: DC | PRN
  Administered 2015-03-03: 3 mL via RESPIRATORY_TRACT
  Filled 2015-03-03: qty 3

## 2015-03-03 MED ORDER — VANCOMYCIN HCL IN DEXTROSE 1-5 GM/200ML-% IV SOLN
1000.0000 mg | Freq: Two times a day (BID) | INTRAVENOUS | Status: DC
  Filled 2015-03-03 (×2): qty 200

## 2015-03-03 MED ORDER — PROPOFOL 1000 MG/100ML IV EMUL
5.0000 ug/kg/min | INTRAVENOUS | Status: DC
  Administered 2015-03-04 (×2): 15 ug/kg/min via INTRAVENOUS
  Administered 2015-03-04: 14.983 ug/kg/min via INTRAVENOUS
  Filled 2015-03-03 (×3): qty 100

## 2015-03-03 NOTE — Progress Notes (Addendum)
[facility] - [place] at [facility]   PATIENT NAME: Nancy Hahn    MR#:  [id number]  DATE OF BIRTH:  1931/06/15  SUBJECTIVE:   Declined overnite with fever,sore throat, coughing and cxr s/p PNA REVIEW OF SYSTEMS:   Review of Systems  Constitutional: Positive for fever. Negative for chills and weight loss.  HENT: Positive for sore throat. Negative for ear discharge, ear pain and nosebleeds.   Eyes: Negative for blurred vision, pain and discharge.  Respiratory: Positive for cough, sputum production and shortness of breath. Negative for wheezing and stridor.   Cardiovascular: Negative for chest pain, palpitations, orthopnea and PND.  Gastrointestinal: Negative for nausea, vomiting, abdominal pain and diarrhea.  Genitourinary: Negative for urgency and frequency.  Musculoskeletal: Negative for back pain and joint pain.  Neurological: Positive for weakness. Negative for sensory change, speech change and focal weakness.  Psychiatric/Behavioral: Negative for depression and hallucinations. The patient is not nervous/anxious.   All other systems reviewed and are negative.  Tolerating Diet:some Tolerating PT: HHPT recommended  DRUG ALLERGIES:  No Known Allergies  VITALS:  Blood pressure 164/64, pulse 72, temperature 99.1 F (37.3 C), temperature source Oral, resp. rate 20, height  (1.727 m), weight 90.13 kg (198 lb 11.2 oz), SpO2 95 %.  PHYSICAL EXAMINATION:   Physical Exam  GENERAL:  82 y.o.-year-old patient lying in the bed with no acute distress. Looks weak and ill EYES: Pupils equal, round, reactive to light and accommodation. No scleral icterus. Extraocular muscles intact.  HEENT: Head atraumatic, normocephalic. Oropharynx and nasopharynx clear.  NECK:  Supple, no jugular venous distention. No thyroid enlargement, no tenderness.  LUNGS: coarse breath sounds bilaterally, no wheezing, rales, rhonchi. No use of accessory muscles of  respiration.  CARDIOVASCULAR: S1, S2 normal. No murmurs, rubs, or gallops.  ABDOMEN: Soft, nontender, nondistended. Bowel sounds present. No organomegaly or mass.  EXTREMITIES: No cyanosis, clubbing or edema b/l.    NEUROLOGIC: Cranial nerves II through XII are intact. No focal Motor or sensory deficits b/l.  No facial drool. Hand grips good PSYCHIATRIC: The patient is alert and oriented x 3.  SKIN: No obvious rash, lesion, or ulcer.   LABORATORY PANEL:  CBC  Recent Labs Lab 03/03/15 0458  WBC 15.1*  HGB 11.2*  HCT 33.9*  PLT 191    Chemistries   Recent Labs Lab 03/03/15 0458  NA 140  K 3.4*  CL 102  CO2 25  GLUCOSE 174*  BUN 20  CREATININE 0.80  CALCIUM 8.6*    Cardiac Enzymes  Recent Labs Lab 02/27/15 1957  TROPONINI <0.03   RADIOLOGY:  Dg Chest 1 View  03/03/2015  CLINICAL DATA:  Acute onset of hypoxia. Cough and shortness of breath. Insert macro EXAM: CHEST 1 VIEW COMPARISON:  Chest radiograph performed 02/27/2015 FINDINGS: The lungs are well-aerated. Patchy right basilar airspace opacity raises concern for pneumonia. The appearance is less typical for asymmetric pulmonary edema. Underlying vascular congestion is noted. Mild left perihilar opacity is seen. No pleural effusion or pneumothorax is identified. The cardiomediastinal silhouette is mildly enlarged. No acute osseous abnormalities are seen. IMPRESSION: 1. Patchy right basilar airspace opacity raises concern for pneumonia. The appearance is less typical for asymmetric pulmonary edema. Mild left perihilar opacity seen. 2. Underlying vascular congestion and mild cardiomegaly. Electronically Signed   By: Roanna Raider M.D.   On: 03/03/2015 01:06   ASSESSMENT AND PLAN:  82 year old Caucasian female with h/o HTN was admitted for arterial thrombus of the left  upper extremity.  1.Acute left subclavian  Arterial thrombus: s/p thrombectomy -was on IV heparin gtt--->now on po eliquis -pt has good pulse and left  hand grip  2. Fever,cough and hypoxia with CXR s/o right LL pneumonia. Pt has sore throat -appears HCAP pneumonia -received Vanc and zosyn-->IV levaquin -monitor wbc count -incentive spirometer, cepachol spray, speech for swallow eval -cont oxygen for now wean as tolerated  3. Essential hypertension: Continue atenolol, lisinopril and nifedipine.   4. Hyperlipidemia: Continue Zocor  5. DVT prophylaxis: As above   Case discussed with Care Management/Social Worker. Management plans discussed with the patient, family and they are in agreement.  CODE STATUS: full  DVT Prophylaxis: eliquis  TOTAL TIME TAKING CARE OF THIS PATIENT:35 minutes.  >50% time spent on counselling and coordination of care  POSSIBLE D/C IN 1-2DAYS, DEPENDING ON CLINICAL CONDITION.  Note: This dictation was prepared with Dragon dictation along with smaller phrase technology. Any transcriptional errors that result from this process are unintentional.  Tayvia Faughnan M.D on 03/03/2015 at 9:22 AM  Between 7am to 6pm - Pager - [phone]  After 6pm go to [email or web address] - password [email or web address] [facility]  [facility] Hospital[facility]  Office  [phone]  CC: Primary care physician; BABAOFF, Lavada Mesi, MD

## 2015-03-03 NOTE — Progress Notes (Signed)
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 Sister also explained pt may not survive this episode.  Transfer to ICU stat.on call hospitalist called for intubation Dr Sharol Harness called and informed about pt being transferred to CCU. Charge nurse in ICU informed.

## 2015-03-03 NOTE — Discharge Summary (Signed)
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 Breast mass   . Hypertension     HOSPITAL COURSE:  82 year old Caucasian female with h/o HTN was admitted for arterial thrombus of the left upper extremity.  1. Acute Large acute hemorrhagic left mid to inferior cerebellar infarct with associated mass effect compressing the fourth ventricle -pt was on IV heprain pre and post embolectomy and then changed to po eliquis. She developed sore throat and hoarse voice with difficulty breathing and garbled speech. MRI brain was performed and results showed as abve -stat Neurology consult obtained (over the phone ) and recommend to transfer to tertiary care canter -pt is intubated and on the vent -spoke with ICU attending at [facility] and [facility] is being [facility] for neurosurgery evaluation -pt's sister aware of it. -Feiba protocol initiated to reverse eliquis effect.  2..Acute left subclavian Arterial thrombus: s/p thrombectomy -was on IV heparin gtt--->now on po eliquis. D/ced eliquis  3.. Fever,cough and hypoxia with CXR s/o right LL pneumonia possible aspiration -received Vanc and zosyn-->IV levaquin -monitor wbc count -incentive spirometer, cepachol spray, speech for swallow eval -pt is now intubated and on the vent  3. Essential hypertension:  was on atenolol, lisinopril and nifedipine-hold for now give acute hemorrahagic stroke  4. Hyperlipidemia: was on Zocor  5. DVT prophylaxis: scd and teds  Critically ill. Being transferred to [facility] MICU   All the consultants are aware and have been notified. CONSULTS OBTAINED:  Treatment Team:  Renford Dills, MD Thana Farr, MD Merwyn Katos, MD  DRUG ALLERGIES:  No Known Allergies   ICBC   Recent Labs Lab 03/03/15 0458  WBC 15.1*  HGB 11.2*  HCT 33.9*  PLT 191    Chemistries   Recent Labs Lab 03/03/15 0458  NA 140  K 3.4*  CL 102  CO2 25  GLUCOSE 174*  BUN 20  CREATININE 0.80  CALCIUM 8.6*    Microbiology Results   No results found for this or any previous visit (from the past 240 hour(s)).  RADIOLOGY:  Dg Chest 1 View  03/03/2015  CLINICAL DATA:  Acute onset of hypoxia. Cough and shortness of breath. Insert macro EXAM: CHEST 1 VIEW COMPARISON:  Chest radiograph performed 02/27/2015 FINDINGS: The lungs are well-aerated. Patchy right basilar airspace opacity raises concern for pneumonia. The appearance is less typical for asymmetric pulmonary edema. Underlying vascular congestion is noted. Mild left perihilar opacity is seen. No pleural effusion or pneumothorax is identified. The cardiomediastinal silhouette is mildly enlarged. No acute osseous abnormalities are seen. IMPRESSION: 1. Patchy right basilar airspace opacity raises concern for pneumonia. The appearance is less typical for asymmetric pulmonary edema. Mild left perihilar opacity seen. 2. Underlying vascular congestion and mild cardiomegaly. Electronically Signed   By: Roanna Raider M.D.   On: 03/03/2015 01:06  Mr Laqueta Jean Wo Contrast  03/03/2015  CLINICAL DATA:  82 year old hypertensive female with altered mental status and facial weakness. Subsequent encounter. EXAM: MRI HEAD WITHOUT AND WITH CONTRAST TECHNIQUE: Multiplanar, multiecho pulse sequences of the brain and surrounding  structures were obtained without and with intravenous contrast. CONTRAST:  18mL MULTIHANCE GADOBENATE DIMEGLUMINE 529 MG/ML IV SOLN COMPARISON:  02/26/2014. 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 Interval slight increase in size of ventricles compared to recent CT consistent with developing hydrocephalus. Patient is un safe for lumbar puncture. Mild small vessel disease changes. No intracranial mass separate from above described findings. Fast technique imaging had to be utilized. Left vertebral artery is at least partially patent. Right vertebral artery, basilar artery and internal carotid arteries are patent. Limited assessment of the left posterior inferior cerebellar artery. 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 Interval slight increase in size of ventricles compared to recent CT consistent with developing hydrocephalus. Left vertebral artery is at least partially patent. Limited assessment of the left posterior inferior cerebellar artery. These results will be called to the ordering clinician or representative by the Radiologist Assistant, and communication documented in the PACS or zVision Dashboard. Electronically Signed   By: Lacy Duverney M.D.   On: 03/03/2015 16:55     Management plans discussed with the family and they are in agreement.  CODE STATUS:     Code Status Orders        Start     Ordered   02/28/15 0208  Full code   Continuous     02/28/15 0208    Code Status History    Date Active Date Inactive Code Status Order ID Comments User Context   This patient has a current code status but no historical code status.    Advance Directive Documentation        Most Recent Value   Type of Advance Directive  Healthcare Power of Attorney   Pre-existing out of facility DNR order (yellow form  or pink MOST form)     "MOST" Form in Place?        TOTAL TIME TAKING CARE OF THIS PATIENT: 45 minutes.    Deshawn Witty M.D on 03/03/2015 at 6:02 PM  Between 7am to 6pm - Pager - [phone] After 6pm go to [email or web address] - password [email or web address] [facility]  [facility]  Office  [phone]  CC: Primary care physician; BABAOFF, Lavada Mesi, MD

## 2015-03-03 NOTE — Progress Notes (Signed)
Spoke with Dr Hosie Poisson (ICU oncall at [facility])  Pt has been accepted at [facility]. Pt will transport via [facility]. She is intubated and on the ventilator.  EMTALA for amd d/c summary done

## 2015-03-03 NOTE — Progress Notes (Signed)
[facility] transort team left unit with pt. In stable condition.  Spoke with Victorino Dike, RN @ [facility] alerting her report had already been given by Nia, RN from day shift.  Family was given information regarding room number and phone number, family set up "[email or web address]" as password and would visit tomorrow.  Pt. Left with propofol gtt @ 30 and NS 1 L bolus running. Pt. On ventilator w/ NGT clamped, VS WDL.

## 2015-03-03 NOTE — Progress Notes (Signed)
Patient transferred from 96 for intubation prior to transport to [facility] for CVA. Intubated by Greggory Stallion, RT and Frienda, RT, with Dr. Luberta Mutter at bedside. Administered 2 mg IV Versed, 200 mcg IV Fentanyl, and 10 mg Vecuronium per MD order. Right nare NG placed and foley catheter. CXR and abdominal AP for verification of ETT and NG. Currently has 20 mcg Propofol infusing and NS through PIV. Patient sister and family members at bedside. Patient has been accepted and has room, called [facility] and they will contact us prior to transport. Nia, RN, has given report to [facility] RN. Dr. Allena Katz updated on patient status.

## 2015-03-03 NOTE — Progress Notes (Signed)
Patient intubated by this RT in the first attempt. 7.5 mm ET tube placed without difficulty. Position verified by end tidal co2 monitor and auscultation. Tube secured at 23 cm at lip

## 2015-03-03 NOTE — Progress Notes (Signed)
Patient MRI was positive for acute stroke, and was transfer to the unit for close monitoring , report given to icu nurse

## 2015-03-03 NOTE — Progress Notes (Signed)
[facility] - [place] at [facility]   PATIENT NAME: Nancy Hahn    MR#:  [id number]  DATE OF BIRTH:  Jun 26, 1931  SUBJECTIVE: Patient had hemorrhagic stroke, , had to be intubated, went to see the patient before intubation. Patient awake, oriented followed the commands. But had shallow breathing pattern. So we intubated the patient.   CHIEF COMPLAINT:   Chief Complaint  Patient presents with  . Arm Pain  . Numbness    REVIEW OF SYSTEMS:   ROS   DRUG ALLERGIES:  No Known Allergies  VITALS:  Blood pressure 148/62, pulse 71, temperature 98.5 F (36.9 C), temperature source Oral, resp. rate 16, height  (1.727 m), weight 90.13 kg (198 lb 11.2 oz), SpO2 94 %.  PHYSICAL EXAMINATION:  GENERAL:  82 y.o.-year-old patient lying in the bed with no acute distress.  EYES: Pupils equal, round, reactive to light and accommodation. No scleral icterus. Extraocular muscles intact.  HEENT: Head atraumatic, normocephalic. Oropharynx and nasopharynx clear.  NECK:  Supple, no jugular venous distention. No thyroid enlargement, no tenderness.  LUNGS: Normal breath sounds bilaterally, no wheezing, rales,rhonchi or crepitation. No use of accessory muscles of respiration.  CARDIOVASCULAR: S1, S2 normal. No murmurs, rubs, or gallops.  ABDOMEN: Soft, nontender, nondistended. Bowel sounds present. No organomegaly or mass.  EXTREMITIES: No pedal edema, cyanosis, or clubbing.  NEUROLOGIC: Awake, oriented. Power 5 upper and hemorrhagic stroke requiring wiring full vent support     SKIN: No obvious rash, lesion, or ulcer.    LABORATORY PANEL:   CBC  Recent Labs Lab 03/03/15 0458  WBC 15.1*  HGB 11.2*  HCT 33.9*  PLT 191   ------------------------------------------------------------------------------------------------------------------  Chemistries   Recent Labs Lab 03/03/15 0458  NA 140  K 3.4*  CL 102  CO2 25  GLUCOSE 174*  BUN 20  CREATININE 0.80   CALCIUM 8.6*   ------------------------------------------------------------------------------------------------------------------  Cardiac Enzymes  Recent Labs Lab 02/27/15 1957  TROPONINI <0.03   ------------------------------------------------------------------------------------------------------------------  RADIOLOGY:  Dg Chest 1 View  03/03/2015  CLINICAL DATA:  Acute onset of hypoxia. Cough and shortness of breath. Insert macro EXAM: CHEST 1 VIEW COMPARISON:  Chest radiograph performed 02/27/2015 FINDINGS: The lungs are well-aerated. Patchy right basilar airspace opacity raises concern for pneumonia. The appearance is less typical for asymmetric pulmonary edema. Underlying vascular congestion is noted. Mild left perihilar opacity is seen. No pleural effusion or pneumothorax is identified. The cardiomediastinal silhouette is mildly enlarged. No acute osseous abnormalities are seen. IMPRESSION: 1. Patchy right basilar airspace opacity raises concern for pneumonia. The appearance is less typical for asymmetric pulmonary edema. Mild left perihilar opacity seen. 2. Underlying vascular congestion and mild cardiomegaly. Electronically Signed   By: Roanna Raider M.D.   On: 03/03/2015 01:06   Dg Abd 1 View  03/03/2015  CLINICAL DATA:  Status post enteric catheter placement. EXAM: ABDOMEN - 1 VIEW COMPARISON:  None. FINDINGS: Enteric catheter seen with tip overlying gastric body. The bowel gas pattern is normal. No radiographic evidence of organomegaly. No radio-opaque calculi or other significant radiographic abnormality are seen. IMPRESSION: Enteric catheter with tip overlying the gastric body. Electronically Signed   By: Ted Mcalpine M.D.   On: 03/03/2015 19:05   Mr Laqueta Jean WU Contrast  03/03/2015  CLINICAL DATA:  82 year old hypertensive female with altered mental status and facial weakness. Subsequent encounter. EXAM: MRI HEAD WITHOUT AND WITH CONTRAST TECHNIQUE: Multiplanar,  multiecho pulse sequences of the brain and surrounding structures were obtained without and  with intravenous contrast. CONTRAST:  18mL MULTIHANCE GADOBENATE DIMEGLUMINE 529 MG/ML IV SOLN COMPARISON:  02/26/2014. 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 Interval slight increase in size of ventricles compared to recent CT consistent with developing hydrocephalus. Patient is un safe for lumbar puncture. Mild small vessel disease changes. No intracranial mass separate from above described findings. Fast technique imaging had to be utilized. Left vertebral artery is at least partially patent. Right vertebral artery, basilar artery and internal carotid arteries are patent. Limited assessment of the left posterior inferior cerebellar artery. 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 Interval slight increase in size of ventricles compared to recent CT consistent with developing hydrocephalus. Left vertebral artery is at least partially patent. Limited assessment of the left posterior inferior cerebellar artery. These results will be called to the ordering clinician or representative by the Radiologist Assistant, and communication documented in the PACS or zVision Dashboard. Electronically Signed   By: Lacy Duverney M.D.   On: 03/03/2015 16:55   Dg Chest Port 1 View  03/03/2015  CLINICAL DATA:  82 year old female being transferred to [facility] from [facility] , intubated and sedated now. Cough and shortness of breath. Initial encounter. EXAM: PORTABLE CHEST 1 VIEW COMPARISON:  [facility] 0022 hours today. FINDINGS: Portable AP semi upright view at 1839 hours. Endotracheal tube tip in good position between the level the clavicles and carina. Enteric tube courses to the left upper quadrant,  tip not included. Interval worsening confluence of right lung base opacity, favor lower lobe. Upper lobes appear stable. Left lung base is stable and probably unaffected. No superimposed pneumothorax or pulmonary edema. No pleural effusion identified. IMPRESSION: 1. Endotracheal tube tip in good position. Enteric tube courses to the abdomen. 2. Radiographic progression of right lung base pneumonia and/or aspiration since earlier today. No pleural effusion identified. Electronically Signed   By: Odessa Fleming M.D.   On: 03/03/2015 19:04    EKG:   Orders placed or performed during the hospital encounter of 02/27/15  . EKG 12-Lead  . EKG 12-Lead  . ED EKG within 10 minutes  . ED EKG within 10 minutes    ASSESSMENT AND PLAN:   #1 acute large hemorrhagic stroke with mass effect:Intubated for impending respiratory failure,, transfer out to [facility]. Primary M.D. is arranging the transfer to [facility] ICU. #2 acute left subclavian DVT status post thrombectomy, hypertension, and liquids. More than 20 minutes of time spent in reviewing the records, stabilizing the patient. Prognosis;extremly poor.  All the records are reviewed and case discussed with Care Management/Social Workerr. Management plans discussed with the patient, family and they are in agreement.  CODE STATUS: Full  TOTAL TIME TAKING CARE OF THIS PATIENT:20  minutes. (Critical Care)    Tammra Pressman M.D on 03/03/2015 at 7:58 PM  Between 7am to 6pm - Pager - [phone]  After 6pm go to [email or web address] - password [email or web address] [facility]  [place] [facility]  Office  [phone]  CC: Primary care physician; BABAOFF, Lavada Mesi, MD   Note: This dictation was prepared with Dragon dictation along with smaller phrase technology. Any transcriptional errors that result from this process are unintentional.

## 2015-03-03 NOTE — Consult Note (Signed)
Reason for Consult: Left cerebellar hemorrhagic infarct Referring Physician: CCM  Nancy Hahn is an 82 y.o. female.   HPI:  82 year old female who is unable to cooperate with much history and physical as she is intubated and sedated. Apparently she was admitted about 4 days ago with a "blue arm" on the left and underwent an embolectomy of a thrombus within the left subclavian artery.She was initially heparinized and then converted to eliquis over the last 2 days. She took a dose of eliquis this morning. She had a change in mental status and a facial droop and MRI showed a large left cerebellar hemorrhagic infarct and she is answered from an outside hospital to our hospital for further care. She did receive a dose of a reversal agent. She nods that she does not have headache at this time.  Past Medical History  Diagnosis Date  . Breast mass   . Hypertension     Past Surgical History  Procedure Laterality Date  . Breast surgery  1980    biopsy  . Dilation and curettage of uterus    . Foot surgery    . Peripheral vascular catheterization Left 02/28/2015    Procedure: Upper Extremity Angiography;  Surgeon: Renford Dills, MD;  Location: [facility];  Service: Cardiovascular;  Laterality: Left;  . Peripheral vascular catheterization  02/28/2015    Procedure: Upper Extremity Intervention;  Surgeon: Renford Dills, MD;  Location: [facility];  Service: Cardiovascular;;  . Embolectomy Left 02/28/2015    Procedure: EMBOLECTOMY ;  Surgeon: Renford Dills, MD;  Location: [facility] ORS;  Service: Vascular;  Laterality: Left;    No Known Allergies  Social History  Substance Use Topics  . Smoking status: Never Smoker   . Smokeless tobacco: Never Used  . Alcohol Use: No    Family History  Problem Relation Age of Onset  . Emphysema Father   . Cancer Brother     lung  . Cancer Sister     breast  . Cancer Maternal Aunt     ovarian  . Diabetes type II Mother   .  Diabetes type II Sister     x3     Review of Systems  Positive ROS: Unable to obtain  All other systems have been reviewed and were otherwise negative with the exception of those mentioned in the HPI and as above.  Objective: Vital signs in last 24 hours: Temp:  [97.7 F (36.5 C)-99.1 F (37.3 C)] 98.4 F (36.9 C) (01/21 2000) Pulse Rate:  [62-82] 62 (01/21 2200) Resp:  [14-21] 14 (01/21 2200) BP: (128-177)/(57-114) 131/68 mmHg (01/21 2200) SpO2:  [81 %-100 %] 100 % (01/21 2200) FiO2 (%):  [4 %-50 %] 50 % (01/21 2000) Weight:  [90.1 kg (198 lb 10.2 oz)-90.13 kg (198 lb 11.2 oz)] 90.1 kg (198 lb 10.2 oz) (01/21 2200)  General Appearance: Elderly female lying in a gurney intubated Head: Normocephalic, without obvious abnormality, atraumatic Eyes: PERRL, conjunctiva/corneas clear, gaze conjugate   Ears: Normal TM's and external ear canals, both ears Throat: Intubated Neck: Supple, symmetrical, trachea midline Lungs:  respirations unlabored Heart: Regular rate and rhythm Abdomen: Soft Extremities: Bruising to left subclavicular region   NEUROLOGIC:   Mental status: Opens eyes to voice and will answer some yes no questions and follow commands briskly in all 4 extremities Motor Exam - grossly normal, normal tone and bulk Sensory Exam - unable to adequately test Reflexes: symmetric, no pathologic reflexes, No Hoffman's,  No clonus Coordination - not tested Gait - not tested Balance - not tested Cranial Nerves: I: smell Not tested  II: visual acuity  OS: na    OD: na  II: visual fields   II: pupils Equal, round, reactive to light  III,VII: ptosis   III,IV,VI: extraocular muscles  Full ROM  V: mastication   V: facial light touch sensation    V,VII: corneal reflex  Present  VII: facial muscle function - upper    VII: facial muscle function - lower   VIII: hearing Not tested  IX: soft palate elevation    IX,X: gag reflex Present  XI: trapezius strength  5/5  XI:  sternocleidomastoid strength 5/5  XI: neck flexion strength  5/5  XII: tongue strength      Data Review Lab Results  Component Value Date   WBC 15.1* 03/03/2015   HGB 11.2* 03/03/2015   HCT 33.9* 03/03/2015   MCV 91.1 03/03/2015   PLT 191 03/03/2015   Lab Results  Component Value Date   NA 140 03/03/2015   K 3.4* 03/03/2015   CL 102 03/03/2015   CO2 25 03/03/2015   BUN 20 03/03/2015   CREATININE 0.80 03/03/2015   GLUCOSE 174* 03/03/2015   Lab Results  Component Value Date   INR 1.06 02/27/2015    Radiology: Dg Chest 1 View  03/03/2015  CLINICAL DATA:  Acute onset of hypoxia. Cough and shortness of breath. Insert macro EXAM: CHEST 1 VIEW COMPARISON:  Chest radiograph performed 02/27/2015 FINDINGS: The lungs are well-aerated. Patchy right basilar airspace opacity raises concern for pneumonia. The appearance is less typical for asymmetric pulmonary edema. Underlying vascular congestion is noted. Mild left perihilar opacity is seen. No pleural effusion or pneumothorax is identified. The cardiomediastinal silhouette is mildly enlarged. No acute osseous abnormalities are seen. IMPRESSION: 1. Patchy right basilar airspace opacity raises concern for pneumonia. The appearance is less typical for asymmetric pulmonary edema. Mild left perihilar opacity seen. 2. Underlying vascular congestion and mild cardiomegaly. Electronically Signed   By: Roanna Raider M.D.   On: 03/03/2015 01:06   Dg Abd 1 View  03/03/2015  CLINICAL DATA:  Status post enteric catheter placement. EXAM: ABDOMEN - 1 VIEW COMPARISON:  None. FINDINGS: Enteric catheter seen with tip overlying gastric body. The bowel gas pattern is normal. No radiographic evidence of organomegaly. No radio-opaque calculi or other significant radiographic abnormality are seen. IMPRESSION: Enteric catheter with tip overlying the gastric body. Electronically Signed   By: Ted Mcalpine M.D.   On: 03/03/2015 19:05   Mr Laqueta Jean ZO  Contrast  03/03/2015  CLINICAL DATA:  82 year old hypertensive female with altered mental status and facial weakness. Subsequent encounter. EXAM: MRI HEAD WITHOUT AND WITH CONTRAST TECHNIQUE: Multiplanar, multiecho pulse sequences of the brain and surrounding structures were obtained without and with intravenous contrast. CONTRAST:  18mL MULTIHANCE GADOBENATE DIMEGLUMINE 529 MG/ML IV SOLN COMPARISON:  02/26/2014. 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 Interval slight increase in size of ventricles compared to recent CT consistent with developing hydrocephalus. Patient is un safe for lumbar puncture. Mild small vessel disease changes. No intracranial mass separate from above described findings. Fast technique imaging had to be utilized. Left vertebral artery is at least partially patent. Right vertebral artery, basilar artery and internal carotid arteries are patent. Limited assessment of the left posterior inferior cerebellar artery. IMPRESSION: Large acute hemorrhagic left mid to inferior cerebellar infarct with  associated mass effect compressing the fourth ventricle. Interval slight increase in size of ventricles compared to recent CT consistent with developing hydrocephalus. Left vertebral artery is at least partially patent. Limited assessment of the left posterior inferior cerebellar artery. These results will be called to the ordering clinician or representative by the Radiologist Assistant, and communication documented in the PACS or zVision Dashboard. Electronically Signed   By: Lacy Duverney M.D.   On: 03/03/2015 16:55   Portable Chest Xray  03/03/2015  CLINICAL DATA:  Admission chest radiograph. Known hemorrhagic infarct. Initial encounter. EXAM: PORTABLE CHEST 1 VIEW COMPARISON:  Chest radiograph performed earlier today at 6:39 p.m. FINDINGS: The patient's endotracheal tube is seen ending 2-3 cm above the  carina. An enteric tube is noted extending below the diaphragm. Bilateral central airspace opacity raises concern for pulmonary edema. No pleural effusion or pneumothorax is seen. The cardiomediastinal silhouette is mildly enlarged. No acute osseous abnormalities are identified. IMPRESSION: 1. Endotracheal tube seen ending 2-3 cm above the carina. 2. Bilateral central airspace opacity is perhaps slightly worsened and raises concern for pulmonary edema. Mild cardiomegaly. Electronically Signed   By: Roanna Raider M.D.   On: 03/03/2015 23:00   Dg Chest Port 1 View  03/03/2015  CLINICAL DATA:  82 year old female being transferred to [facility] from [facility] , intubated and sedated now. Cough and shortness of breath. Initial encounter. EXAM: PORTABLE CHEST 1 VIEW COMPARISON:  [facility] 0022 hours today. FINDINGS: Portable AP semi upright view at 1839 hours. Endotracheal tube tip in good position between the level the clavicles and carina. Enteric tube courses to the left upper quadrant, tip not included. Interval worsening confluence of right lung base opacity, favor lower lobe. Upper lobes appear stable. Left lung base is stable and probably unaffected. No superimposed pneumothorax or pulmonary edema. No pleural effusion identified. IMPRESSION: 1. Endotracheal tube tip in good position. Enteric tube courses to the abdomen. 2. Radiographic progression of right lung base pneumonia and/or aspiration since earlier today. No pleural effusion identified. Electronically Signed   By: Odessa Fleming M.D.   On: 03/03/2015 19:04     Assessment/Plan: Large left acute hemorrhagic infarct of the left cerebellum, likely embolic with hemorrhagic conversion secondary to anticoagulation. At present her mental status is actually quite good in that she will open her eyes to voice and follow commands briskly and denies headache. She does not have hydrocephalus on her MRI though she is  certainly at risk of developing this over time. She is not a good candidate for surgical intervention since she received eloquent stress today and has a half life of 12 hours. The cerebellum is already friable in the best of circumstances and the eliquis would likely make intraoperative bleeding difficult to control. Therefore think surgery at this point is more risky than beneficial. She does not have hydrocephalus and therefore does not need a ventriculostomy at this point. This would also lead to the risk of operative herniation by lowering the supratentorial pressure.  Prognosis is quite guarded. It is also difficult because she has had at least 2 embolic phenomenon and she cannot be on anticoagulants now making her at risk of further stroke elsewhere. She will also likely be ataxic from her infarct making her fall risk increase dramatically, and therefore further increasing the risk of anticoagulation. At this point I'm not sure how aggressive the family would want to be given her advanced age and the complexity of the situation. I am  not sure I would recommend aggressive surgical intervention even in the face of deterioration. A ventriculostomy would not be unreasonable for a short time she should develop hydrocephalus, but I think the likelihood of return to full functional premorbid status is quite small. Therefore quality of life must be considered when making these complex decisions.  Agree with repeat head CT in the morning. Suggest neurology input. Limit sedation as much as possible to follow neurologic exam in the ICU.   Leandrew Keech S 03/03/2015 11:08 PM

## 2015-03-03 NOTE — Progress Notes (Signed)
Nursing note - Pt found attempting to get OOB without assistance, incontinent of urine, Increased work of breathing.  Expiratory wheezing noted on exam, sats 81% on RA - placed in 4L [place] to achieved sats 92-93%.  MD Anne Hahn paged of findings.  Stat CXR ordered and Duoneb treatment, pt placed on continuous pulse ox per MD WIllis.    Pt given Duoneb treatment  by respiratory with no improvement of sats.  Pt remains A&O X4, given tylenol for headache.  Pt reports she is at her baseline, but keeps asking to go home.  Will continue to monitor.

## 2015-03-03 NOTE — Progress Notes (Signed)
Addendum to eval: Dysphagia 2 diet ordered with honey thick liquids

## 2015-03-03 NOTE — Progress Notes (Signed)
Bedside swallow eval completed earlier today. Spoke to MD on the phone re concerns of Pt's Dyshagia, dysphonia with breathy voice and inability to produce a strong cough. Pt also noted to use her left arm (surgery arm) rather than her right arm when attempting to feed self from the cup. Noted some difficulty with coordination when attempting to reach for the cup as well. MD plans to order MRI to r/o CVA. Bedside swallow eval revealed Dysphagia with overt s/s of aspiration with thinner liquids. See bedside swallow eval for details.

## 2015-03-03 NOTE — Progress Notes (Signed)
E-link notified of patient's arrival to CCU.

## 2015-03-03 NOTE — Progress Notes (Signed)
DR Allena Katz was made aware of pt's change in condition from 03/02/15, as pt is unable to maintain her balance when sitting up with slurred speech and slight drooping of her mouth to the left , all grips where strong and equal with pupils reactive to light , stated she will come and evaluate pt., pt bp slightly high AM HTN drugs will be given , drowsy but  oriented at this time, will continue to monitor.

## 2015-03-03 NOTE — Progress Notes (Signed)
ANTIBIOTIC CONSULT NOTE - INITIAL  Pharmacy Consult for Vancomycin/Zosyn Indication: pneumonia  No Known Allergies  Patient Measurements: Height:  (172.7 cm) Weight: 199 lb 1.6 oz (90.311 kg) IBW/kg (Calculated) : 63.9 Adjusted Body Weight: 74.1 kg  Vital Signs: Temp: 97.7 F (36.5 C) (01/21 0022) Temp Source: Oral (01/20 1939) BP: 160/114 mmHg (01/21 0022) Pulse Rate: 75 (01/21 0022) Intake/Output from previous day: 01/20 0701 - 01/21 0700 In: 409.4 [P.O.:100; I.V.:309.4] Out: 400 [Urine:400] Intake/Output from this shift: Total I/O In: 100 [P.O.:100] Out: 50 [Urine:50]  Labs:  Recent Labs  03/01/15 0103 03/02/15 0746  WBC 8.8 13.0*  HGB 10.2* 11.0*  PLT 201 184  CREATININE 1.23* 0.72   Estimated Creatinine Clearance: 64.9 mL/min (by C-G formula based on Cr of 0.72). No results for input(s): VANCOTROUGH, VANCOPEAK, VANCORANDOM, GENTTROUGH, GENTPEAK, GENTRANDOM, TOBRATROUGH, TOBRAPEAK, TOBRARND, AMIKACINPEAK, AMIKACINTROU, AMIKACIN in the last 72 hours.   Microbiology: No results found for this or any previous visit (from the past 720 hour(s)).  Medical History: Past Medical History  Diagnosis Date  . Breast mass   . Hypertension     Medications:  Infusions:   Assessment: 83 yof cc arm pain admitted 02/28/15 developed hypoxia this evening, CXR concerning for PNA. Pharmacy consulted to dose vancomycin and Zosyn.   Vd 51.9 L, Ke 0.058 hr-1, T1/2 12 hr  Goal of Therapy:  Vancomycin trough level 15-20 mcg/ml  Plan:  Expected duration 7 days with resolution of temperature and/or normalization of WBC. Zosyn 3.375 gm IV Q8H EI and vancomycin 1 gm IV Q12H with stacked dosing, second dose approximately 6 hours after first, predicted trough 19 mcg/mL. Pharmacy will continue to follow and adjust as needed to maintain trough 15 to 20 mcg/mL.  Carola Frost, Pharm.D., BCPS Clinical Pharmacist 03/03/2015,3:04 AM

## 2015-03-03 NOTE — Evaluation (Signed)
Clinical/Bedside Swallow Evaluation Patient Details  Name: Nancy Hahn MRN: [id number] Date of Birth: [birth year]  Today's Date: 03/03/2015 Time: SLP Start Time (ACUTE ONLY): 1300 SLP Stop Time (ACUTE ONLY): 1400 SLP Time Calculation (min) (ACUTE ONLY): 60 min  Past Medical History:  Past Medical History  Diagnosis Date  . Breast mass   . Hypertension    Past Surgical History:  Past Surgical History  Procedure Laterality Date  . Breast surgery  1980    biopsy  . Dilation and curettage of uterus    . Foot surgery    . Peripheral vascular catheterization Left 02/28/2015    Procedure: Upper Extremity Angiography;  Surgeon: Renford Dills, MD;  Location: [facility];  Service: Cardiovascular;  Laterality: Left;  . Peripheral vascular catheterization  02/28/2015    Procedure: Upper Extremity Intervention;  Surgeon: Renford Dills, MD;  Location: [facility];  Service: Cardiovascular;;  . Embolectomy Left 02/28/2015    Procedure: EMBOLECTOMY ;  Surgeon: Renford Dills, MD;  Location: [facility] ORS;  Service: Vascular;  Laterality: Left;   HPI:    HPI: The patient presents emergency department complaining of pain in her left upper extremity. She states that it began in the shower after she had completed her housework approximately 10 hours prior to admission. At first she states her arm was very painful but then became numb. She denies trouble swallowing, speaking or ambulating. She has had no visual symptoms. She also denies headache, chest pain, palpitations, nausea or vomiting. In the emergency department she was found to have cyanotic appearing fingers of the left hand which was also cold to touch. Vascular surgery was consulted who recommended beginning heparin in the emergency department staff called for admission.  Assessment / Plan / Recommendation Clinical Impression  Pt presents with overt s/s of aspiration with thin liquids characterized by nonproductive, weak  coughing. Oral mech exam revealed slight right facial down swing on the right at rest, however, during oral motor movements, right facial and labial mucscles appeared weaker than the left. Pts voice was breathy with inability to produce a strong voice or cough when cued not typical of a 'sore throat' as Pt describes. Pt tolerated purees and honey thick liquids without s./s of aspiration. Oral transit delay with solids, mild oral residue which Pt cleared with cues. Pt was able to self feed when assisted. Noted some discoordination with grasping the cup. Pt also tended to use her nondominant arm and hand to feed. Discussed these concerns along with the new onset of Dysphonia and Dysphgia with MD and Nursing. MD plans to order MRI to r/o CVA. Rec Dysphagia 3 diet with honey thick liquids for now. Moderate risk for aspiration since Pt is not able to adequately protect her airway with a strong cough. Mild Dysarthria also noted but may be related to drowsiness. ST to follow Pt and alter diet as appropriate. If MRI is negative and Pt's voice does not return in the next few days, MD may want to consder ENT consult.    Aspiration Risk   moderate    Diet Recommendation   Dysphagia 3 with honey thick liquids  Medication Administration: Whole meds with puree    Other  Recommendations     Follow up Recommendations       Frequency and Duration min 3x week          Prognosis Prognosis for Safe Diet Advancement: Fair      Swallow Study  General Date of Onset: 03/02/15 Type of Study: Bedside Swallow Evaluation Diet Prior to this Study: Regular Respiratory Status: Nasal cannula History of Recent Intubation:  (Recent Sx) Behavior/Cognition: Pleasant mood Oral Cavity Assessment: Within Functional Limits Oral Care Completed by SLP: No Self-Feeding Abilities: Needs assist Patient Positioning: Upright in bed Baseline Vocal Quality: Hoarse;Breathy;Other (comment) (Pt unable to produce a strong voice or  cough) Volitional Cough: Weak    Oral/Motor/Sensory Function Overall Oral Motor/Sensory Function: Mild impairment Facial ROM: Reduced right Facial Symmetry: Abnormal symmetry right Facial Strength: Within Functional Limits Facial Sensation: Within Functional Limits Lingual ROM: Within Functional Limits Lingual Symmetry: Within Functional Limits Lingual Strength: Within Functional Limits Lingual Sensation: Within Functional Limits Velum: Within Functional Limits Mandible: Within Functional Limits   Ice Chips Ice chips: Within functional limits Presentation: Cup;Spoon   Thin Liquid Thin Liquid: Impaired Presentation: Spoon;Cup Oral Phase Impairments: Reduced labial seal Pharyngeal  Phase Impairments: Suspected delayed Swallow;Cough - Immediate    Nectar Thick     Honey Thick Honey Thick Liquid: Within functional limits Presentation: Cup;Spoon   Puree Puree: Within functional limits Presentation: Self Fed   Solid   GO   Solid: Impaired Presentation: Self Fed Oral Phase Impairments: Reduced labial seal;Reduced lingual movement/coordination;Impaired mastication Oral Phase Functional Implications: Oral residue    Functional Limitations: Swallowing   Eather Colas 03/03/2015,3:38 PM

## 2015-03-03 NOTE — Progress Notes (Signed)
Called pt was hypoxic and increased work of breathing.  CXR concerning for PNA.  ABX started.  Cultures ordered.  Kristeen Miss [facility] 03/03/2015, 2:53 AM

## 2015-03-03 NOTE — Progress Notes (Signed)
ANTIBIOTIC CONSULT NOTE - INITIAL  Pharmacy Consult for Levaquin  Indication: HCAP   No Known Allergies  Patient Measurements: Height:  (172.7 cm) Weight: 198 lb 11.2 oz (90.13 kg) IBW/kg (Calculated) : 63.9 Adjusted Body Weight:   Vital Signs: Temp: 99.1 F (37.3 C) (01/21 0840) Temp Source: Oral (01/21 0840) BP: 164/64 mmHg (01/21 0840) Pulse Rate: 72 (01/21 0840) Intake/Output from previous day: 01/20 0701 - 01/21 0700 In: 659.4 [P.O.:100; I.V.:309.4; IV Piggyback:250] Out: 400 [Urine:400] Intake/Output from this shift:    Labs:  Recent Labs  03/01/15 0103 03/02/15 0746 03/03/15 0458  WBC 8.8 13.0* 15.1*  HGB 10.2* 11.0* 11.2*  PLT 201 184 191  CREATININE 1.23* 0.72 0.80   Estimated Creatinine Clearance: 64.8 mL/min (by C-G formula based on Cr of 0.8). No results for input(s): VANCOTROUGH, VANCOPEAK, VANCORANDOM, GENTTROUGH, GENTPEAK, GENTRANDOM, TOBRATROUGH, TOBRAPEAK, TOBRARND, AMIKACINPEAK, AMIKACINTROU, AMIKACIN in the last 72 hours.   Microbiology: No results found for this or any previous visit (from the past 720 hour(s)).  Medical History: Past Medical History  Diagnosis Date  . Breast mass   . Hypertension     Medications:  Scheduled:  . apixaban  10 mg Oral BID  . [START ON 03/09/2015] apixaban  5 mg Oral BID  . aspirin EC  81 mg Oral Daily  . atenolol  50 mg Oral Daily  . docusate sodium  100 mg Oral BID  . levofloxacin (LEVAQUIN) IV  750 mg Intravenous Q24H  . lisinopril  40 mg Oral Daily  . NIFEdipine  90 mg Oral Daily  . simvastatin  20 mg Oral Daily  . sodium chloride  3 mL Intravenous Q12H   Assessment: CrCl = 64.8 ml/min   Goal of Therapy:  resolution of infection   Plan:  Expected duration 7 days with resolution of temperature and/or normalization of WBC  Levaquin 750 mg IV Q24H ordered to start 1/21 .   Leightyn Cina D 03/03/2015,9:37 AM

## 2015-03-04 ENCOUNTER — Inpatient Hospital Stay (HOSPITAL_COMMUNITY): Payer: Commercial Managed Care - HMO

## 2015-03-04 DIAGNOSIS — J189 Pneumonia, unspecified organism: Secondary | ICD-10-CM

## 2015-03-04 DIAGNOSIS — J9601 Acute respiratory failure with hypoxia: Secondary | ICD-10-CM

## 2015-03-04 DIAGNOSIS — I619 Nontraumatic intracerebral hemorrhage, unspecified: Secondary | ICD-10-CM

## 2015-03-04 DIAGNOSIS — I614 Nontraumatic intracerebral hemorrhage in cerebellum: Secondary | ICD-10-CM

## 2015-03-04 LAB — GLUCOSE, CAPILLARY
GLUCOSE-CAPILLARY: 94 mg/dL (ref 65–99)
GLUCOSE-CAPILLARY: 82 mg/dL (ref 65–99)
Glucose-Capillary: 101 mg/dL — ABNORMAL HIGH (ref 65–99)
Glucose-Capillary: 110 mg/dL — ABNORMAL HIGH (ref 65–99)
Glucose-Capillary: 82 mg/dL (ref 65–99)
Glucose-Capillary: 97 mg/dL (ref 65–99)

## 2015-03-04 LAB — CBC WITH DIFFERENTIAL/PLATELET
Basophils Absolute: 0 10*3/uL (ref 0.0–0.1)
Basophils Relative: 0 %
EOS ABS: 0 10*3/uL (ref 0.0–0.7)
EOS PCT: 0 %
HEMATOCRIT: 27.3 % — AB (ref 36.0–46.0)
Hemoglobin: 9 g/dL — ABNORMAL LOW (ref 12.0–15.0)
LYMPHS ABS: 0.8 10*3/uL (ref 0.7–4.0)
Lymphocytes Relative: 8 %
MCH: 30.1 pg (ref 26.0–34.0)
MCHC: 33 g/dL (ref 30.0–36.0)
MCV: 91.3 fL (ref 78.0–100.0)
MONO ABS: 0.4 10*3/uL (ref 0.1–1.0)
Monocytes Relative: 4 %
NEUTROS PCT: 88 %
Neutro Abs: 8.5 10*3/uL — ABNORMAL HIGH (ref 1.7–7.7)
PLATELETS: 152 10*3/uL (ref 150–400)
RBC: 2.99 MIL/uL — AB (ref 3.87–5.11)
RDW: 13.6 % (ref 11.5–15.5)
WBC: 9.7 10*3/uL (ref 4.0–10.5)

## 2015-03-04 LAB — PROCALCITONIN: PROCALCITONIN: 1.03 ng/mL

## 2015-03-04 LAB — MRSA PCR SCREENING: MRSA by PCR: NEGATIVE

## 2015-03-04 MED ORDER — MAGNESIUM SULFATE 2 GM/50ML IV SOLN
2.0000 g | Freq: Once | INTRAVENOUS | Status: AC
  Administered 2015-03-04: 2 g via INTRAVENOUS
  Filled 2015-03-04: qty 50

## 2015-03-04 MED ORDER — VANCOMYCIN HCL IN DEXTROSE 1-5 GM/200ML-% IV SOLN
1000.0000 mg | Freq: Two times a day (BID) | INTRAVENOUS | Status: DC
  Administered 2015-03-04 – 2015-03-06 (×4): 1000 mg via INTRAVENOUS
  Filled 2015-03-04 (×5): qty 200

## 2015-03-04 MED ORDER — PIPERACILLIN-TAZOBACTAM 3.375 G IVPB
3.3750 g | Freq: Three times a day (TID) | INTRAVENOUS | Status: DC
  Administered 2015-03-04 – 2015-03-06 (×6): 3.375 g via INTRAVENOUS
  Filled 2015-03-04 (×8): qty 50

## 2015-03-04 MED ORDER — VANCOMYCIN HCL 10 G IV SOLR
1500.0000 mg | Freq: Once | INTRAVENOUS | Status: AC
  Administered 2015-03-04: 1500 mg via INTRAVENOUS
  Filled 2015-03-04: qty 1500

## 2015-03-04 MED ORDER — LEVOFLOXACIN IN D5W 500 MG/100ML IV SOLN: 500.0000 mg | INTRAVENOUS | Status: DC

## 2015-03-04 MED ORDER — CHLORHEXIDINE GLUCONATE 0.12 % MT SOLN
OROMUCOSAL | Status: AC
  Administered 2015-03-04: 21:00:00
  Filled 2015-03-04: qty 15

## 2015-03-04 NOTE — Progress Notes (Signed)
Increased RR to 20, PEEP back to 10 cmH2O, and O2 back to 50% due to desaturation of 78%.

## 2015-03-04 NOTE — Progress Notes (Signed)
ANTIBIOTIC CONSULT NOTE - INITIAL  Pharmacy Consult for vancomycin + zosyn Indication: rule out pneumonia  No Known Allergies  Patient Measurements: Weight: 198 lb 10.2 oz (90.1 kg) Adjusted Body Weight:   Vital Signs: Temp: 98.7 F (37.1 C) (01/22 0800) Temp Source: Axillary (01/22 0800) BP: 143/64 mmHg (01/22 0800) Pulse Rate: 70 (01/22 0800) Intake/Output from previous day: 01/21 0701 - 01/22 0700 In: 55.6 [I.V.:55.6] Out: 500 [Urine:200; Emesis/NG output:300] Intake/Output from this shift: Total I/O In: 16.2 [I.V.:16.2] Out: 60 [Urine:60]  Labs:  Recent Labs  03/02/15 0746 03/03/15 0458 03/03/15 2300  WBC 13.0* 15.1* 9.7  HGB 11.0* 11.2* 9.0*  PLT 184 191 152  CREATININE 0.72 0.80 0.85   Estimated Creatinine Clearance: 61 mL/min (by C-G formula based on Cr of 0.85). No results for input(s): VANCOTROUGH, VANCOPEAK, VANCORANDOM, GENTTROUGH, GENTPEAK, GENTRANDOM, TOBRATROUGH, TOBRAPEAK, TOBRARND, AMIKACINPEAK, AMIKACINTROU, AMIKACIN in the last 72 hours.   Microbiology: Recent Results (from the past 720 hour(s))  MRSA PCR Screening     Status: None   Collection Time: 03/03/15 11:01 PM  Result Value Ref Range Status   MRSA by PCR NEGATIVE NEGATIVE Final    Comment:        The GeneXpert MRSA Assay (FDA approved for NASAL specimens only), is one component of a comprehensive MRSA colonization surveillance program. It is not intended to diagnose MRSA infection nor to guide or monitor treatment for MRSA infections.     Medical History: Past Medical History  Diagnosis Date  . Breast mass   . Hypertension     Medications:  Anti-infectives    Start     Dose/Rate Route Frequency Ordered Stop   03/05/15 1000  levofloxacin (LEVAQUIN) IVPB 500 mg  Status:  Discontinued     500 mg 100 mL/hr over 60 Minutes Intravenous Every 24 hours 03/04/15 0203 03/04/15 0820   03/04/15 2200  vancomycin (VANCOCIN) IVPB 1000 mg/200 mL premix     1,000 mg 200 mL/hr over 60  Minutes Intravenous Every 12 hours 03/04/15 0846     03/04/15 1000  piperacillin-tazobactam (ZOSYN) IVPB 3.375 g     3.375 g 12.5 mL/hr over 240 Minutes Intravenous Every 8 hours 03/04/15 0846     03/04/15 1000  vancomycin (VANCOCIN) 1,500 mg in sodium chloride 0.9 % 500 mL IVPB     1,500 mg 250 mL/hr over 120 Minutes Intravenous  Once 03/04/15 0846       Assessment: 83 yof presented from [facility] s/p hemorrhagic infarct. Had been started on levaquin but now broadening coverage to vancomycin and zosyn for pneumonia. Pt is afebrile and WBC is WNL. Scr is WNL.   Vanc 1/22>> Zosyn x 1 1/21; 1/22>> Levaquin x 1 1/21  1/21 MRSA - NEG  Goal of Therapy:  Vancomycin trough level 15-20 mcg/ml  Plan:  - Zosyn 3.375gm IV Q8H (4 hr inf) - Vanc  IV x 1 then 1gm IV Q12H - F/u renal fxn, C&S, clinical status and trough at [facility]  Lariza Cothron, Drake Leach 03/04/2015,8:46 AM

## 2015-03-04 NOTE — Progress Notes (Addendum)
STROKE TEAM PROGRESS NOTE   HISTORY OF PRESENT ILLNESS Nancy Hahn is an 82 y.o. female with a history of a breast mass and hypertension who was admitted to [facility] on 02/26/2014 for acute left subclavian artery thrombosis. She underwent thrombectomy on 02/28/2015. Patient was on IV heparin initially and was changed to Eliquis. She became febrile overnight with a throat and coughing with chest x-ray findings consistent with aspiration pneumonia. She also developed a change in mental status and an MRI which is a large acute hemorrhagic left cerebellar infarction mass effect on fourth ventricle. A shunt had not developed hydrocephalus, however. She was intubated on mechanical ventilation and subsequently transferred to [facility] for further management. Reversal of anticoagulation with Eloquis was gone.  LSN: Unclear tPA Given: No: Acute hemorrhagic cerebral infarction in the setting of anticoagulation with Eliquis. mRankin:   SUBJECTIVE (INTERVAL HISTORY) She is neurologically stable. No family at bedside.   OBJECTIVE Temp:  [97.8 F (36.6 C)-99.1 F (37.3 C)] 98.3 F (36.8 C) (01/22 0400) Pulse Rate:  [54-80] 60 (01/22 0700) Cardiac Rhythm:  [-] Normal sinus rhythm (01/21 2200) Resp:  [13-21] 15 (01/22 0700) BP: (105-171)/(52-71) 115/55 mmHg (01/22 0700) SpO2:  [94 %-100 %] 98 % (01/22 0700) FiO2 (%):  [40 %-50 %] 40 % (01/22 0600) Weight:  [90.1 kg (198 lb 10.2 oz)] 90.1 kg (198 lb 10.2 oz) (01/22 0500)  CBC:  Recent Labs Lab 03/01/15 0103  03/03/15 0458 03/03/15 2300  WBC 8.8  < > 15.1* 9.7  NEUTROABS 6.6*  --   --  8.5*  HGB 10.2*  < > 11.2* 9.0*  HCT 31.1*  < > 33.9* 27.3*  MCV 89.4  < > 91.1 91.3  PLT 201  < > 191 152  < > = values in this interval not displayed.  Basic Metabolic Panel:  Recent Labs Lab 03/01/15 0103  03/03/15 0458 03/03/15 2300  NA 139  < > 140 141  K 3.8  < > 3.4* 3.8  CL 111  < > 102 109  CO2 24  < > 25 23  GLUCOSE 144*   < > 174* 141*  BUN 20  < > 20 20  CREATININE 1.23*  < > 0.80 0.85  CALCIUM 8.1*  < > 8.6* 8.2*  MG  --   --   --  1.9  PHOS 3.4  --   --  2.4*  < > = values in this interval not displayed.  Lipid Panel:    Component Value Date/Time   TRIG 60 03/03/2015 2300   HgbA1c:  Lab Results  Component Value Date   HGBA1C 6.6* 02/27/2015   Urine Drug Screen: No results found for: LABOPIA, COCAINSCRNUR, LABBENZ, AMPHETMU, THCU, LABBARB    IMAGING  Dg Chest 1 View  03/03/2015   1. Patchy right basilar airspace opacity raises concern for pneumonia. The appearance is less typical for asymmetric pulmonary edema. Mild left perihilar opacity seen.  2. Underlying vascular congestion and mild cardiomegaly.    Dg Abd 1 View 03/03/2015   Enteric catheter with tip overlying the gastric body.      Mr Laqueta Jean Wo Contrast 03/03/2015   Large acute hemorrhagic left mid to inferior cerebellar infarct with associated mass effect compressing the fourth ventricle. Interval slight increase in size of ventricles compared to recent CT consistent with developing hydrocephalus. Left vertebral artery is at least partially patent. Limited assessment of the left posterior inferior cerebellar artery.  Portable Chest Xray 03/03/2015   1. Endotracheal tube seen ending 2-3 cm above the carina.  2. Bilateral central airspace opacity is perhaps slightly worsened and raises concern for pulmonary edema. Mild cardiomegaly.   Dg Chest Port 1 View 03/03/2015   1. Endotracheal tube tip in good position. Enteric tube courses to the abdomen.  2. Radiographic progression of right lung base pneumonia and/or aspiration since earlier today. No pleural effusion identified.   PHYSICAL EXAM Frail elderly lady who is intubated . Afebrile. Head is nontraumatic. Neck is supple without bruit. Cardiac exam no murmur or gallop. Lungs are clear to auscultation. Distal pulses are well felt. Neurological Exam : Intubated, very  drowsy. PERRL. Face symmetric. Opens eyes to light stimulation. Averbal. Can follow simple commands, nods to questions. No gaze deviation. Does not track. Conjugate gaze. Spontaneous left arm movements. Withdraws in all extremities but less on the right. Tongue midline. Cough and gag intact. Toes equivocal. Attempted FTN but patient could not perform due to cognition.  Gait: Unable to test      ASSESSMENT/PLAN [name] is a 82 y.o. female with history of breast mass, hypertension, recent aspiration pneumonia, left subclavian artery thrombosisth with thrombectomy on 02/28/2015 and subsequent Eliquis therapy, presenting with altered mental status. She did not receive IV t-PA due to hemorrhage.   Hemorrhagic Stroke:  Dominant   Resultant  Is intubated and sedated, appears to have decreased movements on the right but difficult exam due to cognition.   MRI  - Large acute hemorrhagic left mid to inferior cerebellar infarct with associated mass effect.  MRA - not performed  Carotid Doppler - not indicated  2D Echo - EF 55-60%. No cardiac source of emboli identified.  LDL - not performed  HgbA1c not performed  VTE prophylaxis - SCDs   Diet NPO time specified  aspirin 81 mg daily prior to admission, now on No antithrombotic secondary to hemorrhage.  Ongoing aggressive stroke risk factor management  Therapy recommendations: Pending  Disposition:  Pending  Hypertension  Blood pressure tends to run low   Other Stroke Risk Factors  Advanced age  Obesity, Body mass index is 30.21 kg/(m^2).    Other Active Problems  Anemia - monitor  Pneumonia - CCM following - on Zosyn and vancomycin - Day #1    Hospital day # 1  Delton See PA-C [facility] Pager [phone] 03/04/2015, 11:39 AM   Personally examined patient and images, and have participated in and made any corrections needed to history, physical, neuro exam,assessment and plan as stated  above.  I have personally obtained the history, evaluated lab date, reviewed imaging studies and agree with radiology interpretations. Patient was admitted to [facility] for acute subclavian artery thrombosis and thrombectomy, she was on IV heparin initially and was changed to Eliquis. She had a change in MS found to have hemorrhagic left cerebellar infarction with mass effect on fourth ventricle.  She was intubated on mechanical ventilation and subsequently transferred to [facility] for further management. Repeat Ct today was stable and she is neurologically stable. NSY evaluated and does not recommend intervention at this time. If she develops hydrocephalus she would be a candidate for ventriculostomy placement.  This patient is critically ill and at significant risk of neurological worsening, death and care requires constant monitoring of vital signs, hemodynamics,respiratory and cardiac monitoring,review of multiple databases, neurological assessment, discussion with family, other specialists and medical decision making of high complexity.I  I spent 30 minutes of neurocritical  care time in the care of this patient.  Naomie Dean, MD [facility] Pager: [phone] 05/05/2014 5:09 PM     To contact Stroke Continuity provider, please refer to [email or web address]. After hours, contact General Neurology

## 2015-03-04 NOTE — Consult Note (Signed)
Admission H&P    Chief Complaint: Acute left cerebellar hemorrhagic infarction.  HPI: Nancy Hahn is an 82 y.o. female with a history of a breast mass and hypertension who was admitted to [facility] on 02/26/2014 for acute left subclavian artery thrombosis. She underwent thrombectomy on 02/28/2015. Patient was on IV heparin initially and was changed to Eliquis. She became febrile overnight with a throat and coughing with chest x-ray findings consistent with aspiration pneumonia. She also developed a change in mental status and an MRI which is a large acute hemorrhagic left cerebellar infarction mass effect on fourth ventricle. A shunt had not developed hydrocephalus, however. She was intubated on mechanical ventilation and subsequently transferred to [facility] for further management. Reversal of anticoagulation with Eloquis was gone.  LSN: Unclear tPA Given: No: Acute hemorrhagic cerebral infarction in the setting of anticoagulation with Eliquis. mRankin:  Past Medical History  Diagnosis Date  . Breast mass   . Hypertension     Past Surgical History  Procedure Laterality Date  . Breast surgery  1980    biopsy  . Dilation and curettage of uterus    . Foot surgery    . Peripheral vascular catheterization Left 02/28/2015    Procedure: Upper Extremity Angiography;  Surgeon: Katha Cabal, MD;  Location: [place] [facility];  Service: Cardiovascular;  Laterality: Left;  . Peripheral vascular catheterization  02/28/2015    Procedure: Upper Extremity Intervention;  Surgeon: Katha Cabal, MD;  Location: [place] [facility];  Service: Cardiovascular;;  . Embolectomy Left 02/28/2015    Procedure: EMBOLECTOMY ;  Surgeon: Katha Cabal, MD;  Location: [facility] ORS;  Service: Vascular;  Laterality: Left;    Family History  Problem Relation Age of Onset  . Emphysema Father   . Cancer Brother     lung  . Cancer Sister     breast  . Cancer Maternal Aunt     ovarian  .  Diabetes type II Mother   . Diabetes type II Sister     x3   Social History:  reports that she has never smoked. She has never used smokeless tobacco. She reports that she does not drink alcohol or use illicit drugs.  Allergies: No Known Allergies  Medications Prior to Admission  Medication Sig Dispense Refill  . aspirin 81 MG tablet Take 81 mg by mouth daily.    Marland Kitchen atenolol (TENORMIN) 50 MG tablet Take 50 mg by mouth daily.    Marland Kitchen lisinopril (PRINIVIL,ZESTRIL) 40 MG tablet Take 40 mg by mouth daily.    Marland Kitchen NIFEdipine (PROCARDIA XL/ADALAT-CC) 90 MG 24 hr tablet Take 90 mg by mouth daily.    . simvastatin (ZOCOR) 20 MG tablet Take 20 mg by mouth daily.      ROS: Unavailable due to patient's sedated state.  Physical Examination: Blood pressure 135/65, pulse 54, temperature 97.8 F (36.6 C), temperature source Axillary, resp. rate 15, weight 90.1 kg (198 lb 10.2 oz), SpO2 100 %.  HEENT-  Normocephalic, no lesions, without obvious abnormality.  Normal external eye and conjunctiva.  Normal TM's bilaterally.  Normal auditory canals and external ears. Normal external nose, mucus membranes and septum.  Normal pharynx. Neck supple with no masses, nodes, nodules or enlargement. Cardiovascular - regular rate and rhythm, S1, S2 normal, no murmur, click, rub or gallop Lungs - chest clear, no wheezing, rales, normal symmetric air entry Abdomen - soft, non-tender; bowel sounds normal; no masses,  no organomegaly Extremities - no joint  deformities, effusion, or inflammation and no edema  Neurologic Examination: Patient was intubated and on mechanical ventilation. She also had spontaneous respirations. The patient with propofol was suspended. She was able to respond with head movements, as well as follow simple commands. Pupils were equal and reacted normally to light. Extraocular movements were intact with oculocephalic maneuvers. Face was symmetrical with no focal weakness. She was able to move  extremities on command and with equal strength throughout. Deep tendon reflexes were 1+ and symmetrical. Plantar responses were mute.  Results for orders placed or performed during the hospital encounter of 03/03/15 (from the past 48 hour(s))  Comprehensive metabolic panel     Status: Abnormal   Collection Time: 03/03/15 11:00 PM  Result Value Ref Range   Sodium 141 135 - 145 mmol/L   Potassium 3.8 3.5 - 5.1 mmol/L   Chloride 109 101 - 111 mmol/L   CO2 23 22 - 32 mmol/L   Glucose, Bld 141 (H) 65 - 99 mg/dL   BUN 20 6 - 20 mg/dL   Creatinine, Ser 0.85 0.44 - 1.00 mg/dL   Calcium 8.2 (L) 8.9 - 10.3 mg/dL   Total Protein 5.5 (L) 6.5 - 8.1 g/dL   Albumin 2.5 (L) 3.5 - 5.0 g/dL   AST 21 15 - 41 U/L   ALT 20 14 - 54 U/L   Alkaline Phosphatase 53 38 - 126 U/L   Total Bilirubin 0.8 0.3 - 1.2 mg/dL   GFR calc non Af Amer >60 >60 mL/min   GFR calc Af Amer >60 >60 mL/min    Comment: (NOTE) The eGFR has been calculated using the CKD EPI equation. This calculation has not been validated in all clinical situations. eGFR's persistently <60 mL/min signify possible Chronic Kidney Disease.    Anion gap 9 5 - 15  Magnesium     Status: None   Collection Time: 03/03/15 11:00 PM  Result Value Ref Range   Magnesium 1.9 1.7 - 2.4 mg/dL  Phosphorus     Status: Abnormal   Collection Time: 03/03/15 11:00 PM  Result Value Ref Range   Phosphorus 2.4 (L) 2.5 - 4.6 mg/dL  CBC WITH DIFFERENTIAL     Status: Abnormal (Preliminary result)   Collection Time: 03/03/15 11:00 PM  Result Value Ref Range   WBC PENDING 4.0 - 10.5 K/uL   RBC 2.99 (L) 3.87 - 5.11 MIL/uL   Hemoglobin 9.0 (L) 12.0 - 15.0 g/dL   HCT 27.3 (L) 36.0 - 46.0 %   MCV 91.3 78.0 - 100.0 fL   MCH 30.1 26.0 - 34.0 pg   MCHC 33.0 30.0 - 36.0 g/dL   RDW 13.6 11.5 - 15.5 %   Platelets PENDING 150 - 400 K/uL   Neutrophils Relative % PENDING %   Neutro Abs PENDING 1.7 - 7.7 K/uL   Band Neutrophils PENDING %   Lymphocytes Relative PENDING %    Lymphs Abs PENDING 0.7 - 4.0 K/uL   Monocytes Relative PENDING %   Monocytes Absolute PENDING 0.1 - 1.0 K/uL   Eosinophils Relative PENDING %   Eosinophils Absolute PENDING 0.0 - 0.7 K/uL   Basophils Relative PENDING %   Basophils Absolute PENDING 0.0 - 0.1 K/uL   WBC Morphology PENDING    RBC Morphology PENDING    Smear Review PENDING    nRBC PENDING 0 /100 WBC   Metamyelocytes Relative PENDING %   Myelocytes PENDING %   Promyelocytes Absolute PENDING %   Blasts PENDING %  Protime-INR  Status: Abnormal   Collection Time: 03/03/15 11:00 PM  Result Value Ref Range   Prothrombin Time 19.6 (H) 11.6 - 15.2 seconds   INR 1.66 (H) 0.00 - 1.49  APTT     Status: None   Collection Time: 03/03/15 11:00 PM  Result Value Ref Range   aPTT 27 24 - 37 seconds  Triglycerides     Status: None   Collection Time: 03/03/15 11:00 PM  Result Value Ref Range   Triglycerides 60 <150 mg/dL  Glucose, capillary     Status: Abnormal   Collection Time: 03/03/15 11:31 PM  Result Value Ref Range   Glucose-Capillary 134 (H) 65 - 99 mg/dL   Dg Chest 1 View  03/03/2015  CLINICAL DATA:  Acute onset of hypoxia. Cough and shortness of breath. Insert macro EXAM: CHEST 1 VIEW COMPARISON:  Chest radiograph performed 02/27/2015 FINDINGS: The lungs are well-aerated. Patchy right basilar airspace opacity raises concern for pneumonia. The appearance is less typical for asymmetric pulmonary edema. Underlying vascular congestion is noted. Mild left perihilar opacity is seen. No pleural effusion or pneumothorax is identified. The cardiomediastinal silhouette is mildly enlarged. No acute osseous abnormalities are seen. IMPRESSION: 1. Patchy right basilar airspace opacity raises concern for pneumonia. The appearance is less typical for asymmetric pulmonary edema. Mild left perihilar opacity seen. 2. Underlying vascular congestion and mild cardiomegaly. Electronically Signed   By: Garald Balding M.D.   On: 03/03/2015 01:06    Dg Abd 1 View  03/03/2015  CLINICAL DATA:  Status post enteric catheter placement. EXAM: ABDOMEN - 1 VIEW COMPARISON:  None. FINDINGS: Enteric catheter seen with tip overlying gastric body. The bowel gas pattern is normal. No radiographic evidence of organomegaly. No radio-opaque calculi or other significant radiographic abnormality are seen. IMPRESSION: Enteric catheter with tip overlying the gastric body. Electronically Signed   By: Fidela Salisbury M.D.   On: 03/03/2015 19:05   Mr Jeri Cos PX Contrast  03/03/2015  CLINICAL DATA:  82 year old hypertensive female with altered mental status and facial weakness. Subsequent encounter. EXAM: MRI HEAD WITHOUT AND WITH CONTRAST TECHNIQUE: Multiplanar, multiecho pulse sequences of the brain and surrounding structures were obtained without and with intravenous contrast. CONTRAST:  56m MULTIHANCE GADOBENATE DIMEGLUMINE 529 MG/ML IV SOLN COMPARISON:  02/26/2014. 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 Interval slight increase in size of ventricles compared to recent CT consistent with developing hydrocephalus. Patient is un safe for lumbar puncture. Mild small vessel disease changes. No intracranial mass separate from above described findings. Fast technique imaging had to be utilized. Left vertebral artery is at least partially patent. Right vertebral artery, basilar artery and internal carotid arteries are patent. Limited assessment of the left posterior inferior cerebellar artery. 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 Interval slight increase in size of ventricles compared to recent CT consistent with developing hydrocephalus. Left vertebral artery is at least partially patent. Limited assessment of the left posterior inferior cerebellar artery. These results will be  called to the ordering clinician or representative by the Radiologist Assistant, and communication documented in the PACS or zVision Dashboard. Electronically Signed   By: SGenia DelM.D.   On: 03/03/2015 16:55   Portable Chest Xray  03/03/2015  CLINICAL DATA:  Admission chest radiograph. Known hemorrhagic infarct. Initial encounter. EXAM: PORTABLE CHEST 1 VIEW COMPARISON:  Chest radiograph performed earlier today at 6:39  p.m. FINDINGS: The patient's endotracheal tube is seen ending 2-3 cm above the carina. An enteric tube is noted extending below the diaphragm. Bilateral central airspace opacity raises concern for pulmonary edema. No pleural effusion or pneumothorax is seen. The cardiomediastinal silhouette is mildly enlarged. No acute osseous abnormalities are identified. IMPRESSION: 1. Endotracheal tube seen ending 2-3 cm above the carina. 2. Bilateral central airspace opacity is perhaps slightly worsened and raises concern for pulmonary edema. Mild cardiomegaly. Electronically Signed   By: Garald Balding M.D.   On: 03/03/2015 23:00   Dg Chest Port 1 View  03/03/2015  CLINICAL DATA:  82 year old female being transferred to [facility] from [facility] , intubated and sedated now. Cough and shortness of breath. Initial encounter. EXAM: PORTABLE CHEST 1 VIEW COMPARISON:  [place] [facility] 0022 hours today. FINDINGS: Portable AP semi upright view at 1839 hours. Endotracheal tube tip in good position between the level the clavicles and carina. Enteric tube courses to the left upper quadrant, tip not included. Interval worsening confluence of right lung base opacity, favor lower lobe. Upper lobes appear stable. Left lung base is stable and probably unaffected. No superimposed pneumothorax or pulmonary edema. No pleural effusion identified. IMPRESSION: 1. Endotracheal tube tip in good position. Enteric tube courses to the abdomen. 2. Radiographic progression of right lung  base pneumonia and/or aspiration since earlier today. No pleural effusion identified. Electronically Signed   By: Genevie Ann M.D.   On: 03/03/2015 19:04    Assessment: 82 y.o. female status post left subclavian thrombectomy with acute large left cerebellar hemorrhagic infarction, possibly embolic in origin. Patient most likely developed hemorrhagic transformation of an initially ischemic thrombotic or embolic cerebellar stroke. There is mass effect on fourth ventricle but no signs of obstructive hydrocephalus at this point.  Stroke Risk Factors - hypertension  Plan: 1. No changes in current management recommended 2. MRA  of the brain without contrast 3. PT consult, OT consult, Speech consult 4. Echocardiogram 5. Carotid dopplers 6. Prophylactic therapy-None 7. Risk factor modification 8. HgbA1c, fasting lipid panel  We will continue to follow this patient with you.  This patient is critically ill and at significant risk of neurological worsening or death, and care requires constant monitoring of vital signs, hemodynamics,respiratory and cardiac monitoring, neurological assessment, discussion with family, other specialists and medical decision making of high complexity. Total critical care time was 50 minutes.  C.R. Nicole Kindred, MD [facility] [phone]  03/04/2015, 12:06 AM

## 2015-03-04 NOTE — H&P (Signed)
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  SIGNIFICANT EVENTS / STUDIES:  Thrombectomy 1/18 >> MRI 1/21 >> Intubated 1/21 >>  LINES / TUBES: 7.5 mm ETT PIV Urinary catheter.  CULTURES: None  ANTIBIOTICS: Levofloxacin  HISTORY OF PRESENT ILLNESS:   Nancy Hahn is an 82 y/o woman who was admitted to [facility] on 1/18 w/ cold and painful LUE. 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 She was treated with heparin and then transitioned to Elliquis. On the morning of 1/21 she was unable to maintain her balance, slurred speech, and L facial droop. An MRI was was obtained which showed a large cerebral stroke w/ evidence of hemorrhage and concern for developing hydrocephalous. Her function continued to decline and she was intubated. S/p [facility]. She was transferred to [facility] for further evaluation and management.  PAST MEDICAL HISTORY :  Past Medical History  Diagnosis Date  . Breast mass   . Hypertension    Past Surgical History  Procedure Laterality Date  . Breast surgery  1980    biopsy  . Dilation and curettage of uterus    . Foot surgery    . Peripheral vascular catheterization Left 02/28/2015    Procedure: Upper Extremity Angiography;  Surgeon: Renford Dills, MD;  Location: [facility] INVASIVE CV LAB;  Service: Cardiovascular;  Laterality: Left;  . Peripheral vascular catheterization  02/28/2015    Procedure: Upper Extremity Intervention;  Surgeon: Renford Dills, MD;  Location: [facility] INVASIVE CV LAB;  Service: Cardiovascular;;  . Embolectomy Left 02/28/2015    Procedure: EMBOLECTOMY ;  Surgeon: Renford Dills, MD;  Location:  [facility] ORS;  Service: Vascular;  Laterality: Left;   Prior to Admission medications   Medication Sig Start Date End Date Taking? Authorizing Provider  aspirin 81 MG tablet Take 81 mg by mouth daily.    Historical Provider, MD  atenolol (TENORMIN) 50 MG tablet Take 50 mg by mouth daily. 04/07/12   Historical Provider, MD  lisinopril (PRINIVIL,ZESTRIL) 40 MG tablet Take 40 mg by mouth daily. 04/06/12   Historical Provider, MD  NIFEdipine (PROCARDIA XL/ADALAT-CC) 90 MG 24 hr tablet Take 90 mg by mouth daily. 04/06/12   Historical Provider, MD  simvastatin (ZOCOR) 20 MG tablet Take 20 mg by mouth daily. 04/07/12   Historical Provider, MD   No Known Allergies  FAMILY HISTORY:  Family History  Problem Relation Age of Onset  . Emphysema Father   . Cancer Brother     lung  . Cancer Sister     breast  . Cancer Maternal Aunt     ovarian  . Diabetes type II Mother   . Diabetes type II Sister     x3   SOCIAL HISTORY:  reports that she has never smoked. She has never used smokeless tobacco. She reports that she does not drink alcohol or use illicit drugs.  REVIEW OF SYSTEMS:  Unable to assess  SUBJECTIVE:   VITAL SIGNS: Temp:  [97.8 F (36.6 C)-99.1 F (37.3 C)] 97.8 F (36.6 C) (01/21 2353) Pulse Rate:  [54-80] 54 (01/22 0100) Resp:  [14-21] 15 (01/22 0100) BP: (105-171)/(52-71) 105/54 mmHg (01/22 0100) SpO2:  [  94 %-100 %] 100 % (01/22 0100) FiO2 (%):  [40 %-50 %] 40 % (01/22 0100) Weight:  [198 lb 10.2 oz (90.1 kg)-198 lb 11.2 oz (90.13 kg)] 198 lb 10.2 oz (90.1 kg) (01/22 0000) HEMODYNAMICS:   VENTILATOR SETTINGS: Vent Mode:  [-] PRVC FiO2 (%):  [40 %-50 %] 40 % Set Rate:  [15 bmp] 15 bmp Vt Set:  [400 mL-500 mL] 500 mL PEEP:  [5 cmH20] 5 cmH20 Plateau Pressure:  [13 cmH20-18 cmH20] 13 cmH20 INTAKE / OUTPUT: Intake/Output      01/21 0701 - 01/22 0700   I.V. (mL/kg) 15.1 (0.2)   Total Intake(mL/kg) 15.1 (0.2)   Urine (mL/kg/hr) 40   Total Output 40   Net -24.9          PHYSICAL EXAMINATION: General:  Elderly woman in NAD, intubated and sedated Neuro:  Able to awake and open eyes to voice. Follows commands. Pupils 2mm and reactive. Equal. HEENT:  ETT in place, OGT in place Neck: No LAD Cardiovascular:  Dual and normal Lungs:  CTA Abdomen:  Soft Musculoskeletal:  No swollen joints Skin:  No rashes  LABS:  CBC  Recent Labs Lab 03/02/15 0746 03/03/15 0458 03/03/15 2300  WBC 13.0* 15.1* 9.7  HGB 11.0* 11.2* 9.0*  HCT 33.7* 33.9* 27.3*  PLT 184 191 152   Coag's  Recent Labs Lab 02/27/15 1957 03/03/15 2300  APTT 27 27  INR 1.06 1.66*   BMET  Recent Labs Lab 03/02/15 0746 03/03/15 0458 03/03/15 2300  NA 135 140 141  K 3.5 3.4* 3.8  CL 104 102 109  CO2 BUN CREATININE 0.72 0.80 0.85  GLUCOSE 167* 174* 141*   Electrolytes  Recent Labs Lab 03/01/15 0103 03/02/15 0746 03/03/15 0458 03/03/15 2300  CALCIUM 8.1* 8.4* 8.6* 8.2*  MG  --   --   --  1.9  PHOS 3.4  --   --  2.4*   Sepsis Markers No results for input(s): LATICACIDVEN, PROCALCITON, O2SATVEN in the last 168 hours. ABG  Recent Labs Lab 03/03/15 2042  PHART 7.41  PCO2ART 42  PO2ART 73*   Liver Enzymes  Recent Labs Lab 03/01/15 0103 03/03/15 2300  AST  --  21  ALT  --  20  ALKPHOS  --  53  BILITOT  --  0.8  ALBUMIN 3.3* 2.5*   Cardiac Enzymes  Recent Labs Lab 02/27/15 1957  TROPONINI <0.03   Glucose  Recent Labs Lab 03/03/15 2331  GLUCAP 134*    Imaging Dg Chest 1 View  03/03/2015  CLINICAL DATA:  Acute onset of hypoxia. Cough and shortness of breath. Insert macro EXAM: CHEST 1 VIEW COMPARISON:  Chest radiograph performed 02/27/2015 FINDINGS: The lungs are well-aerated. Patchy right basilar airspace opacity raises concern for pneumonia. The appearance is less typical for asymmetric pulmonary edema. Underlying vascular congestion is noted. Mild left perihilar opacity is seen. No pleural effusion or pneumothorax is  identified. The cardiomediastinal silhouette is mildly enlarged. No acute osseous abnormalities are seen. IMPRESSION: 1. Patchy right basilar airspace opacity raises concern for pneumonia. The appearance is less typical for asymmetric pulmonary edema. Mild left perihilar opacity seen. 2. Underlying vascular congestion and mild cardiomegaly. Electronically Signed   By: Roanna Raider M.D.   On: 03/03/2015 01:06   Dg Abd 1 View  03/03/2015  CLINICAL DATA:  Status post enteric catheter placement. EXAM: ABDOMEN - 1 VIEW COMPARISON:  None. FINDINGS: Enteric catheter seen with tip  overlying gastric body. The bowel gas pattern is normal. No radiographic evidence of organomegaly. No radio-opaque calculi or other significant radiographic abnormality are seen. IMPRESSION: Enteric catheter with tip overlying the gastric body. Electronically Signed   By: Ted Mcalpine M.D.   On: 03/03/2015 19:05   Mr Laqueta Jean ZO Contrast  03/03/2015  CLINICAL DATA:  82 year old hypertensive female with altered mental status and facial weakness. Subsequent encounter. EXAM: MRI HEAD WITHOUT AND WITH CONTRAST TECHNIQUE: Multiplanar, multiecho pulse sequences of the brain and surrounding structures were obtained without and with intravenous contrast. CONTRAST:  18mL MULTIHANCE GADOBENATE DIMEGLUMINE 529 MG/ML IV SOLN COMPARISON:  02/26/2014. 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 Interval slight increase in size of ventricles compared to recent CT consistent with developing hydrocephalus. Patient is un safe for lumbar puncture. Mild small vessel disease changes. No intracranial mass separate from above described findings. Fast technique imaging had to be utilized. Left vertebral artery is at least partially patent. Right vertebral artery, basilar artery and internal carotid arteries are patent. Limited assessment of the left posterior  inferior cerebellar artery. 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 Interval slight increase in size of ventricles compared to recent CT consistent with developing hydrocephalus. Left vertebral artery is at least partially patent. Limited assessment of the left posterior inferior cerebellar artery. These results will be called to the ordering clinician or representative by the Radiologist Assistant, and communication documented in the PACS or zVision Dashboard. Electronically Signed   By: Lacy Duverney M.D.   On: 03/03/2015 16:55   Portable Chest Xray  03/03/2015  CLINICAL DATA:  Admission chest radiograph. Known hemorrhagic infarct. Initial encounter. EXAM: PORTABLE CHEST 1 VIEW COMPARISON:  Chest radiograph performed earlier today at 6:39 p.m. FINDINGS: The patient's endotracheal tube is seen ending 2-3 cm above the carina. An enteric tube is noted extending below the diaphragm. Bilateral central airspace opacity raises concern for pulmonary edema. No pleural effusion or pneumothorax is seen. The cardiomediastinal silhouette is mildly enlarged. No acute osseous abnormalities are identified. IMPRESSION: 1. Endotracheal tube seen ending 2-3 cm above the carina. 2. Bilateral central airspace opacity is perhaps slightly worsened and raises concern for pulmonary edema. Mild cardiomegaly. Electronically Signed   By: Roanna Raider M.D.   On: 03/03/2015 23:00   Dg Chest Port 1 View  03/03/2015  CLINICAL DATA:  82 year old female being transferred to [facility] from [facility] , intubated and sedated now. Cough and shortness of breath. Initial encounter. EXAM: PORTABLE CHEST 1 VIEW COMPARISON:  [facility] 0022 hours today. FINDINGS: Portable AP semi upright view at 1839 hours. Endotracheal tube tip in good position between the level the clavicles and carina. Enteric tube  courses to the left upper quadrant, tip not included. Interval worsening confluence of right lung base opacity, favor lower lobe. Upper lobes appear stable. Left lung base is stable and probably unaffected. No superimposed pneumothorax or pulmonary edema. No pleural effusion identified. IMPRESSION: 1. Endotracheal tube tip in good position. Enteric tube courses to the abdomen. 2. Radiographic progression of right lung base pneumonia and/or aspiration since earlier today. No pleural effusion identified. Electronically Signed   By: Odessa Fleming M.D.   On: 03/03/2015 19:04    EKG: Sinus bradycardia (on propofol) CXR: Consolidative process in R mid /lower lung zone, possible PNA.and/or volume  ASSESSMENT / PLAN:  Active Problems:   Cerebral hemorrhage (HCC)   PULMONARY A: Pulmonary consolidation Pulmonary Edema Hypoxic respiratory failure Need for mechanical ventilation P:   Will maintain on Vent (PRVC) for now Daily SBTs Consider diuresis once more stable Possible PNA, although evidence for infection is modest. Will continue empiric treatment.  CARDIOVASCULAR A: Arterial Thromboembolism P:   Unclear etiology, favor paroxysmal AF. Hold AC for now.  RENAL A: No acute issues P:    GASTROINTESTINAL A: Hypoalbuminemia Need for enteral nutrition P:   C/s RD for TFs Famotidine for PPx  HEMATOLOGIC A: Arterial thrombus, CNS bleed P:   Hold AC for now, will discuss w/ NSGY/Neurology when appropriate to restart AC.  INFECTIOUS A: Possible PNA P:   Continue Levofloxacin  ENDOCRINE A: Hyperglycemia P:   SSI  NEUROLOGIC A: Acute stroke, ischemic w/ hemorrhagic conversion Need for sedation P:   C/s w/ NSGY + neurology Propofol for sedation  BEST PRACTICE / DISPOSITION Level of Care:  ICU Primary Service:  PCCM Consultants:  NSGY/Neurology Code Status:  Full Diet:  Per TF DVT Px:  Contraindicated (CNS Bleed) GI Px:  Famotidine Skin Integrity:  Intact Social /  Family:  Will be arriving at [facility] in AM.  TODAY'S SUMMARY: 82 y/o woman with arterial thromboembolism   I have personally obtained a history, examined the patient, evaluated laboratory and imaging results, formulated the assessment and plan and placed orders.  CRITICAL CARE: The patient is critically ill with multiple organ systems failure and requires high complexity decision making for assessment and support, frequent evaluation and titration of therapies, application of advanced monitoring technologies and extensive interpretation of multiple databases. Critical Care Time devoted to patient care services described in this note is 60 minutes.   Jamie Kato, MD Pulmonary and Critical Care Medicine [facility] Pager: [phone]   03/04/2015, 1:23 AM

## 2015-03-04 NOTE — Progress Notes (Signed)
Increased FiO2 to 0.50, and PEEP to 10 cmH2O due to desaturation to 84%, SATS  improved to 98% after vent change

## 2015-03-04 NOTE — Progress Notes (Signed)
RT note: Pt. Transported uneventfully to/from CT. 

## 2015-03-04 NOTE — Progress Notes (Signed)
Patient ID: Nancy Hahn, female   DOB: Oct 13, 1931, 82 y.o.   MRN: [id number] Subjective: Patient remains intubated.  Objective: Vital signs in last 24 hours: Temp:  [97.8 F (36.6 C)-98.7 F (37.1 C)] 98.7 F (37.1 C) (01/22 0800) Pulse Rate:  [54-80] 70 (01/22 0800) Resp:  [13-21] 16 (01/22 0800) BP: (105-171)/(52-71) 143/64 mmHg (01/22 0800) SpO2:  [93 %-100 %] 93 % (01/22 0800) FiO2 (%):  [40 %-50 %] 50 % (01/22 0807) Weight:  [90.1 kg (198 lb 10.2 oz)] 90.1 kg (198 lb 10.2 oz) (01/22 0500)  Intake/Output from previous day: 01/21 0701 - 01/22 0700 In: 55.6 [I.V.:55.6] Out: 500 [Urine:200; Emesis/NG output:300] Intake/Output this shift: Total I/O In: 16.2 [I.V.:16.2] Out: 60 [Urine:60]  She opens her eyes to voice in regards the examiner, she moves all extremities equally, she shakes her head she has no headache  Lab Results: Lab Results  Component Value Date   WBC 9.7 03/03/2015   HGB 9.0* 03/03/2015   HCT 27.3* 03/03/2015   MCV 91.3 03/03/2015   PLT 152 03/03/2015   Lab Results  Component Value Date   INR 1.66* 03/03/2015   BMET Lab Results  Component Value Date   NA 141 03/03/2015   K 3.8 03/03/2015   CL 109 03/03/2015   CO2 23 03/03/2015   GLUCOSE 141* 03/03/2015   BUN 20 03/03/2015   CREATININE 0.85 03/03/2015   CALCIUM 8.2* 03/03/2015    Studies/Results: Dg Chest 1 View  03/03/2015  CLINICAL DATA:  Acute onset of hypoxia. Cough and shortness of breath. Insert macro EXAM: CHEST 1 VIEW COMPARISON:  Chest radiograph performed 02/27/2015 FINDINGS: The lungs are well-aerated. Patchy right basilar airspace opacity raises concern for pneumonia. The appearance is less typical for asymmetric pulmonary edema. Underlying vascular congestion is noted. Mild left perihilar opacity is seen. No pleural effusion or pneumothorax is identified. The cardiomediastinal silhouette is mildly enlarged. No acute osseous abnormalities are seen. IMPRESSION: 1. Patchy right basilar  airspace opacity raises concern for pneumonia. The appearance is less typical for asymmetric pulmonary edema. Mild left perihilar opacity seen. 2. Underlying vascular congestion and mild cardiomegaly. Electronically Signed   By: Roanna Raider M.D.   On: 03/03/2015 01:06   Dg Abd 1 View  03/03/2015  CLINICAL DATA:  Status post enteric catheter placement. EXAM: ABDOMEN - 1 VIEW COMPARISON:  None. FINDINGS: Enteric catheter seen with tip overlying gastric body. The bowel gas pattern is normal. No radiographic evidence of organomegaly. No radio-opaque calculi or other significant radiographic abnormality are seen. IMPRESSION: Enteric catheter with tip overlying the gastric body. Electronically Signed   By: Ted Mcalpine M.D.   On: 03/03/2015 19:05   Ct Head Wo Contrast  03/04/2015  CLINICAL DATA:  Known left cerebellar infarct, evaluate for hydrocephalus EXAM: CT HEAD WITHOUT CONTRAST TECHNIQUE: Contiguous axial images were obtained from the base of the skull through the vertex without intravenous contrast. COMPARISON:  03/03/2015 FINDINGS: Bony calvarium is intact. There are again noted changes consistent with a left cerebellar infarct with mass-effect upon the fourth ventricle and pons. The lateral ventricles arm more prominent than that seen on a prior exam from 02/26/2014 but relatively stable from the recent MRI examination from the previous day. No acute hemorrhage is identified. No other focal abnormality is seen. IMPRESSION: Left cerebellar infarct with evidence of mild ventricular dilatation stable from the previous day. Electronically Signed   By: Alcide Clever M.D.   On: 03/04/2015 09:07   Mr Laqueta Jean  Wo Contrast  03/03/2015  CLINICAL DATA:  82 year old hypertensive female with altered mental status and facial weakness. Subsequent encounter. EXAM: MRI HEAD WITHOUT AND WITH CONTRAST TECHNIQUE: Multiplanar, multiecho pulse sequences of the brain and surrounding structures were obtained without and  with intravenous contrast. CONTRAST:  18mL MULTIHANCE GADOBENATE DIMEGLUMINE 529 MG/ML IV SOLN COMPARISON:  02/26/2014. 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 Interval slight increase in size of ventricles compared to recent CT consistent with developing hydrocephalus. Patient is un safe for lumbar puncture. Mild small vessel disease changes. No intracranial mass separate from above described findings. Fast technique imaging had to be utilized. Left vertebral artery is at least partially patent. Right vertebral artery, basilar artery and internal carotid arteries are patent. Limited assessment of the left posterior inferior cerebellar artery. 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 Interval slight increase in size of ventricles compared to recent CT consistent with developing hydrocephalus. Left vertebral artery is at least partially patent. Limited assessment of the left posterior inferior cerebellar artery. These results will be called to the ordering clinician or representative by the Radiologist Assistant, and communication documented in the PACS or zVision Dashboard. Electronically Signed   By: Lacy Duverney M.D.   On: 03/03/2015 16:55   Portable Chest Xray  03/03/2015  CLINICAL DATA:  Admission chest radiograph. Known hemorrhagic infarct. Initial encounter. EXAM: PORTABLE CHEST 1 VIEW COMPARISON:  Chest radiograph performed earlier today at 6:39 p.m. FINDINGS: The patient's endotracheal tube is seen ending 2-3 cm above the carina. An enteric tube is noted extending below the diaphragm. Bilateral central airspace opacity raises concern for pulmonary edema. No pleural effusion or pneumothorax is seen. The cardiomediastinal silhouette is mildly enlarged. No acute osseous abnormalities are identified. IMPRESSION: 1.  Endotracheal tube seen ending 2-3 cm above the carina. 2. Bilateral central airspace opacity is perhaps slightly worsened and raises concern for pulmonary edema. Mild cardiomegaly. Electronically Signed   By: Roanna Raider M.D.   On: 03/03/2015 23:00   Dg Chest Port 1 View  03/03/2015  CLINICAL DATA:  82 year old female being transferred to [facility] from [facility] , intubated and sedated now. Cough and shortness of breath. Initial encounter. EXAM: PORTABLE CHEST 1 VIEW COMPARISON:  [facility] 0022 hours today. FINDINGS: Portable AP semi upright view at 1839 hours. Endotracheal tube tip in good position between the level the clavicles and carina. Enteric tube courses to the left upper quadrant, tip not included. Interval worsening confluence of right lung base opacity, favor lower lobe. Upper lobes appear stable. Left lung base is stable and probably unaffected. No superimposed pneumothorax or pulmonary edema. No pleural effusion identified. IMPRESSION: 1. Endotracheal tube tip in good position. Enteric tube courses to the abdomen. 2. Radiographic progression of right lung base pneumonia and/or aspiration since earlier today. No pleural effusion identified. Electronically Signed   By: Odessa Fleming M.D.   On: 03/03/2015 19:04    Assessment/Plan: CT scan shows what looks like a bland left cerebellar infarct without evidence of hydrocephalus today. Neurologically she is stable. I do not believe she is going to need surgical intervention. Neurology workup continues. If she develops hydrocephalus she would be a candidate for ventriculostomy placement. I will sign off for now but please call me if I can be of further assistance.   LOS: 1 day    Jasma Seevers  S 03/04/2015, 9:18 AM

## 2015-03-04 NOTE — H&P (Signed)
PULMONARY / CRITICAL CARE MEDICINE HISTORY AND PHYSICAL EXAMINATION   Name: Nancy Hahn MRN: [id number] DOB: 01/19/1932    ADMISSION DATE:  03/03/2015  PRIMARY SERVICE: PCCM  CHIEF COMPLAINT:  Hemorrhagic infarction  BRIEF PATIENT DESCRIPTION:   82 y/o woman who was admitted to [facility] on 1/18 w/ cold and painful LUE. 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 She was treated with heparin and then transitioned to Elliquis. On the morning of 1/21 she was unable to maintain her balance, slurred speech, and L facial droop. An MRI was was obtained which showed a large cerebral stroke w/ evidence of hemorrhage and concern for developing hydrocephalous. Her function continued to decline and she was intubated. S/p [facility]. She was transferred to [facility] [place] for further evaluation and management.  SIGNIFICANT EVENTS / STUDIES:  Thrombectomy 1/18 >> MRI 1/21 >> Intubated 1/21 >>  LINES / TUBES: 7.5 mm ETT PIV Urinary catheter.  CULTURES: Tracheal aspirate 03/04/15 >  ANTIBIOTICS: Levofloxacin 03/03/2015 > 03/04/15 Vanc 03/04/15 >> Zosyn 03/04/15>>  SUBJECTIVE:  03/04/15 - on vent. Folliwng comands on low dose diprivan. Moving all 4s. RT reports desaturatino and needing 50% fio2/peep 10. Not on pressors   VITAL SIGNS: Temp:  [97.8 F (36.6 C)-99.1 F (37.3 C)] 98.3 F (36.8 C) (01/22 0400) Pulse Rate:  [54-80] 60 (01/22 0700) Resp:  [13-21] 15 (01/22 0700) BP: (105-171)/(52-71) 115/55 mmHg (01/22 0700) SpO2:  [94 %-100 %] 98 % (01/22 0700) FiO2 (%):  [40 %-50 %] 40 % (01/22 0600) Weight:  [90.1 kg (198 lb 10.2 oz)] 90.1 kg (198 lb 10.2 oz) (01/22 0500) HEMODYNAMICS:   VENTILATOR SETTINGS: Vent Mode:  [-] PRVC FiO2 (%):  [40 %-50 %] 40 % Set Rate:  [15 bmp] 15 bmp Vt Set:  [400 mL-500 mL] 500 mL PEEP:  [5 cmH20] 5 cmH20 Plateau Pressure:  [13 cmH20-18 cmH20] 18 cmH20 INTAKE / OUTPUT: Intake/Output      01/21 0701 - 01/22 0700 01/22 0701 -  01/23 0700   I.V. (mL/kg) 55.6 (0.6) 16.2 (0.2)   Total Intake(mL/kg) 55.6 (0.6) 16.2 (0.2)   Urine (mL/kg/hr) 200 60 (0.6)   Emesis/NG output 300    Total Output 500 60   Net -444.4 -43.8          PHYSICAL EXAMINATION: General:  Elderly woman in NAD, intubated and sedated Neuro:  Able to awake and open eyes to voice. Follows commands. Pupils 2mm and reactive. Equal. HEENT:  ETT in place, OGT in place Neck: No LAD Cardiovascular:  Dual and normal Lungs:  CTA Abdomen:  Soft Musculoskeletal:  No swollen joints Skin:  No rashes  LABS:   PULMONARY  Recent Labs Lab 03/03/15 2042  PHART 7.41  PCO2ART 42  PO2ART 73*  HCO3 26.6  O2SAT 94.6    CBC  Recent Labs Lab 03/02/15 0746 03/03/15 0458 03/03/15 2300  HGB 11.0* 11.2* 9.0*  HCT 33.7* 33.9* 27.3*  WBC 13.0* 15.1* 9.7  PLT 184 191 152    COAGULATION  Recent Labs Lab 02/27/15 1957 03/03/15 2300  INR 1.06 1.66*    CARDIAC   Recent Labs Lab 02/27/15 1957  TROPONINI <0.03   No results for input(s): PROBNP in the last 168 hours.   CHEMISTRY  Recent Labs Lab 02/27/15 1957 03/01/15 0103 03/02/15 0746 03/03/15 0458 03/03/15 2300  NA 144 139 135 140 141  K 4.7 3.8 3.5 3.4* 3.8  CL 103 111 104 102 109  CO2  GLUCOSE 116* 144* 167* 174* 141*  BUN 28* CREATININE 1.27* 1.23* 0.72 0.80 0.85  CALCIUM 10.4* 8.1* 8.4* 8.6* 8.2*  MG  --   --   --   --  1.9  PHOS  --  3.4  --   --  2.4*   Estimated Creatinine Clearance: 61 mL/min (by C-G formula based on Cr of 0.85).   LIVER  Recent Labs Lab 02/27/15 1957 03/01/15 0103 03/03/15 2300  AST  --   --  21  ALT  --   --  20  ALKPHOS  --   --  53  BILITOT  --   --  0.8  PROT  --   --  5.5*  ALBUMIN  --  3.3* 2.5*  INR 1.06  --  1.66*     INFECTIOUS No results for input(s): LATICACIDVEN, PROCALCITON in the last 168 hours.   ENDOCRINE CBG (last 3)   Recent Labs  03/03/15 2331 03/04/15 0325  GLUCAP 134* 101*          IMAGING x48h  - image(s) personally visualized  -   highlighted in bold Dg Chest 1 View  03/03/2015  CLINICAL DATA:  Acute onset of hypoxia. Cough and shortness of breath. Insert macro EXAM: CHEST 1 VIEW COMPARISON:  Chest radiograph performed 02/27/2015 FINDINGS: The lungs are well-aerated. Patchy right basilar airspace opacity raises concern for pneumonia. The appearance is less typical for asymmetric pulmonary edema. Underlying vascular congestion is noted. Mild left perihilar opacity is seen. No pleural effusion or pneumothorax is identified. The cardiomediastinal silhouette is mildly enlarged. No acute osseous abnormalities are seen. IMPRESSION: 1. Patchy right basilar airspace opacity raises concern for pneumonia. The appearance is less typical for asymmetric pulmonary edema. Mild left perihilar opacity seen. 2. Underlying vascular congestion and mild cardiomegaly. Electronically Signed   By: Roanna Raider M.D.   On: 03/03/2015 01:06   Dg Abd 1 View  03/03/2015  CLINICAL DATA:  Status post enteric catheter placement. EXAM: ABDOMEN - 1 VIEW COMPARISON:  None. FINDINGS: Enteric catheter seen with tip overlying gastric body. The bowel gas pattern is normal. No radiographic evidence of organomegaly. No radio-opaque calculi or other significant radiographic abnormality are seen. IMPRESSION: Enteric catheter with tip overlying the gastric body. Electronically Signed   By: Ted Mcalpine M.D.   On: 03/03/2015 19:05   Mr Laqueta Jean ZO Contrast  03/03/2015  CLINICAL DATA:  82 year old hypertensive female with altered mental status and facial weakness. Subsequent encounter. EXAM: MRI HEAD WITHOUT AND WITH CONTRAST TECHNIQUE: Multiplanar, multiecho pulse sequences of the brain and surrounding structures were obtained without and with intravenous contrast. CONTRAST:  18mL MULTIHANCE GADOBENATE DIMEGLUMINE 529 MG/ML IV SOLN COMPARISON:  02/26/2014. FINDINGS: Large acute hemorrhagic left mid to  inferior cerebellar infarct with associated mass effect compressing the fourth ventricle. Interval slight increase in size of ventricles compared to recent CT consistent with developing hydrocephalus. Patient is un safe for lumbar puncture. Mild small vessel disease changes. No intracranial mass separate from above described findings. Fast technique imaging had to be utilized. Left vertebral artery is at least partially patent. Right vertebral artery, basilar artery and internal carotid arteries are patent. Limited assessment of the left posterior inferior cerebellar artery. 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 Interval slight increase in size of ventricles compared to recent CT consistent with developing hydrocephalus. Left  vertebral artery is at least partially patent. Limited assessment of the left posterior inferior cerebellar artery. These results will be called to the ordering clinician or representative by the Radiologist Assistant, and communication documented in the PACS or zVision Dashboard. Electronically Signed   By: Lacy Duverney M.D.   On: 03/03/2015 16:55   Portable Chest Xray  03/03/2015  CLINICAL DATA:  Admission chest radiograph. Known hemorrhagic infarct. Initial encounter. EXAM: PORTABLE CHEST 1 VIEW COMPARISON:  Chest radiograph performed earlier today at 6:39 p.m. FINDINGS: The patient's endotracheal tube is seen ending 2-3 cm above the carina. An enteric tube is noted extending below the diaphragm. Bilateral central airspace opacity raises concern for pulmonary edema. No pleural effusion or pneumothorax is seen. The cardiomediastinal silhouette is mildly enlarged. No acute osseous abnormalities are identified. IMPRESSION: 1. Endotracheal tube seen ending 2-3 cm above the carina. 2. Bilateral central airspace opacity is perhaps slightly worsened and raises concern for pulmonary edema. Mild  cardiomegaly. Electronically Signed   By: Roanna Raider M.D.   On: 03/03/2015 23:00   Dg Chest Port 1 View  03/03/2015  CLINICAL DATA:  82 year old female being transferred to [facility] from [facility] , intubated and sedated now. Cough and shortness of breath. Initial encounter. EXAM: PORTABLE CHEST 1 VIEW COMPARISON:  [facility] 0022 hours today. FINDINGS: Portable AP semi upright view at 1839 hours. Endotracheal tube tip in good position between the level the clavicles and carina. Enteric tube courses to the left upper quadrant, tip not included. Interval worsening confluence of right lung base opacity, favor lower lobe. Upper lobes appear stable. Left lung base is stable and probably unaffected. No superimposed pneumothorax or pulmonary edema. No pleural effusion identified. IMPRESSION: 1. Endotracheal tube tip in good position. Enteric tube courses to the abdomen. 2. Radiographic progression of right lung base pneumonia and/or aspiration since earlier today. No pleural effusion identified. Electronically Signed   By: Odessa Fleming M.D.   On: 03/03/2015 19:04       ASSESSMENT / PLAN:  Active Problems:   Cerebral hemorrhage (HCC)   PULMONARY A: Pulmonary consolidation Pulmonary Edema Aucte Hypoxic respiratory failure Need for mechanical ventilation Likekly HCAP   - needing 50% fio2, peep 10 and preventing SBT  P:   Will maintain on Vent (PRVC) for now Daily SBTs HCAP Abx for now Consider diuresis once more stable   CARDIOVASCULAR A: Arterial Thromboembolism P:   Unclear etiology, favor paroxysmal AF. Hold AC for now.  RENAL A: No acute issues other than mild hypoamg P:   repelte mag  GASTROINTESTINAL A: Hypoalbuminemia Need for enteral nutrition P:   C/s RD for TFs Famotidine for PPx  HEMATOLOGIC A: Arterial thrombus, CNS bleed P:   Hold AC for now, will discuss w/ NSGY/Neurology when appropriate to restart  AC.  INFECTIOUS A: Possible PNA - HCAP P:   levaquin 03/03/2015  >>1//22/17 Vanc 03/04/15 >> Zosun 03/04/15 >  ENDOCRINE A: Hyperglycemia P:   SSI  NEUROLOGIC A: Acute stroke, ischemic w/ hemorrhagic conversion Need for sedation   - following commands  P:   C/s w/ NSGY + neurology Propofol for sedation  BEST PRACTICE / DISPOSITION Level of Care:  ICU Primary Service:  PCCM Consultants:  NSGY/Neurology Code Status:  Full Diet:  Per TF DVT Px:  Contraindicated (CNS Bleed) GI Px:  Famotidine Skin Integrity:  Intact Social / Family:  Will be arriving at [facility] in AM 03/04/15 but not at bedside currently  TODAY'S SUMMARY: 82 y/o woman with arterial thromboembolism       The patient is critically ill with multiple organ systems failure and requires high complexity decision making for assessment and support, frequent evaluation and titration of therapies, application of advanced monitoring technologies and extensive interpretation of multiple databases.   Critical Care Time devoted to patient care services described in this note is  30  Minutes. This time reflects time of care of this signee Dr Kalman Shan. This critical care time does not reflect procedure time, or teaching time or supervisory time of PA/NP/Med student/Med Resident etc but could involve care discussion time    Dr. Kalman Shan, M.D., [facility].C.P Pulmonary and Critical Care Medicine Staff Physician  System [place] [facility] Pager: [phone], If no answer or between  15:00h - 7:00h: call [phone]  03/04/2015 8:05 AM

## 2015-03-05 ENCOUNTER — Inpatient Hospital Stay (HOSPITAL_COMMUNITY): Payer: Commercial Managed Care - HMO

## 2015-03-05 ENCOUNTER — Ambulatory Visit (HOSPITAL_COMMUNITY): Payer: Commercial Managed Care - HMO

## 2015-03-05 DIAGNOSIS — Z789 Other specified health status: Secondary | ICD-10-CM

## 2015-03-05 DIAGNOSIS — R609 Edema, unspecified: Secondary | ICD-10-CM

## 2015-03-05 DIAGNOSIS — Z978 Presence of other specified devices: Secondary | ICD-10-CM | POA: Insufficient documentation

## 2015-03-05 DIAGNOSIS — G936 Cerebral edema: Secondary | ICD-10-CM

## 2015-03-05 LAB — CBC WITH DIFFERENTIAL/PLATELET
BASOS ABS: 0 10*3/uL (ref 0.0–0.1)
Basophils Relative: 0 %
EOS ABS: 0.3 10*3/uL (ref 0.0–0.7)
EOS PCT: 4 %
HCT: 28.3 % — ABNORMAL LOW (ref 36.0–46.0)
Hemoglobin: 9.9 g/dL — ABNORMAL LOW (ref 12.0–15.0)
LYMPHS PCT: 13 %
Lymphs Abs: 1 10*3/uL (ref 0.7–4.0)
MCH: 31.2 pg (ref 26.0–34.0)
MCHC: 35 g/dL (ref 30.0–36.0)
MCV: 89.3 fL (ref 78.0–100.0)
MONO ABS: 0.3 10*3/uL (ref 0.1–1.0)
Monocytes Relative: 5 %
Neutro Abs: 5.6 10*3/uL (ref 1.7–7.7)
Neutrophils Relative %: 78 %
PLATELETS: 138 10*3/uL — AB (ref 150–400)
RBC: 3.17 MIL/uL — ABNORMAL LOW (ref 3.87–5.11)
RDW: 13.3 % (ref 11.5–15.5)
WBC: 7.2 10*3/uL (ref 4.0–10.5)

## 2015-03-05 LAB — BLOOD GAS, ARTERIAL
ACID-BASE EXCESS: 1.7 mmol/L (ref 0.0–3.0)
BICARBONATE: 26.6 meq/L (ref 21.0–28.0)
FIO2: 0.5
MECHVT: 400 mL
Mechanical Rate: 15
O2 Saturation: 94.6 %
PEEP/CPAP: 5 cmH2O
PH ART: 7.41 (ref 7.350–7.450)
Patient temperature: 37
pCO2 arterial: 42 mmHg (ref 32.0–48.0)
pO2, Arterial: 73 mmHg — ABNORMAL LOW (ref 83.0–108.0)

## 2015-03-05 LAB — LIPID PANEL
Cholesterol: 108 mg/dL (ref 0–200)
HDL: 22 mg/dL — AB (ref >40)
LDL Cholesterol: 56 mg/dL (ref 0–99)
Total CHOL/HDL Ratio: 4.9 RATIO
Triglycerides: 149 mg/dL (ref <150)
VLDL: 30 mg/dL (ref 0–40)

## 2015-03-05 LAB — BASIC METABOLIC PANEL
ANION GAP: 11 (ref 5–15)
ANION GAP: 12 (ref 5–15)
BUN: 24 mg/dL — AB (ref 6–20)
BUN: 22 mg/dL — ABNORMAL HIGH (ref 6–20)
CALCIUM: 8.2 mg/dL — AB (ref 8.9–10.3)
CALCIUM: 8 mg/dL — AB (ref 8.9–10.3)
CO2: 21 mmol/L — ABNORMAL LOW (ref 22–32)
CO2: 25 mmol/L (ref 22–32)
Chloride: 108 mmol/L (ref 101–111)
Chloride: 106 mmol/L (ref 101–111)
Creatinine, Ser: 0.84 mg/dL (ref 0.44–1.00)
Creatinine, Ser: 0.83 mg/dL (ref 0.44–1.00)
GFR calc Af Amer: >60 mL/min (ref >60)
GLUCOSE: 119 mg/dL — AB (ref 65–99)
Glucose, Bld: 160 mg/dL — ABNORMAL HIGH (ref 65–99)
POTASSIUM: 3.9 mmol/L (ref 3.5–5.1)
Potassium: 5.4 mmol/L — ABNORMAL HIGH (ref 3.5–5.1)
Sodium: 141 mmol/L (ref 135–145)
Sodium: 142 mmol/L (ref 135–145)

## 2015-03-05 LAB — GLUCOSE, CAPILLARY
GLUCOSE-CAPILLARY: 121 mg/dL — AB (ref 65–99)
GLUCOSE-CAPILLARY: 153 mg/dL — AB (ref 65–99)
GLUCOSE-CAPILLARY: 140 mg/dL — AB (ref 65–99)
Glucose-Capillary: 92 mg/dL (ref 65–99)
Glucose-Capillary: 147 mg/dL — ABNORMAL HIGH (ref 65–99)

## 2015-03-05 LAB — PHOSPHORUS: Phosphorus: 2 mg/dL — ABNORMAL LOW (ref 2.5–4.6)

## 2015-03-05 LAB — MAGNESIUM: Magnesium: 2.5 mg/dL — ABNORMAL HIGH (ref 1.7–2.4)

## 2015-03-05 LAB — PROCALCITONIN: Procalcitonin: 0.57 ng/mL

## 2015-03-05 LAB — LACTIC ACID, PLASMA: LACTIC ACID, VENOUS: 1.1 mmol/L (ref 0.5–2.0)

## 2015-03-05 MED ORDER — PRO-STAT SUGAR FREE PO LIQD
30.0000 mL | Freq: Three times a day (TID) | ORAL | Status: DC
  Administered 2015-03-05 – 2015-03-06 (×3): 30 mL
  Filled 2015-03-05 (×3): qty 30

## 2015-03-05 MED ORDER — SODIUM PHOSPHATE 3 MMOLE/ML IV SOLN
10.0000 mmol | Freq: Once | INTRAVENOUS | Status: AC
  Administered 2015-03-05: 10 mmol via INTRAVENOUS
  Filled 2015-03-05: qty 3.33

## 2015-03-05 MED ORDER — VITAL HIGH PROTEIN PO LIQD
1000.0000 mL | ORAL | Status: DC
  Administered 2015-03-05 (×2): 1000 mL

## 2015-03-05 MED FILL — Fentanyl Citrate Preservative Free (PF) Inj 100 MCG/2ML: INTRAMUSCULAR | Qty: 2 | Status: AC

## 2015-03-05 NOTE — Progress Notes (Signed)
STROKE TEAM PROGRESS NOTE   HISTORY OF PRESENT ILLNESS Nancy Hahn is an 82 y.o. female with a history of a breast mass and hypertension who was admitted to [facility] on 02/26/2014 for acute left subclavian artery thrombosis. She underwent thrombectomy on 02/28/2015. Patient was on IV heparin initially and was changed to Eliquis. She became febrile overnight with a throat and coughing with chest x-ray findings consistent with aspiration pneumonia. She also developed a change in mental status and an MRI which is a large acute hemorrhagic left cerebellar infarction mass effect on fourth ventricle. A shunt had not developed hydrocephalus, however. She was intubated on mechanical ventilation and subsequently transferred to [facility] for further management. Reversal of anticoagulation with Eloquis was gone.  LSN: Unclear tPA Given: No: Acute hemorrhagic cerebral infarction in the setting of anticoagulation with Eliquis. mRankin:   SUBJECTIVE (INTERVAL HISTORY) She is neurologically stable.   family is at bedside. She remains intubated but is awake and following commands well.   OBJECTIVE Temp:  [97.4 F (36.3 C)-98.9 F (37.2 C)] 98.1 F (36.7 C) (01/23 1200) Pulse Rate:  [58-71] 71 (01/23 1200) Cardiac Rhythm:  [-] Normal sinus rhythm (01/23 0800) Resp:  [11-21] 14 (01/23 1200) BP: (114-153)/(53-67) 147/61 mmHg (01/23 1200) SpO2:  [96 %-100 %] 100 % (01/23 1200) FiO2 (%):  [40 %-50 %] 40 % (01/23 1200) Weight:  [203 lb 7.8 oz (92.3 kg)] 203 lb 7.8 oz (92.3 kg) (01/23 0433)  CBC:   Recent Labs Lab 03/03/15 2300 03/05/15 0055  WBC 9.7 7.2  NEUTROABS 8.5* 5.6  HGB 9.0* 9.9*  HCT 27.3* 28.3*  MCV 91.3 89.3  PLT 152 138*    Basic Metabolic Panel:   Recent Labs Lab 03/03/15 2300 03/05/15 0055  NA 141 141  K 3.8 5.4*  CL 109 108  CO2 23 21*  GLUCOSE 141* 119*  BUN 20 24*  CREATININE 0.85 0.84  CALCIUM 8.2* 8.0*  MG 1.9 2.5*  PHOS 2.4* 2.0*    Lipid  Panel:     Component Value Date/Time   CHOL 108 03/05/2015 0735   TRIG 149 03/05/2015 0735   HDL 22* 03/05/2015 0735   CHOLHDL 4.9 03/05/2015 0735   VLDL 30 03/05/2015 0735   LDLCALC 56 03/05/2015 0735   HgbA1c:  Lab Results  Component Value Date   HGBA1C 6.6* 02/27/2015   Urine Drug Screen: No results found for: LABOPIA, COCAINSCRNUR, LABBENZ, AMPHETMU, THCU, LABBARB    IMAGING  Dg Chest 1 View  03/03/2015   1. Patchy right basilar airspace opacity raises concern for pneumonia. The appearance is less typical for asymmetric pulmonary edema. Mild left perihilar opacity seen.  2. Underlying vascular congestion and mild cardiomegaly.    Dg Abd 1 View 03/03/2015   Enteric catheter with tip overlying the gastric body.      Mr Laqueta Jean Wo Contrast 03/03/2015   Large acute hemorrhagic left mid to inferior cerebellar infarct with associated mass effect compressing the fourth ventricle. Interval slight increase in size of ventricles compared to recent CT consistent with developing hydrocephalus. Left vertebral artery is at least partially patent. Limited assessment of the left posterior inferior cerebellar artery.      Portable Chest Xray 03/03/2015   1. Endotracheal tube seen ending 2-3 cm above the carina.  2. Bilateral central airspace opacity is perhaps slightly worsened and raises concern for pulmonary edema. Mild cardiomegaly.   Dg Chest Port 1 View 03/03/2015   1. Endotracheal tube tip  in good position. Enteric tube courses to the abdomen.  2. Radiographic progression of right lung base pneumonia and/or aspiration since earlier today. No pleural effusion identified.   PHYSICAL EXAM Frail elderly lady who is intubated . Afebrile. Head is nontraumatic. Neck is supple without bruit. Cardiac exam no murmur or gallop. Lungs are clear to auscultation. Distal pulses are well felt. Neurological Exam : Intubated, Awake and alert. PERRL. Face symmetric. Opens eyes to light  stimulation.   Can follow simple commands, nods to questions. No gaze deviation. Does track. Conjugate gaze.No nystagmus. Dysmetric saccades to left gaze. Spontaneous extremity movements. Withdraws in all extremities equally. Tongue midline. Cough and gag intact. Toes equivocal.  Gait deferred Gait: Unable to test      ASSESSMENT/PLAN Nancy Hahn is a 82 y.o. female with history of breast mass, hypertension, recent aspiration pneumonia, left subclavian artery thrombosis s/p   thrombectomy on 02/28/2015 and subsequent Eliquis therapy, presenting with altered mental status. She did not receive IV t-PA due to hemorrhage.   Hemorrhagic Stroke: left cerebellar etiology likely LVA embolism from subclavian thrombosis  Resultant  No focal motor deficits but unable to assess balance and walking due to intubation  MRI  - Large acute hemorrhagic left mid to inferior cerebellar infarct with associated mass effect.  MRA - not performed  Carotid Doppler - not indicated  2D Echo - EF 55-60%. No cardiac source of emboli identified.  LDL - not performed  HgbA1c not performed  VTE prophylaxis - SCDs  Diet NPO time specified  aspirin 81 mg daily prior to admission, now on No antithrombotic secondary to hemorrhage.  Ongoing aggressive stroke risk factor management  Therapy recommendations: Pending  Disposition:  Pending  Hypertension  Blood pressure tends to run low   Other Stroke Risk Factors  Advanced age  Obesity, Body mass index is 30.95 kg/(m^2).    Other Active Problems  Anemia - monitor  Pneumonia - CCM following - on Zosyn and vancomycin - Day #2    Hospital day # 1   I personally examined patient and images, and have participated in and made any corrections needed to history, physical, neuro exam,assessment and plan as stated above.  I have personally obtained the history, evaluated labs, reviewed imaging studies and agree with radiology interpretations. Patient was  admitted to [facility] for acute subclavian artery thrombosis and thrombectomy, she was on IV heparin initially and was changed to Eliquis. She had a change in MS found to have hemorrhagic left cerebellar infarction with mass effect on fourth ventricle.  She was intubated on mechanical ventilation and subsequently transferred to [facility] for further management. Repeat CT head   was stable and she is neurologically stable. NSY evaluated and does not recommend intervention at this time. If she develops hydrocephalus she would be a candidate for ventriculostomy placement.  Recommend CT angiogram of the neck and brain tomorrow morning. We will not start hypertonic saline despite mass effect on fourth ventricle as patient's clinical exam looks quite good and it has been more than 5 days since her stroke and edema should be on the decline. Discussion with patient and family at the bedside and answered questions  This patient is critically ill and at significant risk of neurological worsening, death and care requires constant monitoring of vital signs, hemodynamics,respiratory and cardiac monitoring,review of multiple databases, neurological assessment, discussion with family, other specialists and medical decision making of high complexity.I  I spent 40 minutes of neurocritical care time in the  care of this patient.  Delia Heady, MD [facility] Pager: [phone] 05/05/2014 5:09 PM     To contact Stroke Continuity provider, please refer to [email or web address]. After hours, contact General Neurology

## 2015-03-05 NOTE — Progress Notes (Addendum)
*  Preliminary Results* Bilateral lower extremity venous duplex completed. Study was technically difficult and limited due to edema and patient body habitus. Visualized veins of bilateral lower extremities are negative for deep vein thrombosis. There is no evidence of Baker's cyst bilaterally. Bilateral lower extremity veins exhibit pulsatile flow, suggestive of possible elevated right sided heart pressure.  03/05/2015  Gertie Fey, RVT, RDCS, RDMS

## 2015-03-05 NOTE — H&P (Signed)
PULMONARY / CRITICAL CARE MEDICINE HISTORY AND PHYSICAL EXAMINATION   Name: Nancy Hahn MRN: [id number] DOB: [birth year]    ADMISSION DATE:  03/03/2015  PRIMARY SERVICE: PCCM  CHIEF COMPLAINT:  Hemorrhagic infarction  BRIEF PATIENT DESCRIPTION:   82 y/o woman who was admitted to [facility] on 1/18 w/ cold and painful LUE. 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 She was treated with heparin and then transitioned to Elliquis. On the morning of 1/21 she was unable to maintain her balance, slurred speech, and L facial droop. An MRI was was obtained which showed a large cerebral stroke w/ evidence of hemorrhage and concern for developing hydrocephalous. Her function continued to decline and she was intubated. S/p [facility]. She was transferred to [facility] for further evaluation and management.  SIGNIFICANT EVENTS / STUDIES:  Thrombectomy 1/18 >> MRI 1/21 >> Intubated 1/21 >>  LINES / TUBES: 7.5 mm ETT 1/22>>> Urinary catheter>>>  CULTURES: Tracheal aspirate 03/04/15 >  ANTIBIOTICS: Levofloxacin 03/03/2015 > 03/04/15 Vanc 03/04/15 >> Zosyn 03/04/15>>  SUBJECTIVE:  Remains on vent, followed commands, no fevers  VITAL SIGNS: Temp:  [97.4 F (36.3 C)-98.9 F (37.2 C)] 97.4 F (36.3 C) (01/23 0400) Pulse Rate:  [48-65] 63 (01/23 0600) Resp:  [16-21] 20 (01/23 0600) BP: (111-141)/(52-65) 127/60 mmHg (01/23 0600) SpO2:  [100 %] 100 % (01/23 0600) FiO2 (%):  [40 %-50 %] 50 % (01/23 0400) Weight:  [92.3 kg (203 lb 7.8 oz)] 92.3 kg (203 lb 7.8 oz) (01/23 0433) HEMODYNAMICS:   VENTILATOR SETTINGS: Vent Mode:  [-] PRVC FiO2 (%):  [40 %-50 %] 50 % Set Rate:  [16 bmp-20 bmp] 20 bmp Vt Set:  [520 mL] 520 mL PEEP:  [5 cmH20-10 cmH20] 10 cmH20 Plateau Pressure:  [17 cmH20-27 cmH20] 22 cmH20 INTAKE / OUTPUT: Intake/Output      01/22 0701 - 01/23 0700 01/23 0701 - 01/24 0700   I.V. (mL/kg) 432.5 (4.7)    IV Piggyback 400    Total Intake(mL/kg) 832.5  (9)    Urine (mL/kg/hr) 715 (0.3)    Emesis/NG output 150 (0.1)    Total Output 865     Net -32.5            PHYSICAL EXAMINATION: General:  Elderly woman in NAD, rass -1 Neuro:  Able to awake and open eyes to voice. Follows commands. Pupils 3 mm HEENT:  ETT in place, OGT in place Neck: jvd wnl Cardiovascular:   s1 s2 RRR Lungs:  Anterior clear Abdomen:  Soft, bs wnl, no r/g Musculoskeletal:  No swollen joints Skin:  No rashes  LABS:   PULMONARY  Recent Labs Lab 03/03/15 2042  PHART 7.41  PCO2ART 42  PO2ART 73*  HCO3 26.6  O2SAT 94.6    CBC  Recent Labs Lab 03/03/15 0458 03/03/15 2300 03/05/15 0055  HGB 11.2* 9.0* 9.9*  HCT 33.9* 27.3* 28.3*  WBC 15.1* 9.7 7.2  PLT 191 152 138*    COAGULATION  Recent Labs Lab 02/27/15 1957 03/03/15 2300  INR 1.06 1.66*    CARDIAC    Recent Labs Lab 02/27/15 1957  TROPONINI <0.03   No results for input(s): PROBNP in the last 168 hours.   CHEMISTRY  Recent Labs Lab 03/01/15 0103 03/02/15 0746 03/03/15 0458 03/03/15 2300 03/05/15 0055  NA 139 135 140 141 141  K 3.8 3.5 3.4* 3.8 5.4*  CL 111 104 102 109 108  CO2 21*  GLUCOSE  144* 167* 174* 141* 119*  BUN 24*  CREATININE 1.23* 0.72 0.80 0.85 0.84  CALCIUM 8.1* 8.4* 8.6* 8.2* 8.0*  MG  --   --   --  1.9 2.5*  PHOS 3.4  --   --  2.4* 2.0*   Estimated Creatinine Clearance: 62.4 mL/min (by C-G formula based on Cr of 0.84).   LIVER  Recent Labs Lab 02/27/15 1957 03/01/15 0103 03/03/15 2300  AST  --   --  21  ALT  --   --  20  ALKPHOS  --   --  53  BILITOT  --   --  0.8  PROT  --   --  5.5*  ALBUMIN  --  3.3* 2.5*  INR 1.06  --  1.66*     INFECTIOUS  Recent Labs Lab 03/04/15 1030 03/05/15 0055  LATICACIDVEN  --  1.1  PROCALCITON 1.03  --      ENDOCRINE CBG (last 3)   Recent Labs  03/04/15 1939 03/04/15 2334 03/05/15 0330  GLUCAP 82 110* 121*      IMAGING x48h  - image(s) personally  visualized  -   highlighted in bold Dg Abd 1 View  03/03/2015  CLINICAL DATA:  Status post enteric catheter placement. EXAM: ABDOMEN - 1 VIEW COMPARISON:  None. FINDINGS: Enteric catheter seen with tip overlying gastric body. The bowel gas pattern is normal. No radiographic evidence of organomegaly. No radio-opaque calculi or other significant radiographic abnormality are seen. IMPRESSION: Enteric catheter with tip overlying the gastric body. Electronically Signed   By: Ted Mcalpine M.D.   On: 03/03/2015 19:05   Ct Head Wo Contrast  03/04/2015  CLINICAL DATA:  Known left cerebellar infarct, evaluate for hydrocephalus EXAM: CT HEAD WITHOUT CONTRAST TECHNIQUE: Contiguous axial images were obtained from the base of the skull through the vertex without intravenous contrast. COMPARISON:  03/03/2015 FINDINGS: Bony calvarium is intact. There are again noted changes consistent with a left cerebellar infarct with mass-effect upon the fourth ventricle and pons. The lateral ventricles arm more prominent than that seen on a prior exam from 02/26/2014 but relatively stable from the recent MRI examination from the previous day. No acute hemorrhage is identified. No other focal abnormality is seen. IMPRESSION: Left cerebellar infarct with evidence of mild ventricular dilatation stable from the previous day. Electronically Signed   By: Alcide Clever M.D.   On: 03/04/2015 09:07   Mr Nancy Hahn RU Contrast  03/03/2015  CLINICAL DATA:  82 year old hypertensive female with altered mental status and facial weakness. Subsequent encounter. EXAM: MRI HEAD WITHOUT AND WITH CONTRAST TECHNIQUE: Multiplanar, multiecho pulse sequences of the brain and surrounding structures were obtained without and with intravenous contrast. CONTRAST:  18mL MULTIHANCE GADOBENATE DIMEGLUMINE 529 MG/ML IV SOLN COMPARISON:  02/26/2014. FINDINGS: Large acute hemorrhagic left mid to inferior cerebellar infarct with associated mass effect compressing the  fourth ventricle. Interval slight increase in size of ventricles compared to recent CT consistent with developing hydrocephalus. Patient is un safe for lumbar puncture. Mild small vessel disease changes. No intracranial mass separate from above described findings. Fast technique imaging had to be utilized. Left vertebral artery is at least partially patent. Right vertebral artery, basilar artery and internal carotid arteries are patent. Limited assessment of the left posterior inferior cerebellar artery. 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 Interval slight increase in size of ventricles compared to recent CT consistent  with developing hydrocephalus. Left vertebral artery is at least partially patent. Limited assessment of the left posterior inferior cerebellar artery. These results will be called to the ordering clinician or representative by the Radiologist Assistant, and communication documented in the PACS or zVision Dashboard. Electronically Signed   By: Lacy Duverney M.D.   On: 03/03/2015 16:55   Dg Chest Port 1 View  03/05/2015  CLINICAL DATA:  Intubation. EXAM: PORTABLE CHEST 1 VIEW COMPARISON:  03/03/2015. FINDINGS: Endotracheal tube and NG tube in stable position. Cardiomegaly with diffuse bilateral pulmonary alveolar infiltrates, improved from prior exam. No pleural effusion or pneumothorax. IMPRESSION: 1. Lines and tubes in stable position. 2. Cardiomegaly with bilateral pulmonary infiltrates consistent with pulmonary edema. Interim improvement from prior exam. Electronically Signed   By: Maisie Fus  Register   On: 03/05/2015 07:14   Portable Chest Xray  03/03/2015  CLINICAL DATA:  Admission chest radiograph. Known hemorrhagic infarct. Initial encounter. EXAM: PORTABLE CHEST 1 VIEW COMPARISON:  Chest radiograph performed earlier today at 6:39 p.m. FINDINGS: The patient's endotracheal tube is seen ending  2-3 cm above the carina. An enteric tube is noted extending below the diaphragm. Bilateral central airspace opacity raises concern for pulmonary edema. No pleural effusion or pneumothorax is seen. The cardiomediastinal silhouette is mildly enlarged. No acute osseous abnormalities are identified. IMPRESSION: 1. Endotracheal tube seen ending 2-3 cm above the carina. 2. Bilateral central airspace opacity is perhaps slightly worsened and raises concern for pulmonary edema. Mild cardiomegaly. Electronically Signed   By: Roanna Raider M.D.   On: 03/03/2015 23:00   Dg Chest Port 1 View  03/03/2015  CLINICAL DATA:  82 year old female being transferred to [facility] , intubated and sedated now. Cough and shortness of breath. Initial encounter. EXAM: PORTABLE CHEST 1 VIEW COMPARISON:  [facility] 0022 hours today. FINDINGS: Portable AP semi upright view at 1839 hours. Endotracheal tube tip in good position between the level the clavicles and carina. Enteric tube courses to the left upper quadrant, tip not included. Interval worsening confluence of right lung base opacity, favor lower lobe. Upper lobes appear stable. Left lung base is stable and probably unaffected. No superimposed pneumothorax or pulmonary edema. No pleural effusion identified. IMPRESSION: 1. Endotracheal tube tip in good position. Enteric tube courses to the abdomen. 2. Radiographic progression of right lung base pneumonia and/or aspiration since earlier today. No pleural effusion identified. Electronically Signed   By: Odessa Fleming M.D.   On: 03/03/2015 19:04    ASSESSMENT / PLAN:  Active Problems:   Cerebral hemorrhage (HCC)   Acute respiratory failure with hypoxia (HCC)   HCAP (healthcare-associated pneumonia)   PULMONARY A: Pulmonary consolidation Pulmonary Edema Aucte Hypoxic respiratory failure Need for mechanical ventilation Likekly HCAP rt   P:   pcxr in am , improved  aeration rt  50% , peep to goal 5 by end of day if able Consider CPAP 10, ps 10, unable to extubate as of now with these settings Last abg reviewed, keep same MV  CARDIOVASCULAR A: Arterial Thromboembolism P:   Unclear etiology, favor paroxysmal AF. Hold AC for now Would ensure no dvt with pfo, ensure done color to septum / bubble, get dvt  RENAL A: hypophos P:   repelte phos Get k again in pm Avoid free water Allow na to rise  GASTROINTESTINAL A: Hypoalbuminemia Need for enteral nutrition P:   Start feeds Famotidine for PPx  HEMATOLOGIC A: Arterial thrombus, CNS bleed P:  Hold AC further Get dvt study, ensure bubble done  INFECTIOUS A: Possible PNA - HCAP P:   levaquin 03/03/2015  >>1//22/17 Vanc 03/04/15 >> Zosun 03/04/15 > pcxr improved Narrow in am if remains culture neg  ENDOCRINE A: Hyperglycemia P:   SSI  NEUROLOGIC A: Acute stroke, ischemic w/ hemorrhagic conversion Need for sedation AT risk hydro  - following commands  P:   Consider furtehr CT head for risk hydro and /or change in neurostatus Propofol for sedation, with wua  Ccm time 30 min   Mcarthur Rossetti. Tyson Alias, MD, FACP Pgr: [phone] [facility]

## 2015-03-05 NOTE — Progress Notes (Signed)
Initial Nutrition Assessment  DOCUMENTATION CODES:   Obesity unspecified  INTERVENTION:   Initiate Vital High Protein @ 20 ml/hr via NG tube and increase by 10 ml every 4 hours to goal rate of 40 ml/hr.   30 ml Prostat TID.    Tube feeding regimen provides 1260 kcal , 129 grams of protein, and 802 ml of H2O.   NUTRITION DIAGNOSIS:   Inadequate oral intake related to inability to eat as evidenced by NPO status.  GOAL:   Provide needs based on ASPEN/SCCM guidelines  MONITOR:   TF tolerance, I & O's, Vent status, Labs  REASON FOR ASSESSMENT:   Consult Enteral/tube feeding initiation and management  ASSESSMENT:   82 y/o woman who was admitted to [facility] on 1/18 w/ cold and painful LUE. 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 She was treated with heparin and then transitioned to Elliquis. On the morning of 1/21 she was unable to maintain her balance, slurred speech, and L facial droop. An MRI was was obtained which showed a large cerebral stroke w/ evidence of hemorrhage and concern for developing hydrocephalous. Her function continued to decline and she was intubated.  Pt now being treated for possible HCAP Patient is currently intubated on ventilator support MV: 8.1 L/min Temp (24hrs), Avg:98.4 F (36.9 C), Min:97.4 F (36.3 C), Max:98.9 F (37.2 C)  Propofol: off  Medications reviewed and include: sodium phosphate Labs reviewed: potassium elevated 5.4, phosphorus low 2.0, magnesium elevated 2.5 NG tube (tip gastric body)  Diet Order:  Diet NPO time specified  Skin:  Reviewed, no issues  Last BM:  1/17  Height:   Ht Readings from Last 1 Encounters:  03/03/15  (1.727 m)   Weight:   Wt Readings from Last 1 Encounters:  03/05/15 203 lb 7.8 oz (92.3 kg)   Ideal Body Weight:  63.6 kg  BMI:  Body mass index is 30.95 kg/(m^2).  Estimated Nutritional Needs:   Kcal:  [phone]  Protein:  >/= 127  grams  Fluid:  > 1.5 L/day  EDUCATION NEEDS:   No education needs identified at this time  Kendell Bane RD, LDN, CNSC [phone] Pager [phone] After Hours Pager

## 2015-03-06 ENCOUNTER — Inpatient Hospital Stay (HOSPITAL_COMMUNITY): Payer: Commercial Managed Care - HMO

## 2015-03-06 LAB — CBC WITH DIFFERENTIAL/PLATELET
BASOS ABS: 0 10*3/uL (ref 0.0–0.1)
BASOS PCT: 0 %
EOS ABS: 0.2 10*3/uL (ref 0.0–0.7)
EOS PCT: 4 %
HCT: 29.9 % — ABNORMAL LOW (ref 36.0–46.0)
Hemoglobin: 10 g/dL — ABNORMAL LOW (ref 12.0–15.0)
Lymphocytes Relative: 12 %
Lymphs Abs: 0.8 10*3/uL (ref 0.7–4.0)
MCH: 29.7 pg (ref 26.0–34.0)
MCHC: 33.4 g/dL (ref 30.0–36.0)
MCV: 88.7 fL (ref 78.0–100.0)
MONO ABS: 0.3 10*3/uL (ref 0.1–1.0)
MONOS PCT: 5 %
NEUTROS ABS: 4.8 10*3/uL (ref 1.7–7.7)
Neutrophils Relative %: 79 %
PLATELETS: 212 10*3/uL (ref 150–400)
RBC: 3.37 MIL/uL — ABNORMAL LOW (ref 3.87–5.11)
RDW: 13.4 % (ref 11.5–15.5)
WBC: 6.1 10*3/uL (ref 4.0–10.5)

## 2015-03-06 LAB — COMPREHENSIVE METABOLIC PANEL
ALK PHOS: 58 U/L (ref 38–126)
ALT: 14 U/L (ref 14–54)
AST: 14 U/L — AB (ref 15–41)
Albumin: 2.3 g/dL — ABNORMAL LOW (ref 3.5–5.0)
Anion gap: 13 (ref 5–15)
BUN: 20 mg/dL (ref 6–20)
CALCIUM: 8.3 mg/dL — AB (ref 8.9–10.3)
CHLORIDE: 106 mmol/L (ref 101–111)
CO2: 22 mmol/L (ref 22–32)
CREATININE: 0.67 mg/dL (ref 0.44–1.00)
GFR calc non Af Amer: >60 mL/min (ref >60)
GLUCOSE: 180 mg/dL — AB (ref 65–99)
Potassium: 3 mmol/L — ABNORMAL LOW (ref 3.5–5.1)
SODIUM: 141 mmol/L (ref 135–145)
Total Bilirubin: 0.8 mg/dL (ref 0.3–1.2)
Total Protein: 5.7 g/dL — ABNORMAL LOW (ref 6.5–8.1)

## 2015-03-06 LAB — MAGNESIUM: Magnesium: 1.9 mg/dL (ref 1.7–2.4)

## 2015-03-06 LAB — GLUCOSE, CAPILLARY
GLUCOSE-CAPILLARY: 156 mg/dL — AB (ref 65–99)
GLUCOSE-CAPILLARY: 160 mg/dL — AB (ref 65–99)
Glucose-Capillary: 150 mg/dL — ABNORMAL HIGH (ref 65–99)

## 2015-03-06 LAB — CULTURE, RESPIRATORY: SPECIAL REQUESTS: NORMAL

## 2015-03-06 LAB — PROCALCITONIN: PROCALCITONIN: 0.29 ng/mL

## 2015-03-06 LAB — TRIGLYCERIDES: Triglycerides: 104 mg/dL (ref <150)

## 2015-03-06 LAB — PHOSPHORUS: Phosphorus: 2.7 mg/dL (ref 2.5–4.6)

## 2015-03-06 LAB — CULTURE, RESPIRATORY W GRAM STAIN

## 2015-03-06 MED ORDER — POTASSIUM CHLORIDE 20 MEQ/15ML (10%) PO SOLN
30.0000 meq | ORAL | Status: AC
  Administered 2015-03-06 (×2): 30 meq
  Filled 2015-03-06 (×2): qty 30

## 2015-03-06 MED ORDER — FUROSEMIDE 10 MG/ML IJ SOLN
20.0000 mg | Freq: Two times a day (BID) | INTRAMUSCULAR | Status: DC
  Administered 2015-03-06 – 2015-03-07 (×2): 20 mg via INTRAVENOUS
  Filled 2015-03-06 (×2): qty 2

## 2015-03-06 MED ORDER — IOHEXOL 350 MG/ML SOLN
80.0000 mL | Freq: Once | INTRAVENOUS | Status: AC | PRN
  Administered 2015-03-06: 80 mL via INTRAVENOUS

## 2015-03-06 NOTE — Progress Notes (Signed)
[facility] ADULT ICU REPLACEMENT PROTOCOL FOR AM LAB REPLACEMENT ONLY  The patient does apply for the [facility] Adult ICU Electrolyte Replacment Protocol based on the criteria listed below:   1. Is GFR >/= 40 ml/min? Yes.    Patient's GFR today is >60 2. Is urine output >/= 0.5 ml/kg/hr for the last 6 hours? Yes.   Patient's UOP is 1.2 ml/kg/hr 3. Is BUN < 60 mg/dL? Yes.    Patient's BUN today is 20 4. Abnormal electrolyte(s): K3.0 5. Ordered repletion with: Per protocol 6. If a panic level lab has been reported, has the CCM MD in charge been notified? Yes.  .   Physician:  Arsenio Loader, MD  Melrose Nakayama 03/06/2015 6:16 AM

## 2015-03-06 NOTE — Progress Notes (Signed)
STROKE TEAM PROGRESS NOTE   HISTORY OF PRESENT ILLNESS Nancy Hahn is an 82 y.o. female with a history of a breast mass and hypertension who was admitted to [facility] on 02/26/2014 for acute left subclavian artery thrombosis. She underwent thrombectomy on 02/28/2015. Patient was on IV heparin initially and was changed to Eliquis. She became febrile overnight with a throat and coughing with chest x-ray findings consistent with aspiration pneumonia. She also developed a change in mental status and an MRI which is a large acute hemorrhagic left cerebellar infarction mass effect on fourth ventricle. A shunt had not developed hydrocephalus, however. She was intubated on mechanical ventilation and subsequently transferred to [facility] for further management. Reversal of anticoagulation with Eloquis was gone.  LSN: Unclear tPA Given: No: Acute hemorrhagic cerebral infarction in the setting of anticoagulation with Eliquis. mRankin:   SUBJECTIVE (INTERVAL HISTORY) She is neurologically stable.   family is  Not at bedside. She remains intubated but is awake and following commands well.   OBJECTIVE Temp:  [97.5 F (36.4 C)-98.5 F (36.9 C)] 98 F (36.7 C) (01/24 1151) Pulse Rate:  [60-80] 78 (01/24 1400) Cardiac Rhythm:  [-] Normal sinus rhythm (01/24 1400) Resp:  [13-22] 21 (01/24 1400) BP: (129-174)/(61-121) 174/76 mmHg (01/24 1400) SpO2:  [94 %-100 %] 99 % (01/24 1400) FiO2 (%):  [40 %] 40 % (01/24 1000) Weight:  [202 lb 13.2 oz (92 kg)] 202 lb 13.2 oz (92 kg) (01/24 0500)  CBC:   Recent Labs Lab 03/05/15 0055 03/06/15 0507  WBC 7.2 6.1  NEUTROABS 5.6 4.8  HGB 9.9* 10.0*  HCT 28.3* 29.9*  MCV 89.3 88.7  PLT 138* 212    Basic Metabolic Panel:   Recent Labs Lab 03/05/15 0055 03/05/15 2020 03/06/15 0507  NA 141 142 141  K 5.4* 3.9 3.0*  CL 108 106 106  CO2 21* 25 22  GLUCOSE 119* 160* 180*  BUN 24* 22* 20  CREATININE 0.84 0.83 0.67  CALCIUM 8.0* 8.2*  8.3*  MG 2.5*  --  1.9  PHOS 2.0*  --  2.7    Lipid Panel:     Component Value Date/Time   CHOL 108 03/05/2015 0735   TRIG 104 03/06/2015 0507   HDL 22* 03/05/2015 0735   CHOLHDL 4.9 03/05/2015 0735   VLDL 30 03/05/2015 0735   LDLCALC 56 03/05/2015 0735   HgbA1c:  Lab Results  Component Value Date   HGBA1C 6.6* 02/27/2015   Urine Drug Screen: No results found for: LABOPIA, COCAINSCRNUR, LABBENZ, AMPHETMU, THCU, LABBARB    IMAGING  Dg Chest 1 View  03/03/2015   1. Patchy right basilar airspace opacity raises concern for pneumonia. The appearance is less typical for asymmetric pulmonary edema. Mild left perihilar opacity seen.  2. Underlying vascular congestion and mild cardiomegaly.    Dg Abd 1 View 03/03/2015   Enteric catheter with tip overlying the gastric body.      Mr Laqueta Jean Wo Contrast 03/03/2015   Large acute hemorrhagic left mid to inferior cerebellar infarct with associated mass effect compressing the fourth ventricle. Interval slight increase in size of ventricles compared to recent CT consistent with developing hydrocephalus. Left vertebral artery is at least partially patent. Limited assessment of the left posterior inferior cerebellar artery.      Portable Chest Xray 03/03/2015   1. Endotracheal tube seen ending 2-3 cm above the carina.  2. Bilateral central airspace opacity is perhaps slightly worsened and raises concern for pulmonary  edema. Mild cardiomegaly.   Dg Chest Port 1 View 03/03/2015   1. Endotracheal tube tip in good position. Enteric tube courses to the abdomen.  2. Radiographic progression of right lung base pneumonia and/or aspiration since earlier today. No pleural effusion identified.   PHYSICAL EXAM Frail elderly lady who is intubated . Afebrile. Head is nontraumatic. Neck is supple without bruit. Cardiac exam no murmur or gallop. Lungs are clear to auscultation. Distal pulses are well felt. Neurological Exam : Intubated, Awake and  alert. PERRL. Face symmetric. Opens eyes to light stimulation.   Can follow simple commands, nods to questions. No gaze deviation. Does track. Conjugate gaze.No nystagmus. Dysmetric saccades to left gaze. Spontaneous extremity movements. Withdraws in all extremities equally. Tongue midline. Cough and gag intact. No extremity drift or focal weakness. Mild left finger-to-nose dysmetria. Sensation appears intact bilaterally. Toes equivocal.  Gait deferred Gait: Unable to test      ASSESSMENT/PLAN Nancy Hahn is a 82 y.o. female with history of breast mass, hypertension, recent aspiration pneumonia, left subclavian artery thrombosis s/p   thrombectomy on 02/28/2015 and subsequent Eliquis therapy, presenting with altered mental status. She did not receive IV t-PA due to hemorrhage.   Hemorrhagic Stroke: left cerebellar etiology likely LVA embolism from subclavian thrombosis  Resultant  No focal motor deficits but unable to assess balance and walking due to intubation  MRI  - Large acute hemorrhagic left mid to inferior cerebellar infarct with associated mass effect.  MRA - not performed  Carotid Doppler - not indicated  2D Echo - EF 55-60%. No cardiac source of emboli identified.  LDL - not performed  HgbA1c not performed  VTE prophylaxis - SCDs  Diet NPO time specified  aspirin 81 mg daily prior to admission, now on No antithrombotic secondary to hemorrhage.  Ongoing aggressive stroke risk factor management  Therapy recommendations: Pending  Disposition:  Pending  Hypertension  Blood pressure tends to run low   Other Stroke Risk Factors  Advanced age  Obesity, Body mass index is 30.85 kg/(m^2).    Other Active Problems  Anemia - monitor  Pneumonia - CCM following - on Zosyn and vancomycin - Day #3    Hospital day # 1   I personally examined patient and images, and have participated in and made any corrections needed to history, physical, neuro exam,assessment  and plan as stated above.  I have personally obtained the history, evaluated labs, reviewed imaging studies and agree with radiology interpretations. Patient was admitted to [facility] for acute subclavian artery thrombosis and thrombectomy, she was on IV heparin initially and was changed to Eliquis. She had a change in MS found to have hemorrhagic left cerebellar infarction with mass effect on fourth ventricle.  She was intubated on mechanical ventilation and subsequently transferred to [facility] for further management. Repeat CT head   was stable and she is neurologically stable. NSY evaluated and does not recommend intervention at this time. If she develops hydrocephalus she would be a candidate for ventriculostomy placement.   CT angiogram of the neck and brain less than left PICA occlusion of the corpus callosum stroke which is likely embolism from optimal subclavian thrombosis We will not start hypertonic saline despite mass effect on fourth ventricle as patient's clinical exam looks quite good and it has been more than 5 days since her stroke and edema should be on the decline. Discussion with patient and Dr Tyson Alias at the bedside and answered questions  This patient is critically ill  and at significant risk of neurological worsening, death and care requires constant monitoring of vital signs, hemodynamics,respiratory and cardiac monitoring,review of multiple databases, neurological assessment, discussion with family, other specialists and medical decision making of high complexity.I  I spent 30 minutes of neurocritical care time in the care of this patient.  Delia Heady, MD [facility] Pager: [phone] 05/05/2014 5:09 PM     To contact Stroke Continuity provider, please refer to [email or web address]. After hours, contact General Neurology

## 2015-03-06 NOTE — Procedures (Signed)
Extubation Procedure Note  Patient Details:   Name: Nancy Hahn DOB: 1931-02-20 MRN: [id number]   Airway Documentation:     Evaluation  O2 sats: stable throughout Complications: No apparent complications Patient did tolerate procedure well. Bilateral Breath Sounds: Clear, Diminished Suctioning: Oral, Airway Yes  PT was extubated to a 3L [place]. Sats are stable and PT was able to tell me her name   Nancy Hahn 03/06/2015, 11:34 AM

## 2015-03-06 NOTE — Progress Notes (Signed)
PULMONARY / CRITICAL CARE MEDICINE HISTORY AND PHYSICAL EXAMINATION   Name: INGA NOLLER MRN: [id number] DOB: 10-27-1931    ADMISSION DATE:  03/03/2015  PRIMARY SERVICE: PCCM  CHIEF COMPLAINT:  Hemorrhagic infarction   SUBJECTIVE:  Awake, calm, intubated  No events overnight   BRIEF PATIENT DESCRIPTION:   82 y/o woman who was admitted to [facility] on 1/18 w/ cold and painful LUE. 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 She was treated with heparin and then transitioned to Elliquis. On the morning of 1/21 she was unable to maintain her balance, slurred speech, and L facial droop. An MRI was was obtained which showed a large cerebral stroke w/ evidence of hemorrhage and concern for developing hydrocephalous. Her function continued to decline and she was intubated. S/p [facility]. She was transferred to [facility] for further evaluation and management.    VITAL SIGNS: Temp:  [97.5 F (36.4 C)-98.8 F (37.1 C)] 97.9 F (36.6 C) (01/24 0400) Pulse Rate:  [60-74] 61 (01/24 0700) Resp:  [11-21] 20 (01/24 0700) BP: (129-157)/(59-121) 150/74 mmHg (01/24 0700) SpO2:  [94 %-100 %] 96 % (01/24 0700) FiO2 (%):  [40 %-50 %] 40 % (01/24 0700) Weight:  [202 lb 13.2 oz (92 kg)] 202 lb 13.2 oz (92 kg) (01/24 0500) HEMODYNAMICS:   VENTILATOR SETTINGS: Vent Mode:  [-] PRVC FiO2 (%):  [40 %-50 %] 40 % Set Rate:  [20 bmp] 20 bmp Vt Set:  [520 mL] 520 mL PEEP:  [5 cmH20] 5 cmH20 Pressure Support:  [5 cmH20-10 cmH20] 5 cmH20 Plateau Pressure:  [19 cmH20-21 cmH20] 21 cmH20 INTAKE / OUTPUT: Intake/Output      01/23 0701 - 01/24 0700 01/24 0701 - 01/25 0700   I.V. (mL/kg) 220 (2.4)    NG/GT 935.3    IV Piggyback 803.3    Total Intake(mL/kg) 1958.7 (21.3)    Urine (mL/kg/hr) 1850 (0.8)    Emesis/NG output     Total Output 1850     Net +108.7              PHYSICAL EXAMINATION:  General:  Elderly woman, sitting upright in bed, NAD  Neuro:  RASS - 0.  Awake, calm, follows commands. Equal strength bilaterally.  HEENT:  ETT in place, OGT in place. Pupils equal and reactive. Mucous membranes pink and moist.  Cardiovascular:   RRR, no murmurs.  Lungs:  Bilateral expiratory wheezes, good air movement.  Abdomen:  Soft, bs wnl, no r/g Musculoskeletal:  No deformities, no edema.  Skin:  No rashes  LABS:   PULMONARY  Recent Labs Lab 03/03/15 2042  PHART 7.41  PCO2ART 42  PO2ART 73*  HCO3 26.6  O2SAT 94.6    CBC  Recent Labs Lab 03/03/15 2300 03/05/15 0055 03/06/15 0507  HGB 9.0* 9.9* 10.0*  HCT 27.3* 28.3* 29.9*  WBC 9.7 7.2 6.1  PLT 152 138* 212    COAGULATION  Recent Labs Lab 02/27/15 1957 03/03/15 2300  INR 1.06 1.66*    CARDIAC    Recent Labs Lab 02/27/15 1957  TROPONINI <0.03   No results for input(s): PROBNP in the last 168 hours.   CHEMISTRY  Recent Labs Lab 03/01/15 0103  03/03/15 0458 03/03/15 2300 03/05/15 0055 03/05/15 2020 03/06/15 0507  NA 139  < > 140 141 141 142 141  K 3.8  < > 3.4* 3.8 5.4* 3.9 3.0*  CL 111  < > 102 109 108 106 106  CO2 24  < >  25 23 21* 25 22  GLUCOSE 144*  < > 174* 141* 119* 160* 180*  BUN 20  < > 20 20 24* 22* 20  CREATININE 1.23*  < > 0.80 0.85 0.84 0.83 0.67  CALCIUM 8.1*  < > 8.6* 8.2* 8.0* 8.2* 8.3*  MG  --   --   --  1.9 2.5*  --  1.9  PHOS 3.4  --   --  2.4* 2.0*  --  2.7  < > = values in this interval not displayed. Estimated Creatinine Clearance: 65.4 mL/min (by C-G formula based on Cr of 0.67).   LIVER  Recent Labs Lab 02/27/15 1957 03/01/15 0103 03/03/15 2300 03/06/15 0507  AST  --   --  21 14*  ALT  --   --  20 14  ALKPHOS  --   --  53 58  BILITOT  --   --  0.8 0.8  PROT  --   --  5.5* 5.7*  ALBUMIN  --  3.3* 2.5* 2.3*  INR 1.06  --  1.66*  --      INFECTIOUS  Recent Labs Lab 03/04/15 1030 03/05/15 0055 03/05/15 0735 03/06/15 0507  LATICACIDVEN  --  1.1  --   --   PROCALCITON 1.03  --  0.57 0.29     ENDOCRINE CBG  (last 3)   Recent Labs  03/05/15 2019 03/05/15 2356 03/06/15 0355  GLUCAP 140* 160* 156*      IMAGING x48h  - image(s) personally visualized  -   highlighted in bold Dg Chest Port 1 View  03/05/2015  CLINICAL DATA:  Intubation. EXAM: PORTABLE CHEST 1 VIEW COMPARISON:  03/03/2015. FINDINGS: Endotracheal tube and NG tube in stable position. Cardiomegaly with diffuse bilateral pulmonary alveolar infiltrates, improved from prior exam. No pleural effusion or pneumothorax. IMPRESSION: 1. Lines and tubes in stable position. 2. Cardiomegaly with bilateral pulmonary infiltrates consistent with pulmonary edema. Interim improvement from prior exam. Electronically Signed   By: Maisie Fus  Register   On: 03/05/2015 07:14    SIGNIFICANT EVENTS / STUDIES:  Thrombectomy 1/18 >> MRI 1/21 >> Intubated 1/21 >>  LINES / TUBES: 7.5 mm ETT 1/22>>> Urinary catheter>>>  CULTURES: Tracheal aspirate 03/04/15 > No growth   ANTIBIOTICS: Levofloxacin 03/03/2015 > 03/04/15 Vanc 03/04/15 >> Zosyn 03/04/15>>   ASSESSMENT / PLAN:  Active Problems:   Cerebral hemorrhage (HCC)   Acute respiratory failure with hypoxia (HCC)   HCAP (healthcare-associated pneumonia)   Endotracheal tube present   PULMONARY A: Pulmonary consolidation Pulmonary Edema VDRF - likely due to HCAP/pulmonary edema  HCAP  P:   Weaning currently at 5/5, following commands well  Likely candidate for extubation later this afternoon  Consider starting Lasix after repleting potassium  Start duonebs Q6   CARDIOVASCULAR A: Arterial Thromboembolism HTN P:   Unclear etiology, favor paroxysmal AF. No anticoagulation for now  No evidence of DVT on LE doppler lasix  RENAL A: Hypophosphatemia - resolved, 2.7 on 1/24 Hypokalemia - 3.9 on 1/24 P:   Trend BMP Replete K - consider adding lasix for pulmonary edema once done  Avoid free water Mag, phos in am   GASTROINTESTINAL A: Hypoalbuminemia Need for enteral nutrition P:    Continue tube feeds , hold for weaning Famotidine for PPx  HEMATOLOGIC A: Arterial thrombus, CNS bleed Anemia - likely anemia of critical illness  P:   Trend CBC Hold anticoagulation  INFECTIOUS A: Possible PNA - HCAP ( unimpressed) P:   levaquin 03/03/2015  >>  03/04/15 Vanc 03/04/15 >>1/24 Zosyn 03/04/15 >1/24 Consider narrowing antibiotics given two days negative culture   ENDOCRINE A: Hyperglycemia P:   SSI  NEUROLOGIC A: Acute stroke, ischemic w/ hemorrhagic conversion Sedated AT risk hydro - some narrowing of fourth ventricle has progressed on CT 1/24   P:   Frequent neuro checks Minimize sedation in preparation for possible extubation today 1/24 Ct reviewed done this am   Ccm time 30 min   Mcarthur Rossetti. Tyson Alias, MD, FACP Pgr: [phone]  Pulmonary & Critical Care

## 2015-03-06 NOTE — Progress Notes (Signed)
Patient transported to CT and back to 45M-08 without complication.  ETT suctioned prior to departing, small amt thin, white sputum.  Tolerated well.

## 2015-03-06 NOTE — Care Management Note (Signed)
Case Management Note  Patient Details  Name: Nancy Hahn MRN: [id number] Date of Birth: 1931/12/06  Subjective/Objective:    Pt admitted on 1/21/7 s/p cerebral hemorrhage.  PTA, pt resided at home with sister.  Recently discharged from hospital; active with [facility] for HHPT/OT.                  Action/Plan: Will follow for discharge planning needs; will need PT/OT consults when able to tolerate.    Expected Discharge Date:                  Expected Discharge Plan:  Home w Home Health Services  In-House Referral:     Discharge planning Services  CM Consult  Post Acute Care Choice:    Choice offered to:     DME Arranged:    DME Agency:     HH Arranged:    HH Agency:     Status of Service:  In process, will continue to follow  Medicare Important Message Given:    Date Medicare IM Given:    Medicare IM give by:    Date Additional Medicare IM Given:    Additional Medicare Important Message give by:     If discussed at Long Length of Stay Meetings, dates discussed:    Additional Comments:  Quintella Baton, RN, BSN  Trauma/Neuro ICU Case Manager [phone]

## 2015-03-07 ENCOUNTER — Inpatient Hospital Stay (HOSPITAL_COMMUNITY): Payer: Commercial Managed Care - HMO

## 2015-03-07 DIAGNOSIS — R270 Ataxia, unspecified: Secondary | ICD-10-CM

## 2015-03-07 LAB — BASIC METABOLIC PANEL
Anion gap: 17 — ABNORMAL HIGH (ref 5–15)
BUN: 13 mg/dL (ref 6–20)
CHLORIDE: 97 mmol/L — AB (ref 101–111)
CO2: 26 mmol/L (ref 22–32)
CREATININE: 0.84 mg/dL (ref 0.44–1.00)
Calcium: 8.5 mg/dL — ABNORMAL LOW (ref 8.9–10.3)
GFR calc Af Amer: >60 mL/min (ref >60)
GLUCOSE: 149 mg/dL — AB (ref 65–99)
POTASSIUM: 3.5 mmol/L (ref 3.5–5.1)
Sodium: 140 mmol/L (ref 135–145)

## 2015-03-07 LAB — CBC WITH DIFFERENTIAL/PLATELET
Basophils Absolute: 0 10*3/uL (ref 0.0–0.1)
Basophils Relative: 0 %
Eosinophils Absolute: 0.3 10*3/uL (ref 0.0–0.7)
Eosinophils Relative: 3 %
HCT: 32.6 % — ABNORMAL LOW (ref 36.0–46.0)
Hemoglobin: 10.9 g/dL — ABNORMAL LOW (ref 12.0–15.0)
LYMPHS ABS: 0.6 10*3/uL — AB (ref 0.7–4.0)
LYMPHS PCT: 7 %
MCH: 30 pg (ref 26.0–34.0)
MCHC: 33.4 g/dL (ref 30.0–36.0)
MCV: 89.8 fL (ref 78.0–100.0)
MONOS PCT: 7 %
Monocytes Absolute: 0.6 10*3/uL (ref 0.1–1.0)
Neutro Abs: 7.2 10*3/uL (ref 1.7–7.7)
Neutrophils Relative %: 83 %
Platelets: 299 10*3/uL (ref 150–400)
RBC: 3.63 MIL/uL — AB (ref 3.87–5.11)
RDW: 13.4 % (ref 11.5–15.5)
WBC: 8.7 10*3/uL (ref 4.0–10.5)

## 2015-03-07 LAB — PHOSPHORUS: Phosphorus: 4 mg/dL (ref 2.5–4.6)

## 2015-03-07 LAB — MAGNESIUM: Magnesium: 1.9 mg/dL (ref 1.7–2.4)

## 2015-03-07 MED ORDER — FUROSEMIDE 10 MG/ML IJ SOLN
20.0000 mg | Freq: Once | INTRAMUSCULAR | Status: AC
  Administered 2015-03-07: 20 mg via INTRAVENOUS
  Filled 2015-03-07: qty 2

## 2015-03-07 MED ORDER — POTASSIUM CHLORIDE 10 MEQ/100ML IV SOLN
10.0000 meq | INTRAVENOUS | Status: AC
  Administered 2015-03-07 (×2): 10 meq via INTRAVENOUS
  Filled 2015-03-07 (×2): qty 100

## 2015-03-07 MED ORDER — HYDRALAZINE HCL 25 MG PO TABS
25.0000 mg | ORAL_TABLET | Freq: Three times a day (TID) | ORAL | Status: DC
  Administered 2015-03-07 – 2015-03-08 (×4): 25 mg via ORAL
  Filled 2015-03-07 (×4): qty 1

## 2015-03-07 MED ORDER — ANTISEPTIC ORAL RINSE SOLUTION (CORINZ)
7.0000 mL | OROMUCOSAL | Status: DC
  Administered 2015-03-07 (×3): 7 mL via OROMUCOSAL

## 2015-03-07 MED ORDER — SODIUM CHLORIDE 0.9 % IV SOLN
INTRAVENOUS | Status: DC
  Administered 2015-03-08: 05:00:00 via INTRAVENOUS

## 2015-03-07 MED ORDER — ONDANSETRON HCL 4 MG/2ML IJ SOLN
4.0000 mg | Freq: Four times a day (QID) | INTRAMUSCULAR | Status: DC | PRN
  Administered 2015-03-07 – 2015-03-10 (×2): 4 mg via INTRAVENOUS
  Filled 2015-03-07 (×3): qty 2

## 2015-03-07 NOTE — Progress Notes (Signed)
Pt vomited x 1 small amount. Pt is  A&O x 4, MAE purposefully and follows commands. Blood pressure 170/75. Dr. Hosie Poisson notified. Orders received for IV Zofran 4 mg @ 6hr PRN. Also mentioned to Dr. Marguerita Merles that patient's blood pressure has been in 160s-170s this shift. No new orders for blood pressure at this time.

## 2015-03-07 NOTE — Progress Notes (Signed)
Pt arrived to 56m15 from 3mw.  Pt is alert and oriented.  Pt did have one episode of vomiting after sliding her to the next bed.  Safety measures in place.  Will continue to monitor.  Estanislado Emms, RN

## 2015-03-07 NOTE — Evaluation (Signed)
Physical Therapy Evaluation Patient Details Name: Nancy Hahn MRN: [id number] DOB: [birth year] Today's Date: 03/07/2015   History of Present Illness  pt presents with L Cerebellar Hemorrhage with mass effect on 4th ventricle.  pt with recent L Subclavian Artery thrombectomy on 02/28/15 and was put on blood thinners.  pt with hx of HTN and recent PNA.    Clinical Impression  Pt with very flat affect and needs prompting to answer questions at times.  Pt does have nystagmus with L gaze and mild nystagmus with R gaze, but denies dizziness when asked.  Pt somewhat impulsive and will start to lean anteriorly with just mentioning mobility.  Feel pt would benefit from CIR at D/C to maximize independence.  Will continue to follow.      Follow Up Recommendations CIR    Equipment Recommendations  None recommended by PT    Recommendations for Other Services Rehab consult     Precautions / Restrictions Precautions Precautions: Fall Restrictions Weight Bearing Restrictions: No      Mobility  Bed Mobility Overal bed mobility: Needs Assistance Bed Mobility: Supine to Sit     Supine to sit: Mod assist;HOB elevated     General bed mobility comments: pt with ataxic movements and needs A for bringing trunk up to sitting and scooting to EOB.    Transfers Overall transfer level: Needs assistance Equipment used: 2 person hand held assist Transfers: Sit to/from [employer] Sit to Stand: Min assist;+2 physical assistance Stand pivot transfers: Mod assist;+2 physical assistance       General transfer comment: pt does well with coming to standing, but with decreased coordination for pivoting to 3-in-1 and then to recliner.  Impulsive with transfers and leans anteriorly needing MinA to correct.    Ambulation/Gait                Stairs            Wheelchair Mobility    Modified Rankin (Stroke Patients Only) Modified Rankin (Stroke Patients Only) Pre-Morbid  Rankin Score: No symptoms Modified Rankin: Severe disability     Balance Overall balance assessment: Needs assistance Sitting-balance support: Bilateral upper extremity supported;Feet supported Sitting balance-Leahy Scale: Fair Sitting balance - Comments: MinG to MinA to maintain balance.   Standing balance support: During functional activity Standing balance-Leahy Scale: Poor                               Pertinent Vitals/Pain Pain Assessment: No/denies pain    Home Living Family/patient expects to be discharged to:: Inpatient rehab                      Prior Function Level of Independence: Independent               Hand Dominance        Extremity/Trunk Assessment   Upper Extremity Assessment: Defer to OT evaluation           Lower Extremity Assessment: Generalized weakness;RLE deficits/detail;LLE deficits/detail RLE Deficits / Details: Strength adn ROM WFL, with decreased coordination.  Sensation intact. LLE Deficits / Details: Strength and ROM WFL with mild decreased coordination.  Sensation intact.  Cervical / Trunk Assessment: Kyphotic  Communication   Communication: No difficulties (But, needs prompting to respond.)  Cognition Arousal/Alertness: Lethargic Behavior During Therapy: Flat affect Overall Cognitive Status: Difficult to assess  General Comments      Exercises        Assessment/Plan    PT Assessment Patient needs continued PT services  PT Diagnosis Difficulty walking   PT Problem List Decreased activity tolerance;Decreased balance;Decreased mobility;Decreased coordination;Decreased knowledge of use of DME  PT Treatment Interventions DME instruction;Gait training;Stair training;Functional mobility training;Therapeutic activities;Therapeutic exercise;Balance training;Neuromuscular re-education;Patient/family education   PT Goals (Current goals can be found in the Care Plan section)  Acute Rehab PT Goals Patient Stated Goal: To go home PT Goal Formulation: With patient Time For Goal Achievement: 03/21/15 Potential to Achieve Goals: Good    Frequency Min 4X/week   Barriers to discharge        Co-evaluation               End of Session Equipment Utilized During Treatment: Gait belt Activity Tolerance: Patient limited by fatigue Patient left: in chair;with call bell/phone within reach Nurse Communication: Mobility status         Time: 1610-9604 PT Time Calculation (min) (ACUTE ONLY): 33 min   Charges:   PT Evaluation $PT Eval Moderate Complexity: 1 Procedure PT Treatments $Therapeutic Activity: 8-22 mins   PT G CodesSunny Schlein, [place] [phone] 03/07/2015, 2:51 PM

## 2015-03-07 NOTE — Evaluation (Signed)
Clinical/Bedside Swallow Evaluation Patient Details  Name: Nancy Hahn MRN: [id number] Date of Birth: January 16, 1932  Today's Date: 03/07/2015 Time: SLP Start Time (ACUTE ONLY): 0957 SLP Stop Time (ACUTE ONLY): 1017 SLP Time Calculation (min) (ACUTE ONLY): 20 min  Past Medical History:  Past Medical History  Diagnosis Date  . Breast mass   . Hypertension    Past Surgical History:  Past Surgical History  Procedure Laterality Date  . Breast surgery  1980    biopsy  . Dilation and curettage of uterus    . Foot surgery    . Peripheral vascular catheterization Left 02/28/2015    Procedure: Upper Extremity Angiography;  Surgeon: Renford Dills, MD;  Location: [facility] INVASIVE CV LAB;  Service: Cardiovascular;  Laterality: Left;  . Peripheral vascular catheterization  02/28/2015    Procedure: Upper Extremity Intervention;  Surgeon: Renford Dills, MD;  Location: [facility] INVASIVE CV LAB;  Service: Cardiovascular;;  . Embolectomy Left 02/28/2015    Procedure: EMBOLECTOMY ;  Surgeon: Renford Dills, MD;  Location: [facility] ORS;  Service: Vascular;  Laterality: Left;   HPI:  82 y/o woman who was admitted to [facility] on 1/18 w/ cold and painful LUE. 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 She was treated with heparin and then transitioned to Elliquis. On the morning of 1/21 she was unable to maintain her balance, slurred speech, and L facial droop. An MRI was was obtained which showed a large cerebral stroke w/ evidence of hemorrhage and concern for developing hydrocephalous. Her function continued to decline and she was intubated 1/21-1/24.    Assessment / Plan / Recommendation Clinical Impression  Patient presents with evidence of a mild dysphagia characterized by very mild left sided facial and lingual weakness due to CN impairment s/p CVA as well as suspected sensory component resulting from 3 day intubation (cannot r/o neuro origin). Immediate  cough response noted with large straw sip of thin liquids suspected due to delay in swallow initiation, eliminated with cueing for small single cups sips. Recommend initiation of a po diet with aspiration precautions. SLP will f/u for tolerance and potential to lift precautions/restrictions.     Aspiration Risk  Mild aspiration risk;Moderate aspiration risk    Diet Recommendation Regular;Thin liquid   Liquid Administration via: No straw;Cup Medication Administration: Whole meds with liquid (one at a time) Supervision: Patient able to self feed;Intermittent supervision to cue for compensatory strategies Compensations: Slow rate;Small sips/bites Postural Changes: Seated upright at 90 degrees    Other  Recommendations Oral Care Recommendations: Oral care BID   Follow up Recommendations   (TBD)    Frequency and Duration min 2x/week  1 week       Prognosis Prognosis for Safe Diet Advancement: Good      Swallow Study   General HPI: 82 y/o woman who was admitted to [facility] on 1/18 w/ cold and painful LUE. 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 She was treated with heparin and then transitioned to Elliquis. On the morning of 1/21 she was unable to maintain her balance, slurred speech, and L facial droop. An MRI was was obtained which showed a large cerebral stroke w/ evidence of hemorrhage and concern for developing hydrocephalous. Her function continued to decline and she was intubated 1/21-1/24.  Type of Study: Bedside Swallow Evaluation Previous Swallow Assessment: none reported Diet Prior to this Study: NPO Temperature Spikes Noted: No Respiratory  Status: Room air History of Recent Intubation: Yes Length of Intubations (days): 3 days Date extubated: 03/06/15 Behavior/Cognition: Alert;Cooperative;Pleasant mood Oral Cavity Assessment: Other (comment) (appearance of mild mid lingual thrush) Oral Care Completed by SLP: Recent  completion by staff Oral Cavity - Dentition: Dentures, top;Dentures, bottom Vision: Functional for self-feeding Self-Feeding Abilities: Able to feed self Patient Positioning: Upright in bed Baseline Vocal Quality: Hoarse;Low vocal intensity Volitional Cough: Strong Volitional Swallow: Able to elicit    Oral/Motor/Sensory Function Overall Oral Motor/Sensory Function: Mild impairment Facial ROM: Reduced left;Suspected CN VII (facial) dysfunction Facial Symmetry: Within Functional Limits Facial Strength: Within Functional Limits Facial Sensation: Within Functional Limits Lingual ROM: Within Functional Limits Lingual Symmetry: Within Functional Limits Lingual Strength: Reduced;Suspected CN XII (hypoglossal) dysfunction (mild) Lingual Sensation: Within Functional Limits Velum: Within Functional Limits Mandible: Within Functional Limits   Ice Chips Ice chips: Within functional limits Presentation: Spoon   Thin Liquid Thin Liquid: Impaired Presentation: Straw Pharyngeal  Phase Impairments: Suspected delayed Swallow;Cough - Immediate    Nectar Thick Nectar Thick Liquid: Not tested   Honey Thick Honey Thick Liquid: Not tested   Puree Puree: Within functional limits Presentation: Spoon   Solid   GO  Nancy Otting MA, CCC-SLP [phone]  Solid: Within functional limits (independent small bites) Presentation: Self Fed        Nancy Hahn Nancy Hahn 03/07/2015,10:33 AM

## 2015-03-07 NOTE — Evaluation (Signed)
Occupational Therapy Evaluation Patient Details Name: Nancy Hahn MRN: [id number] DOB: 08-Jun-1931 Today's Date: 03/07/2015    History of Present Illness pt presents with L Cerebellar Hemorrhage with mass effect on 4th ventricle.  pt with recent L Subclavian Artery thrombectomy on 02/28/15 and was put on blood thinners.  pt with hx of HTN and recent PNA.     Clinical Impression   Pt reports she lives alone and was independent with ADLs PTA. Pt presenting with ataxia when sitting unsupported and in standing and with decreased insight into her deficits. Requires verbal cues and prompting throughout activities for sequencing and safety. Recommending CIR level therapies for further rehab prior returning home to maximize independence and safety with ADLs and functional mobility. Pt would benefit from continued skilled OT in order to increase independence and safety with UB/LB dressing, toilet transfers, and grooming activities in standing.     Follow Up Recommendations  CIR;Supervision/Assistance - 24 hour    Equipment Recommendations  Other (comment) (TBD)    Recommendations for Other Services Rehab consult     Precautions / Restrictions Precautions Precautions: Fall Restrictions Weight Bearing Restrictions: No      Mobility Bed Mobility Overal bed mobility: Needs Assistance Bed Mobility: Sit to Supine     Supine to sit: Mod assist;HOB elevated Sit to supine: Min assist   General bed mobility comments: Min assist to control trunk descent to flat bed. Pt able to manage LEs into bed with min guard to guide.  Transfers Overall transfer level: Needs assistance Equipment used: 1 person hand held assist Transfers: Sit to/from [employer] Sit to Stand: Mod assist Stand pivot transfers: Mod assist       General transfer comment: Pt able to boost up from sit to stand from chair but requires mod assist in standing for balance with swaying and ataxia. VCs throughout  for sequencing.    Balance Overall balance assessment: Needs assistance Sitting-balance support: No upper extremity supported;Feet supported Sitting balance-Leahy Scale: Poor Sitting balance - Comments: MinG to MinA to maintain balance.   Standing balance support: Bilateral upper extremity supported Standing balance-Leahy Scale: Poor                              ADL Overall ADL's : Needs assistance/impaired     Grooming: Min guard;Sitting;Brushing hair;Cueing for sequencing               Lower Body Dressing: Min guard;Sitting/lateral leans;Cueing for sequencing;Cueing for safety Lower Body Dressing Details (indicate cue type and reason): Pt able to reach bilateral feet to pull up socks. Toilet Transfer: Moderate assistance;Stand-pivot;BSC;Cueing for safety;Cueing for sequencing Toilet Transfer Details (indicate cue type and reason): Simulated by stand pivot from chair to EOB. Toileting- Clothing Manipulation and Hygiene: Moderate assistance;Bed level Toileting - Clothing Manipulation Details (indicate cue type and reason): Mod assist given to wipe bottom at bed level. Pt able to roll with verbal cues for sequencing and hand placement.     Functional mobility during ADLs: Moderate assistance (for stand pivot) General ADL Comments: No family present for OT eval. Pt noted to have B UE and trunk ataxia. Pt with difficulty maintaining unsupported sitting; noted to have bias to L side when sitting unsupported. Pt noted to have difficulty with anterior hip translation to scoot hips to edge of chair; verbal and tactile cues given.       Vision Vision Assessment?: Vision impaired- to be further tested  in functional context   Perception     Praxis      Pertinent Vitals/Pain Pain Assessment: No/denies pain     Hand Dominance Right   Extremity/Trunk Assessment Upper Extremity Assessment Upper Extremity Assessment: Generalized weakness;RUE deficits/detail;LUE  deficits/detail RUE Deficits / Details: AROM and strength overall WFL. Decreased gross motor coordination  RUE Coordination: decreased gross motor;decreased fine motor LUE Deficits / Details: AROM and strength overall WFL. Decreased gross motor coordination  LUE Coordination: decreased gross motor;decreased fine motor   Lower Extremity Assessment Lower Extremity Assessment: Defer to PT evaluation RLE Deficits / Details: Strength adn ROM WFL, with decreased coordination.  Sensation intact. RLE Coordination: decreased fine motor LLE Deficits / Details: Strength and ROM WFL with mild decreased coordination.  Sensation intact. LLE Coordination: decreased fine motor   Cervical / Trunk Assessment Cervical / Trunk Assessment: Kyphotic   Communication Communication Communication: No difficulties   Cognition Arousal/Alertness: Lethargic Behavior During Therapy: Flat affect Overall Cognitive Status: No family/caregiver present to determine baseline cognitive functioning (Pt with decreased awareness of deficits)                     General Comments       Exercises       Shoulder Instructions      Home Living Family/patient expects to be discharged to:: Inpatient rehab Living Arrangements: Alone Available Help at Discharge: Family;Available PRN/intermittently (nephew lives across the street) Type of Home: House       Home Layout: Two level;Able to live on main level with bedroom/bathroom                          Prior Functioning/Environment Level of Independence: Independent             OT Diagnosis: Generalized weakness;Ataxia;Altered mental status   OT Problem List: Decreased activity tolerance;Impaired balance (sitting and/or standing);Impaired vision/perception;Decreased coordination;Decreased cognition;Decreased safety awareness;Decreased knowledge of use of DME or AE   OT Treatment/Interventions: Self-care/ADL training;Energy conservation;DME and/or  AE instruction;Therapeutic activities;Balance training;Patient/family education    OT Goals(Current goals can be found in the care plan section) Acute Rehab OT Goals Patient Stated Goal: To go home OT Goal Formulation: With patient Time For Goal Achievement: 03/21/15 Potential to Achieve Goals: Good ADL Goals Pt Will Perform Eating: Independently;sitting Pt Will Perform Grooming: with supervision;standing Pt Will Perform Upper Body Bathing: with supervision;sitting Pt Will Perform Lower Body Bathing: sit to/from stand;with supervision Pt Will Transfer to Toilet: with min guard assist;stand pivot transfer;bedside commode Pt Will Perform Toileting - Clothing Manipulation and hygiene: with supervision;sit to/from stand  OT Frequency: Min 2X/week   Barriers to D/C: Decreased caregiver support  Pt lives alone       Co-evaluation              End of Session Equipment Utilized During Treatment: Gait belt Nurse Communication: Mobility status  Activity Tolerance: Patient tolerated treatment well Patient left: in bed;with call bell/phone within reach;with SCD's reapplied   Time: 2956-2130 OT Time Calculation (min): 24 min Charges:  OT General Charges $OT Visit: 1 Procedure OT Evaluation $OT Eval Moderate Complexity: 1 Procedure OT Treatments $Self Care/Home Management : 8-22 mins G-Codes:     Gaye Alken M.S., OTR/L Pager: [phone]  03/07/2015, 5:23 PM

## 2015-03-07 NOTE — Progress Notes (Signed)
PULMONARY / CRITICAL CARE MEDICINE HISTORY AND PHYSICAL EXAMINATION   Name: Nancy Hahn MRN: [id number] DOB: 04-29-1931    ADMISSION DATE:  03/03/2015  PRIMARY SERVICE: PCCM  CHIEF COMPLAINT:  Hemorrhagic infarction   SUBJECTIVE:  One episode of vomiting overnight, otherwise no events  Doing well following extubation, denies CP, SOB  CXR improving after Lasix  Neg 4.2 liters  BRIEF PATIENT DESCRIPTION:   82 y/o woman who was admitted to [facility] on 1/18 w/ cold and painful LUE. 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 She was treated with heparin and then transitioned to Elliquis. On the morning of 1/21 she was unable to maintain her balance, slurred speech, and L facial droop. An MRI was was obtained which showed a large cerebral stroke w/ evidence of hemorrhage and concern for developing hydrocephalous. Her function continued to decline and she was intubated. S/p [facility]. She was transferred to [facility] for further evaluation and management.  VITAL SIGNS: Temp:  [97.5 F (36.4 C)-98.8 F (37.1 C)] 97.9 F (36.6 C) (01/24 0400) Pulse Rate:  [60-74] 61 (01/24 0700) Resp:  [11-21] 20 (01/24 0700) BP: (129-157)/(59-121) 150/74 mmHg (01/24 0700) SpO2:  [94 %-100 %] 96 % (01/24 0700) FiO2 (%):  [40 %-50 %] 40 % (01/24 0700) Weight:  [202 lb 13.2 oz (92 kg)] 202 lb 13.2 oz (92 kg) (01/24 0500) HEMODYNAMICS:   VENTILATOR SETTINGS: Vent Mode:  [-] PRVC FiO2 (%):  [40 %-50 %] 40 % Set Rate:  [20 bmp] 20 bmp Vt Set:  [520 mL] 520 mL PEEP:  [5 cmH20] 5 cmH20 Pressure Support:  [5 cmH20-10 cmH20] 5 cmH20 Plateau Pressure:  [19 cmH20-21 cmH20] 21 cmH20 INTAKE / OUTPUT: Intake/Output      01/23 0701 - 01/24 0700 01/24 0701 - 01/25 0700   I.V. (mL/kg) 220 (2.4)    NG/GT 935.3    IV Piggyback 803.3    Total Intake(mL/kg) 1958.7 (21.3)    Urine (mL/kg/hr) 1850 (0.8)    Emesis/NG output     Total Output 1850     Net +108.7               PHYSICAL EXAMINATION:  General:  Elderly woman, sitting upright in bed, NAD  Neuro:  RASS - 0. Awake, calm, follows commands. Equal strength bilaterally.  HEENT:  Pupils equal and reactive. Mucous membranes pink and moist.  Cardiovascular:   RRR, no murmurs.  Lungs:  Mild coarse rhonchi bilaterally, good air movement.  Abdomen:  Soft, bs wnl, no r/g Musculoskeletal:  No deformities, no edema.  Skin:  No rashes  LABS:   PULMONARY  Recent Labs Lab 03/03/15 2042  PHART 7.41  PCO2ART 42  PO2ART 73*  HCO3 26.6  O2SAT 94.6    CBC  Recent Labs Lab 03/03/15 2300 03/05/15 0055 03/06/15 0507  HGB 9.0* 9.9* 10.0*  HCT 27.3* 28.3* 29.9*  WBC 9.7 7.2 6.1  PLT 152 138* 212    COAGULATION  Recent Labs Lab 02/27/15 1957 03/03/15 2300  INR 1.06 1.66*    CARDIAC    Recent Labs Lab 02/27/15 1957  TROPONINI <0.03   No results for input(s): PROBNP in the last 168 hours.   CHEMISTRY  Recent Labs Lab 03/01/15 0103  03/03/15 0458 03/03/15 2300 03/05/15 0055 03/05/15 2020 03/06/15 0507  NA 139  < > 140 141 141 142 141  K 3.8  < > 3.4* 3.8 5.4* 3.9 3.0*  CL 111  < >  102 109 108 106 106  CO2 24  < > 25 23 21* 25 22  GLUCOSE 144*  < > 174* 141* 119* 160* 180*  BUN 20  < > 20 20 24* 22* 20  CREATININE 1.23*  < > 0.80 0.85 0.84 0.83 0.67  CALCIUM 8.1*  < > 8.6* 8.2* 8.0* 8.2* 8.3*  MG  --   --   --  1.9 2.5*  --  1.9  PHOS 3.4  --   --  2.4* 2.0*  --  2.7  < > = values in this interval not displayed. Estimated Creatinine Clearance: 65.4 mL/min (by C-G formula based on Cr of 0.67).   LIVER  Recent Labs Lab 02/27/15 1957 03/01/15 0103 03/03/15 2300 03/06/15 0507  AST  --   --  21 14*  ALT  --   --  20 14  ALKPHOS  --   --  53 58  BILITOT  --   --  0.8 0.8  PROT  --   --  5.5* 5.7*  ALBUMIN  --  3.3* 2.5* 2.3*  INR 1.06  --  1.66*  --      INFECTIOUS  Recent Labs Lab 03/04/15 1030 03/05/15 0055 03/05/15 0735 03/06/15 0507  LATICACIDVEN   --  1.1  --   --   PROCALCITON 1.03  --  0.57 0.29     ENDOCRINE CBG (last 3)   Recent Labs  03/05/15 2019 03/05/15 2356 03/06/15 0355  GLUCAP 140* 160* 156*      IMAGING x48h  - image(s) personally visualized  -   highlighted in bold Dg Chest Port 1 View  03/05/2015  CLINICAL DATA:  Intubation. EXAM: PORTABLE CHEST 1 VIEW COMPARISON:  03/03/2015. FINDINGS: Endotracheal tube and NG tube in stable position. Cardiomegaly with diffuse bilateral pulmonary alveolar infiltrates, improved from prior exam. No pleural effusion or pneumothorax. IMPRESSION: 1. Lines and tubes in stable position. 2. Cardiomegaly with bilateral pulmonary infiltrates consistent with pulmonary edema. Interim improvement from prior exam. Electronically Signed   By: Maisie Fus  Register   On: 03/05/2015 07:14    SIGNIFICANT EVENTS / STUDIES:  Thrombectomy 1/18 MRI 1/21 >> Intubated 1/21 >>1/23  LINES / TUBES: 7.5 mm ETT 1/22>>> Urinary catheter>>>  CULTURES: Tracheal aspirate 03/04/15 > No growth   ANTIBIOTICS: Levofloxacin 03/03/2015 > 03/04/15 Vanc 03/04/15 >>1/24 Zosyn 03/04/15>>1/24   ASSESSMENT / PLAN:  Active Problems:   Cerebral hemorrhage (HCC)   Acute respiratory failure with hypoxia (HCC)   HCAP (healthcare-associated pneumonia)   Endotracheal tube present   PULMONARY A: Pulmonary consolidation Pulmonary Edema - great response to Lasix, 4.5 L down  VDRF - resolved, extubated  HCAP  P:   O2 PRN to keep sats > 92% Continue Lasix 20 mg BID, consider reduce Encourage pulmonary hygiene - MOBILIZE AS ABLE, incentive spirometry   CARDIOVASCULAR A: Arterial Thromboembolism - unclear etiology, favor paroxysmal Afib HTN P:   Not candidate for anticoagulation in setting of ICH  Continue Lasix , but reduce Will need to slowly add back home BP meds prior to discharge (Atenolol, Lisinopril)  RENAL A: Hypophosphatemia - resolved, 4.0 on 1/25 Hypokalemia - 3.5 on 1/25 Excellent tolerated  diuresis P:   Trend BMP Continue PO potassium supplementation while on Lasix  Mag, phos in am   Lasix x 1 dose then dc  GASTROINTESTINAL A: Hypoalbuminemia Need for enteral nutrition P:   NPO now - transition diet as able after SLP eval  Famotidine for PPx, dc  once started on diet  HEMATOLOGIC A: Arterial thrombus, CNS bleed Anemia - likely anemia of critical illness  P:   Trend CBC Hold anticoagulation  INFECTIOUS A: Possible PNA - ? HCAP, less likely given negative respiratory cultures P:   Follow CBC  levaquin 03/03/2015  >>03/04/15 Vanc 03/04/15 >>1/24 Zosyn 03/04/15 >1/24  ENDOCRINE A: Hyperglycemia P:   Continue SSI  NEUROLOGIC A: Acute stroke, ischemic w/ hemorrhagic conversion AT risk hydro - some narrowing of fourth ventricle has progressed on CT 1/24   P:   No longer needs frequent neuro checks  Stable and improving, can likely transfer out of ICU today, will let neuro decide  Will likely need CIR placement, pending PT eval now  To triad, floor  Mcarthur Rossetti. Tyson Alias, MD, FACP Pgr: [phone]  Pulmonary & Critical Care

## 2015-03-07 NOTE — Progress Notes (Signed)
I await therapy evaluations to assist in planning rehab venue options. I will follow . [phone]

## 2015-03-07 NOTE — Consult Note (Signed)
Physical Medicine and Rehabilitation Consult Reason for Consult: Left cerebellar infarct Referring Physician: Dr. Marchelle Gearing   HPI: Nancy Hahn is a 82 y.o. right handed female with history of hypertension. Patient lives alone in [place]. Independent prior to admission. She has a nephew close by that checks on her. Two-level home with bedroom downstairs. Presented to [facility] on 02/26/2014 for left subclavian artery thrombosis. She underwent thrombectomy on 02/28/2015. Placed on intravenous heparin initially and changed to Eliquis. She became febrile during the night chest x-ray consistent with aspiration pneumonia. 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 Required intubation and subsequently transferred to [facility] for further management 03/04/2015. CTA of the neck showed plaque left carotid bifurcation and proximal left internal carotid artery with maximal 50% diameter stenosis. Bilateral lower extremity Dopplers negative. Patient was extubated 03/06/2015. Currently remains nothing by mouth. Hopefully plan to resume Eliquis. Physical occupational therapy evaluations are pending. M.D. has requested physical medicine rehabilitation consult. Speech therapy swallow eval she is cleared for thin liquids, regular diet  PT has not yet assessed, earlier attempt this morning during a bout of vomiting  Patient is sleeping but she awakens to voice.  Review of Systems  Constitutional: Negative for fever and chills.  HENT: Negative for hearing loss.   Eyes: Negative for blurred vision and double vision.  Respiratory: Negative for cough and shortness of breath.   Cardiovascular: Positive for leg swelling. Negative for chest pain and palpitations.  Gastrointestinal: Positive for constipation. Negative for nausea and vomiting.    Genitourinary: Negative for dysuria and hematuria.  Musculoskeletal: Positive for myalgias.  Skin: Negative for rash.  Neurological: Positive for weakness and headaches. Negative for seizures and loss of consciousness.  All other systems reviewed and are negative.  Past Medical History  Diagnosis Date  . Breast mass   . Hypertension    Past Surgical History  Procedure Laterality Date  . Breast surgery  1980    biopsy  . Dilation and curettage of uterus    . Foot surgery    . Peripheral vascular catheterization Left 02/28/2015    Procedure: Upper Extremity Angiography;  Surgeon: Renford Dills, MD;  Location: [facility] INVASIVE CV LAB;  Service: Cardiovascular;  Laterality: Left;  . Peripheral vascular catheterization  02/28/2015    Procedure: Upper Extremity Intervention;  Surgeon: Renford Dills, MD;  Location: [facility] INVASIVE CV LAB;  Service: Cardiovascular;;  . Embolectomy Left 02/28/2015    Procedure: EMBOLECTOMY ;  Surgeon: Renford Dills, MD;  Location: [facility] ORS;  Service: Vascular;  Laterality: Left;   Family History  Problem Relation Age of Onset  . Emphysema Father   . Cancer Brother     lung  . Cancer Sister     breast  . Cancer Maternal Aunt     ovarian  . Diabetes type II Mother   . Diabetes type II Sister     x3   Social History:  reports that she has never smoked. She has never used smokeless tobacco. She reports that she does not drink alcohol or use illicit drugs. Allergies: No Known Allergies Medications Prior to Admission  Medication Sig Dispense Refill  . aspirin 81 MG tablet Take 81 mg by mouth daily.    Marland Kitchen atenolol (TENORMIN) 50 MG tablet Take 50 mg by mouth daily.    Marland Kitchen lisinopril (PRINIVIL,ZESTRIL) 40 MG  tablet Take 40 mg by mouth daily.    Marland Kitchen NIFEdipine (PROCARDIA XL/ADALAT-CC) 90 MG 24 hr tablet Take 90 mg by mouth daily.    . simvastatin (ZOCOR) 20 MG tablet Take 20 mg by mouth daily.      Home: Home Living Family/patient expects to be  discharged to:: Private residence Living Arrangements: Alone  Functional History:   Functional Status:  Mobility:          ADL:    Cognition: Cognition Orientation Level: Oriented to person, Oriented to time, Oriented to place, Oriented to situation    Blood pressure 179/73, pulse 62, temperature 98.9 F (37.2 C), temperature source Oral, resp. rate 14, height  (1.727 m), weight 87.6 kg (193 lb 2 oz), SpO2 98 %. Physical Exam  HENT:  Head: Normocephalic.  Eyes: EOM are normal.  Neck: Normal range of motion. Neck supple. No thyromegaly present.  Cardiovascular: Normal rate and regular rhythm.   Respiratory: Effort normal and breath sounds normal. No respiratory distress.  GI: Soft. Bowel sounds are normal. She exhibits no distension.  Neurological:  Mood is flat but appropriate. She will initiate conversation. Speech is a bit dysarthric but intelligible. Follows simple commands. She was able to provide name age as well as date of birth.  Skin: Skin is warm and dry.  Left upper extremity has moderate ataxia finger-nose-finger, no evidence of ataxia in right upper extremity or in the lower extremities Motor strength 4/5 bilateral deltoid, biceps, triceps, grip, hip flexor, knee extensor, ankle dorsal flexor and plantar flexor Sensation difficult to assess her attention is poor, She reports sensation to pinch in all 4 extremities Eyes without diplopia, horizontal nystagmus left greater than right  Results for orders placed or performed during the hospital encounter of 03/03/15 (from the past 24 hour(s))  CBC with Differential/Platelet     Status: Abnormal   Collection Time: 03/07/15  4:46 AM  Result Value Ref Range   WBC 8.7 4.0 - 10.5 K/uL   RBC 3.63 (L) 3.87 - 5.11 MIL/uL   Hemoglobin 10.9 (L) 12.0 - 15.0 g/dL   HCT 16.1 (L) 09.6 - 04.5 %   MCV 89.8 78.0 - 100.0 fL   MCH 30.0 26.0 - 34.0 pg   MCHC 33.4 30.0 - 36.0 g/dL   RDW 40.9 81.1 - 91.4 %   Platelets 299 150 -  400 K/uL   Neutrophils Relative % 83 %   Neutro Abs 7.2 1.7 - 7.7 K/uL   Lymphocytes Relative 7 %   Lymphs Abs 0.6 (L) 0.7 - 4.0 K/uL   Monocytes Relative 7 %   Monocytes Absolute 0.6 0.1 - 1.0 K/uL   Eosinophils Relative 3 %   Eosinophils Absolute 0.3 0.0 - 0.7 K/uL   Basophils Relative 0 %   Basophils Absolute 0.0 0.0 - 0.1 K/uL  Basic metabolic panel     Status: Abnormal   Collection Time: 03/07/15  4:46 AM  Result Value Ref Range   Sodium 140 135 - 145 mmol/L   Potassium 3.5 3.5 - 5.1 mmol/L   Chloride 97 (L) 101 - 111 mmol/L   CO2 26 22 - 32 mmol/L   Glucose, Bld 149 (H) 65 - 99 mg/dL   BUN 13 6 - 20 mg/dL   Creatinine, Ser 7.82 0.44 - 1.00 mg/dL   Calcium 8.5 (L) 8.9 - 10.3 mg/dL   GFR calc non Af Amer >60 >60 mL/min   GFR calc Af Amer >60 >60 mL/min   Anion  gap 17 (H) 5 - 15  Magnesium     Status: None   Collection Time: 03/07/15  4:46 AM  Result Value Ref Range   Magnesium 1.9 1.7 - 2.4 mg/dL  Phosphorus     Status: None   Collection Time: 03/07/15  4:46 AM  Result Value Ref Range   Phosphorus 4.0 2.5 - 4.6 mg/dL   Ct Angio Head W/cm &/or Wo Cm  03/06/2015  CLINICAL DATA:  82 year old female with off balance, slurred speech, altered mental status and left facial droop. Left cerebellar infarct. Subsequent encounter. EXAM: CT ANGIOGRAPHY HEAD AND NECK TECHNIQUE: Multidetector CT imaging of the head and neck was performed using the standard protocol during bolus administration of intravenous contrast. Multiplanar CT image reconstructions and MIPs were obtained to evaluate the vascular anatomy. Carotid stenosis measurements (when applicable) are obtained utilizing NASCET criteria, using the distal internal carotid diameter as the denominator. CONTRAST:  80mL OMNIPAQUE IOHEXOL 350 MG/ML SOLN COMPARISON:  03/04/2015 and 02/26/2014 head CT. 03/03/2015 brain MR. FINDINGS: CT HEAD Brain: Large left cerebellar infarct with hemorrhage better appreciated on recently performed MR.  Mass-effect upon the adjacent structures including narrowing of the fourth ventricle and inferior aspect of the aqueduct has progressed slightly since the prior exam (series 201, image 10). Dilated lateral ventricles/ mild hydrocephalus similar to prior exam (best appreciated when compared to remote CT). Calvarium and skull base: Negative. Paranasal sinuses: Minimal mucosal thickening maxillary sinuses greatest inferior right maxillary sinus with slight polypoid opacification. Orbits: Mild exophthalmos. CTA NECK Aortic arch: 3 vessel arch. Right carotid system: Plaque right carotid bifurcation proximal right internal carotid artery with maximal 50% diameter stenosis proximal right internal carotid artery. Left carotid system: Plaque left carotid bifurcation proximal left internal carotid artery with maximal 50% diameter stenosis proximal left internal carotid artery. Vertebral arteries:Right vertebral artery is slightly larger than left. No significant stenosis of the cervical segment of the vertebral arteries on either side. Plaque with mild narrowing right subclavian artery. Skeleton: Cervical spondylotic changes most notable C6-7. Other neck: No worrisome primary neck mass. Small to slightly moderate-size bilateral pleural effusions with adjacent passive atelectasis. Endotracheal tube and nasogastric tube in place. CTA HEAD Anterior circulation: Mild narrowing irregularity M1 segment left middle cerebral artery. Mild narrowing right middle cerebral artery bifurcation. Calcified plaque with mild narrowing cavernous segment internal carotid artery bilaterally. Posterior circulation: Narrowing of the left vertebral artery from the foramen magnum to formation of the basilar artery. Poor delineation of the left posterior inferior cerebellar artery. No significant stenosis of the distal right vertebral artery. Mild irregularity of the basilar artery without significant stenosis. Irregular anterior inferior cerebellar  arteries bilaterally. Fetal type contribution to formation of the left posterior cerebral artery. No high-grade stenosis of the posterior cerebral artery however, distal branch vessel narrowing and irregularity greater on the right. No aneurysm noted. Venous sinuses: Patent. Anatomic variants: As above. Delayed phase: As above. IMPRESSION: CT HEAD Large left cerebellar infarct with hemorrhage better appreciated on recently performed MR. Mass-effect upon the adjacent structures including narrowing of the fourth ventricle and inferior aspect of the aqueduct has progressed slightly since the prior exam (series 201, image 10). Dilated lateral ventricles/ mild hydrocephalus similar to prior exam (best appreciated when compared to remote CT). CTA NECK Plaque carotid bifurcation / proximal internal carotid artery bilaterally with maximal 50% diameter stenosis proximal internal carotid artery bilaterally. Right vertebral artery is slightly larger than left. No significant stenosis of the cervical segment of the vertebral arteries on  either side. Small to slightly moderate-size bilateral pleural effusions with adjacent passive atelectasis. Endotracheal tube and nasogastric tube in place. CTA HEAD Mild narrowing irregularity M1 segment left middle cerebral artery. Mild narrowing right middle cerebral artery bifurcation. Calcified plaque with mild narrowing cavernous segment internal carotid artery bilaterally. Narrowing of the left vertebral artery from the foramen magnum to formation of the basilar artery. Poor delineation of the left posterior inferior cerebellar artery and appears occluded. No significant stenosis of the distal right vertebral artery. Mild irregularity of the basilar artery without significant stenosis. Irregular anterior inferior cerebellar arteries bilaterally. Fetal type contribution to formation of the left posterior cerebral artery. No high-grade stenosis of the posterior cerebral artery however,  distal branch vessel narrowing and irregularity greater on the right. Electronically Signed   By: Lacy Duverney M.D.   On: 03/06/2015 08:04   Ct Angio Neck W/cm &/or Wo/cm  03/06/2015  CLINICAL DATA:  82 year old female with off balance, slurred speech, altered mental status and left facial droop. Left cerebellar infarct. Subsequent encounter. EXAM: CT ANGIOGRAPHY HEAD AND NECK TECHNIQUE: Multidetector CT imaging of the head and neck was performed using the standard protocol during bolus administration of intravenous contrast. Multiplanar CT image reconstructions and MIPs were obtained to evaluate the vascular anatomy. Carotid stenosis measurements (when applicable) are obtained utilizing NASCET criteria, using the distal internal carotid diameter as the denominator. CONTRAST:  80mL OMNIPAQUE IOHEXOL 350 MG/ML SOLN COMPARISON:  03/04/2015 and 02/26/2014 head CT. 03/03/2015 brain MR. FINDINGS: CT HEAD Brain: Large left cerebellar infarct with hemorrhage better appreciated on recently performed MR. Mass-effect upon the adjacent structures including narrowing of the fourth ventricle and inferior aspect of the aqueduct has progressed slightly since the prior exam (series 201, image 10). Dilated lateral ventricles/ mild hydrocephalus similar to prior exam (best appreciated when compared to remote CT). Calvarium and skull base: Negative. Paranasal sinuses: Minimal mucosal thickening maxillary sinuses greatest inferior right maxillary sinus with slight polypoid opacification. Orbits: Mild exophthalmos. CTA NECK Aortic arch: 3 vessel arch. Right carotid system: Plaque right carotid bifurcation proximal right internal carotid artery with maximal 50% diameter stenosis proximal right internal carotid artery. Left carotid system: Plaque left carotid bifurcation proximal left internal carotid artery with maximal 50% diameter stenosis proximal left internal carotid artery. Vertebral arteries:Right vertebral artery is slightly  larger than left. No significant stenosis of the cervical segment of the vertebral arteries on either side. Plaque with mild narrowing right subclavian artery. Skeleton: Cervical spondylotic changes most notable C6-7. Other neck: No worrisome primary neck mass. Small to slightly moderate-size bilateral pleural effusions with adjacent passive atelectasis. Endotracheal tube and nasogastric tube in place. CTA HEAD Anterior circulation: Mild narrowing irregularity M1 segment left middle cerebral artery. Mild narrowing right middle cerebral artery bifurcation. Calcified plaque with mild narrowing cavernous segment internal carotid artery bilaterally. Posterior circulation: Narrowing of the left vertebral artery from the foramen magnum to formation of the basilar artery. Poor delineation of the left posterior inferior cerebellar artery. No significant stenosis of the distal right vertebral artery. Mild irregularity of the basilar artery without significant stenosis. Irregular anterior inferior cerebellar arteries bilaterally. Fetal type contribution to formation of the left posterior cerebral artery. No high-grade stenosis of the posterior cerebral artery however, distal branch vessel narrowing and irregularity greater on the right. No aneurysm noted. Venous sinuses: Patent. Anatomic variants: As above. Delayed phase: As above. IMPRESSION: CT HEAD Large left cerebellar infarct with hemorrhage better appreciated on recently performed MR. Mass-effect upon the adjacent structures  including narrowing of the fourth ventricle and inferior aspect of the aqueduct has progressed slightly since the prior exam (series 201, image 10). Dilated lateral ventricles/ mild hydrocephalus similar to prior exam (best appreciated when compared to remote CT). CTA NECK Plaque carotid bifurcation / proximal internal carotid artery bilaterally with maximal 50% diameter stenosis proximal internal carotid artery bilaterally. Right vertebral artery  is slightly larger than left. No significant stenosis of the cervical segment of the vertebral arteries on either side. Small to slightly moderate-size bilateral pleural effusions with adjacent passive atelectasis. Endotracheal tube and nasogastric tube in place. CTA HEAD Mild narrowing irregularity M1 segment left middle cerebral artery. Mild narrowing right middle cerebral artery bifurcation. Calcified plaque with mild narrowing cavernous segment internal carotid artery bilaterally. Narrowing of the left vertebral artery from the foramen magnum to formation of the basilar artery. Poor delineation of the left posterior inferior cerebellar artery and appears occluded. No significant stenosis of the distal right vertebral artery. Mild irregularity of the basilar artery without significant stenosis. Irregular anterior inferior cerebellar arteries bilaterally. Fetal type contribution to formation of the left posterior cerebral artery. No high-grade stenosis of the posterior cerebral artery however, distal branch vessel narrowing and irregularity greater on the right. Electronically Signed   By: Lacy Duverney M.D.   On: 03/06/2015 08:04   Dg Chest Port 1 View  03/07/2015  CLINICAL DATA:  Pulmonary edema. EXAM: PORTABLE CHEST 1 VIEW COMPARISON:  03/06/2015. FINDINGS: Interim extubation and removal of NG tube. Cardiomegaly with persistent bilateral pulmonary infiltrates suggesting pulmonary edema. Slight interim clearing. Persistent low lung volumes with basilar atelectasis. No prominent pleural effusion or pneumothorax. IMPRESSION: 1. Interim removal of endotracheal tube and NG tube. 2. Cardiomegaly with persistent bilateral pulmonary infiltrates consistent with pulmonary edema. Slight improvement from prior exam. Persistent low lung volumes with mild basilar atelectasis. Electronically Signed   By: Maisie Fus  Register   On: 03/07/2015 07:24   Dg Chest Port 1 View  03/06/2015  CLINICAL DATA:  Pneumonia. EXAM: PORTABLE  CHEST 1 VIEW COMPARISON:  03/05/2015. FINDINGS: Endotracheal tube and NG tube in stable position. Cardiomegaly with diffuse bilateral pulmonary infiltrates again noted. Findings consistent with congestive heart failure. No change from prior exam. No prominent pleural effusion or pneumothorax . Degenerative changes thoracic spine. IMPRESSION: 1. Lines and tubes in stable position. 2. Congestive heart failure with bilateral pulmonary edema, no change from prior exam. Bilateral pneumonia cannot be excluded. Electronically Signed   By: Maisie Fus  Register   On: 03/06/2015 07:29    Assessment/Plan: Diagnosis: Left cerebellar hemorrhage with ataxia, gait disorder 1. Does the need for close, 24 hr/day medical supervision in concert with the patient's rehab needs make it unreasonable for this patient to be served in a less intensive setting? Yes 2. Co-Morbidities requiring supervision/potential complications: Pneumonia hypertension, subclavian thrombosis on left 3. Due to bladder management, bowel management, safety, skin/wound care, disease management, medication administration, pain management and patient education, does the patient require 24 hr/day rehab nursing? Yes 4. Does the patient require coordinated care of a physician, rehab nurse, PT (1-2 hrs/day, 5 days/week), OT (1-2 hrs/day, 5 days/week) and SLP (00.5-1 hrs/day, 5 days/week) to address physical and functional deficits in the context of the above medical diagnosis(es)? Yes Addressing deficits in the following areas: balance, endurance, locomotion, strength, transferring, bowel/bladder control, bathing, dressing, feeding, grooming, toileting, cognition and psychosocial support 5. Can the patient actively participate in an intensive therapy program of at least 3 hrs of therapy per day at least  5 days per week? Yes 6. The potential for patient to make measurable gains while on inpatient rehab is good 7. Anticipated functional outcomes upon discharge  from inpatient rehab are supervision  with PT, supervision with OT, supervision with SLP. 8. Estimated rehab length of stay to reach the above functional goals is: 14- 18 days 9. Does the patient have adequate social supports and living environment to accommodate these discharge functional goals? Potentially 10. Anticipated D/C setting: Home 11. Anticipated post D/C treatments: HH therapy 12. Overall Rehab/Functional Prognosis: good  RECOMMENDATIONS: This patient's condition is appropriate for continued rehabilitative care in the following setting: CIR Patient has agreed to participate in recommended program. Yes Note that insurance prior authorization may be required for reimbursement for recommended care.  Comment: Should be ready in 1-2 days, PT OT eval is pending    03/07/2015

## 2015-03-07 NOTE — Progress Notes (Signed)
PT Cancellation Note  Patient Details Name: Nancy Hahn MRN: [id number] DOB: 12-10-1931   Cancelled Treatment:    Reason Eval/Treat Not Completed: Medical issues which prohibited therapy.  Pt vomited while PT in room and systolic BP in 180's.  Will try back another time.     Venisha Boehning, Alison Murray 03/07/2015, 11:04 AM

## 2015-03-07 NOTE — Progress Notes (Signed)
[facility] ADULT ICU REPLACEMENT PROTOCOL FOR AM LAB REPLACEMENT ONLY  The patient does apply for the [facility] Adult ICU Electrolyte Replacment Protocol based on the criteria listed below:   1. Is GFR >/= 40 ml/min? Yes.    Patient's GFR today is >60 2. Is urine output >/= 0.5 ml/kg/hr for the last 6 hours? Yes.   Patient's UOP is 3.0 ml/kg/hr 3. Is BUN < 60 mg/dL? Yes.    Patient's BUN today is 13 4. Abnormal electrolyte(s): K3.5 5. Ordered repletion with: per protocol 6. If a panic level lab has been reported, has the CCM MD in charge been notified? Yes.  .   Physician:  Laural Benes, MD  Melrose Nakayama 03/07/2015 6:18 AM

## 2015-03-07 NOTE — Progress Notes (Signed)
    [facility] has been requested to perform a transesophageal echocardiogram on Nancy Hahn for stroke evaluation.  After careful review of history and examination, the risks and benefits of transesophageal echocardiogram have been explained including risks of esophageal damage, perforation (1:10,000 risk), bleeding, pharyngeal hematoma as well as other potential complications associated with conscious sedation including aspiration, arrhythmia, respiratory failure and death. Alternatives to treatment were discussed, questions were answered. Patient is willing to proceed.   Wilburt Finlay, [facility] 03/07/2015 6:19 PM

## 2015-03-07 NOTE — Progress Notes (Signed)
STROKE TEAM PROGRESS NOTE   HISTORY OF PRESENT ILLNESS Nancy Hahn is an 82 y.o. female with a history of a breast mass and hypertension who was admitted to [facility] on 02/26/2014 for acute left subclavian artery thrombosis. She underwent thrombectomy on 02/28/2015. Patient was on IV heparin initially and was changed to Eliquis. She became febrile overnight with a throat and coughing with chest x-ray findings consistent with aspiration pneumonia. She also developed a change in mental status and an MRI which is a large acute hemorrhagic left cerebellar infarction mass effect on fourth ventricle. A shunt had not developed hydrocephalus, however. She was intubated on mechanical ventilation and subsequently transferred to [facility] for further management. Reversal of anticoagulation with Eloquis was gone.  LSN: Unclear tPA Given: No: Acute hemorrhagic cerebral infarction in the setting of anticoagulation with Eliquis. mRankin:   SUBJECTIVE (INTERVAL HISTORY) She is neurologically stable.   family is  Not at bedside. She was extubated y`day and has done well. but is awake and following commands well.   OBJECTIVE Temp:  [97.8 F (36.6 C)-98.9 F (37.2 C)] 97.8 F (36.6 C) (01/25 1200) Pulse Rate:  [53-73] 65 (01/25 1200) Cardiac Rhythm:  [-] Normal sinus rhythm (01/25 1200) Resp:  [13-28] 13 (01/25 1200) BP: (129-179)/(65-90) 175/70 mmHg (01/25 1446) SpO2:  [97 %-100 %] 98 % (01/25 1200) Weight:  [193 lb 2 oz (87.6 kg)] 193 lb 2 oz (87.6 kg) (01/25 0800)  CBC:   Recent Labs Lab 03/06/15 0507 03/07/15 0446  WBC 6.1 8.7  NEUTROABS 4.8 7.2  HGB 10.0* 10.9*  HCT 29.9* 32.6*  MCV 88.7 89.8  PLT 212 299    Basic Metabolic Panel:   Recent Labs Lab 03/06/15 0507 03/07/15 0446  NA 141 140  K 3.0* 3.5  CL 106 97*  CO2 22 26  GLUCOSE 180* 149*  BUN 20 13  CREATININE 0.67 0.84  CALCIUM 8.3* 8.5*  MG 1.9 1.9  PHOS 2.7 4.0    Lipid Panel:     Component  Value Date/Time   CHOL 108 03/05/2015 0735   TRIG 104 03/06/2015 0507   HDL 22* 03/05/2015 0735   CHOLHDL 4.9 03/05/2015 0735   VLDL 30 03/05/2015 0735   LDLCALC 56 03/05/2015 0735   HgbA1c:  Lab Results  Component Value Date   HGBA1C 6.6* 02/27/2015   Urine Drug Screen: No results found for: LABOPIA, COCAINSCRNUR, LABBENZ, AMPHETMU, THCU, LABBARB    IMAGING  Dg Chest 1 View  03/03/2015   1. Patchy right basilar airspace opacity raises concern for pneumonia. The appearance is less typical for asymmetric pulmonary edema. Mild left perihilar opacity seen.  2. Underlying vascular congestion and mild cardiomegaly.    Dg Abd 1 View 03/03/2015   Enteric catheter with tip overlying the gastric body.      Mr Nancy Hahn Wo Contrast 03/03/2015   Large acute hemorrhagic left mid to inferior cerebellar infarct with associated mass effect compressing the fourth ventricle. Interval slight increase in size of ventricles compared to recent CT consistent with developing hydrocephalus. Left vertebral artery is at least partially patent. Limited assessment of the left posterior inferior cerebellar artery.      Portable Chest Xray 03/03/2015   1. Endotracheal tube seen ending 2-3 cm above the carina.  2. Bilateral central airspace opacity is perhaps slightly worsened and raises concern for pulmonary edema. Mild cardiomegaly.   Dg Chest Port 1 View 03/03/2015   1. Endotracheal tube tip in good position.  Enteric tube courses to the abdomen.  2. Radiographic progression of right lung base pneumonia and/or aspiration since earlier today. No pleural effusion identified.   PHYSICAL EXAM Frail elderly lady   . Afebrile. Head is nontraumatic. Neck is supple without bruit. Cardiac exam no murmur or gallop. Lungs are clear to auscultation. Distal pulses are well felt. Neurological Exam :  Awake and alert. PERRL. fundi not visualized. Vision acuity and fields seem adequate. Extraocular movements are full  range without nystagmus. Mild saccadic dysmetria on left gaze Face symmetric. Opens eyes to light stimulation.   Can follow simple commands, nods to questions. No gaze deviation. Does track. Conjugate gaze.No nystagmus. moves all extremities equally. Tongue midline. Cough and gag intact. No extremity drift or focal weakness. Mild left finger-to-nose dysmetria. Sensation appears intact bilaterally. Toes equivocal.  Gait deferred Gait: Unable to test      ASSESSMENT/PLAN [name] is a 82 y.o. female with history of breast mass, hypertension, recent aspiration pneumonia, left subclavian artery thrombosis s/p   thrombectomy on 02/28/2015 and subsequent Eliquis therapy, presenting with altered mental status. She did not receive IV t-PA due to hemorrhage.   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 source  Resultant  No focal motor deficits but unable to assess balance and walking due to intubation  MRI  - Large acute hemorrhagic left mid to inferior cerebellar infarct with associated mass effect.  MRA - not performed  Carotid Doppler - not indicated  2D Echo - EF 55-60%. No cardiac source of emboli identified.  LDL - not performed  HgbA1c not performed  VTE prophylaxis - SCDs  Diet regular Room service appropriate?: Yes; Fluid consistency:: Thin  aspirin 81 mg daily prior to admission, now on No antithrombotic secondary to hemorrhage.  Ongoing aggressive stroke risk factor management  Therapy recommendations: Pending  Disposition:  Pending  Hypertension  Blood pressure tends to run low   Other Stroke Risk Factors  Advanced age  Obesity, Body mass index is 29.37 kg/(m^2).    Other Active Problems  Anemia - monitor  Pneumonia - CCM following - on Zosyn and vancomycin - Day #3    Hospital day # 1   I personally examined patient and images, and have participated in and made any corrections needed to history,  physical, neuro exam,assessment and plan as stated above.  I have personally obtained the history, evaluated labs, reviewed imaging studies and agree with radiology interpretations. Patient was admitted to [facility] for acute subclavian artery thrombosis and thrombectomy, she was on IV heparin initially and was changed to Eliquis. She had a change in MS found to have hemorrhagic left cerebellar infarction with mass effect on fourth ventricle.  She was intubated on mechanical ventilation and subsequently transferred to [facility] for further management. Repeat CT head   was stable and she is neurologically stable. He'll transfer to the floor today. Recommend checking a TEE and loop recorder for etiology for her subclavian thrombosis.   Delia Heady, MD [facility] Pager: [phone] 05/05/2014 5:09 PM     To contact Stroke Continuity provider, please refer to [email or web address]. After hours, contact General Neurology

## 2015-03-08 ENCOUNTER — Encounter (HOSPITAL_COMMUNITY)
Admission: AD | Disposition: A | Discharge status: Skilled Nursing Facility | Payer: Self-pay | Source: Other Acute Inpatient Hospital | Attending: Internal Medicine

## 2015-03-08 ENCOUNTER — Encounter (HOSPITAL_COMMUNITY): Payer: Self-pay | Admitting: *Deleted

## 2015-03-08 ENCOUNTER — Other Ambulatory Visit: Payer: Self-pay | Admitting: Nurse Practitioner

## 2015-03-08 ENCOUNTER — Ambulatory Visit (HOSPITAL_COMMUNITY): Payer: Commercial Managed Care - HMO

## 2015-03-08 DIAGNOSIS — I10 Essential (primary) hypertension: Secondary | ICD-10-CM

## 2015-03-08 DIAGNOSIS — I639 Cerebral infarction, unspecified: Principal | ICD-10-CM

## 2015-03-08 LAB — CBC WITH DIFFERENTIAL/PLATELET
BASOS PCT: 0 %
Basophils Absolute: 0 10*3/uL (ref 0.0–0.1)
EOS ABS: 0.3 10*3/uL (ref 0.0–0.7)
EOS PCT: 3 %
HCT: 34.2 % — ABNORMAL LOW (ref 36.0–46.0)
Hemoglobin: 11.4 g/dL — ABNORMAL LOW (ref 12.0–15.0)
LYMPHS ABS: 1.2 10*3/uL (ref 0.7–4.0)
Lymphocytes Relative: 14 %
MCH: 29.8 pg (ref 26.0–34.0)
MCHC: 33.3 g/dL (ref 30.0–36.0)
MCV: 89.5 fL (ref 78.0–100.0)
MONOS PCT: 7 %
Monocytes Absolute: 0.6 10*3/uL (ref 0.1–1.0)
Neutro Abs: 6.3 10*3/uL (ref 1.7–7.7)
Neutrophils Relative %: 76 %
PLATELETS: 314 10*3/uL (ref 150–400)
RBC: 3.82 MIL/uL — ABNORMAL LOW (ref 3.87–5.11)
RDW: 13.2 % (ref 11.5–15.5)
WBC: 8.4 10*3/uL (ref 4.0–10.5)

## 2015-03-08 LAB — PHOSPHORUS: Phosphorus: 3.7 mg/dL (ref 2.5–4.6)

## 2015-03-08 LAB — BASIC METABOLIC PANEL
Anion gap: 8 (ref 5–15)
BUN: 22 mg/dL — AB (ref 6–20)
CALCIUM: 9 mg/dL (ref 8.9–10.3)
CO2: 27 mmol/L (ref 22–32)
CREATININE: 0.86 mg/dL (ref 0.44–1.00)
Chloride: 103 mmol/L (ref 101–111)
GFR calc Af Amer: >60 mL/min (ref >60)
Glucose, Bld: 122 mg/dL — ABNORMAL HIGH (ref 65–99)
Potassium: 3.5 mmol/L (ref 3.5–5.1)
SODIUM: 138 mmol/L (ref 135–145)

## 2015-03-08 LAB — GLUCOSE, CAPILLARY
GLUCOSE-CAPILLARY: 113 mg/dL — AB (ref 65–99)
Glucose-Capillary: 123 mg/dL — ABNORMAL HIGH (ref 65–99)

## 2015-03-08 LAB — MAGNESIUM: MAGNESIUM: 2.1 mg/dL (ref 1.7–2.4)

## 2015-03-08 LAB — BRAIN NATRIURETIC PEPTIDE: B NATRIURETIC PEPTIDE 5: 147.7 pg/mL — AB (ref 0.0–100.0)

## 2015-03-08 SURGERY — CANCELLED PROCEDURE

## 2015-03-08 SURGERY — LOOP RECORDER INSERTION
Anesthesia: LOCAL

## 2015-03-08 MED ORDER — INSULIN ASPART 100 UNIT/ML ~~LOC~~ SOLN
0.0000 [IU] | Freq: Three times a day (TID) | SUBCUTANEOUS | Status: DC
  Administered 2015-03-09: 2 [IU] via SUBCUTANEOUS
  Administered 2015-03-09 – 2015-03-11 (×7): 1 [IU] via SUBCUTANEOUS
  Administered 2015-03-13: 3 [IU] via SUBCUTANEOUS

## 2015-03-08 MED ORDER — ENSURE ENLIVE PO LIQD
237.0000 mL | Freq: Two times a day (BID) | ORAL | Status: DC
  Administered 2015-03-08 – 2015-03-13 (×5): 237 mL via ORAL
  Filled 2015-03-08 (×13): qty 237

## 2015-03-08 MED ORDER — POTASSIUM CHLORIDE CRYS ER 20 MEQ PO TBCR
40.0000 meq | EXTENDED_RELEASE_TABLET | Freq: Once | ORAL | Status: AC
Start: 2015-03-08 — End: 2015-03-08
  Administered 2015-03-08: 40 meq via ORAL
  Filled 2015-03-08: qty 2

## 2015-03-08 MED ORDER — HYDRALAZINE HCL 50 MG PO TABS
50.0000 mg | ORAL_TABLET | Freq: Three times a day (TID) | ORAL | Status: DC
  Administered 2015-03-08 – 2015-03-13 (×12): 50 mg via ORAL
  Filled 2015-03-08 (×12): qty 1

## 2015-03-08 MED ORDER — CARVEDILOL 6.25 MG PO TABS
6.2500 mg | ORAL_TABLET | Freq: Two times a day (BID) | ORAL | Status: DC
Start: 2015-03-08 — End: 2015-03-13
  Administered 2015-03-08 – 2015-03-13 (×11): 6.25 mg via ORAL
  Filled 2015-03-08 (×11): qty 1

## 2015-03-08 MED ORDER — FUROSEMIDE 10 MG/ML IJ SOLN
20.0000 mg | Freq: Once | INTRAMUSCULAR | Status: AC
  Administered 2015-03-08: 20 mg via INTRAVENOUS
  Filled 2015-03-08: qty 4

## 2015-03-08 MED ORDER — HYDRALAZINE HCL 20 MG/ML IJ SOLN
10.0000 mg | Freq: Four times a day (QID) | INTRAMUSCULAR | Status: DC | PRN
Start: 2015-03-08 — End: 2015-03-13

## 2015-03-08 NOTE — Progress Notes (Signed)
SLP Cancellation Note  Patient Details Name: Nancy Hahn MRN: [id number] DOB: 01-24-1932   Cancelled treatment:       Reason Eval/Treat Not Completed: Other (comment) (NPO for TEE. Will f/u. )  [name] MA, CCC-SLP [phone]  [name] 03/08/2015, 9:26 AM

## 2015-03-08 NOTE — Progress Notes (Signed)
TEE cancelled per Dr. Elease Hashimoto for elevated BP.

## 2015-03-08 NOTE — Progress Notes (Signed)
Nutrition Follow-up  DOCUMENTATION CODES:   Obesity unspecified  INTERVENTION:   Ensure Enlive po BID, each supplement provides 350 kcal and 20 grams of protein  NUTRITION DIAGNOSIS:   Inadequate oral intake related to poor appetite as evidenced by per patient/family report. Ongoing.   GOAL:   Patient will meet greater than or equal to 90% of their needs Not met.   MONITOR:   PO intake, Supplement acceptance, Labs, I & O's  ASSESSMENT:   82 y/o woman who was admitted to [facility] on 1/18 w/ cold and painful LUE. 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 She was treated with heparin and then transitioned to Elliquis. On the morning of 1/21 she was unable to maintain her balance, slurred speech, and L facial droop. An MRI was was obtained which showed a large cerebral stroke w/ evidence of hemorrhage and concern for developing hydrocephalous. Her function continued to decline and she was intubated.  1/23: extubated Sleeping soundly but did wake. Pt reports poor appetite. Willing to try ensure.  CBG: 150 BP control still an issue. Plan for d/c to SNF.   Diet Order:  Diet NPO time specified Diet regular Room service appropriate?: Yes; Fluid consistency:: Thin  Skin:  Reviewed, no issues  Last BM:  1/25  Height:   Ht Readings from Last 1 Encounters:  03/07/15 5' 8"  (1.727 m)   Weight:   Wt Readings from Last 1 Encounters:  03/08/15 185 lb 8 oz (84.142 kg)   Ideal Body Weight:  63.6 kg  BMI:  Body mass index is 28.21 kg/(m^2).  Estimated Nutritional Needs:   Kcal:  1600-1800  Protein:  80-90 grams  Fluid:  > 1.6 L/day  EDUCATION NEEDS:   No education needs identified at this time  [place], [place], [place] Pager [phone] After Hours Pager

## 2015-03-08 NOTE — Progress Notes (Signed)
      Pt was scheduled for TEE today for stroke. She has had an embolic CVA with hemorrhagic conversion. She is markedly hypertensive this am - SBP is 215.  I think she needs to be postponed until her BP is better.   Will cancel for today      Nahser, Deloris Ping, MD  03/08/2015 11:37 AM    [facility] [address] [place],  [address] [place], [place]  [address] Pager [phone] Phone: [phone]; Fax: [phone]

## 2015-03-08 NOTE — Care Management Important Message (Signed)
Important Message  Patient Details  Name: Nancy Hahn MRN: [id number] Date of Birth: [birth year]   Medicare Important Message Given:  Yes    Oralia Rud Xitlally Mooneyham 03/08/2015, 2:32 PM

## 2015-03-08 NOTE — Progress Notes (Signed)
I met with pt at bedside and then contacted her sister by phone, Hillery Hunter, with her permission. Pt lives alone with her nephew, Roselyn Reef, living next door. She does not have 24/7 supervision available to her at home at this time which Dr. Letta Pate feels she will need after an inpt rehab stay. Pt has been to [facility] last year after she broke her foot and then eventually returned home alone and was independent. Sister prefers for pt to receive her rehab at [facility] in [place] until she becomes independent enough to return home alone with intermittent assist of family. Pt has stated that she will do whatever is needed. I will update RN CM and SW of need for SNF rehab in [place]. We will sign off at this time. [phone]

## 2015-03-08 NOTE — Progress Notes (Signed)
PATIENT DETAILS Name: Nancy Hahn Age: 82 y.o. Sex: female Date of Birth: [birth year] Admit Date: 03/03/2015 Admitting Physician Kalman Shan, MD [id number], Lavada Mesi, MD  Brief narrative 82 y/o woman who was admitted to [facility] on 1/18 w/ cold and painful LUE. 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 She was treated with heparin and then transitioned to Elliquis. On the morning of 1/21 she was unable to maintain her balance, slurred speech, and L facial droop. An MRI was was obtained which showed a large cerebral stroke w/ evidence of hemorrhage and concern for developing hydrocephalous. She was subsequently intubated, and transferred to [facility]. Once stabilized/extubated, transferred to [facility] hospitalist service on 1/26.   Subjective: Awake, alert, denies any complaints.  Assessment/Plan: Active Problems:  Acute ischemic stroke with hemorrhagic conversion: Improving, no focal deficits seen on exam. MRI brain showed a Large acute hemorrhagic left mid to inferior cerebellar infarct with associated mass effect. CTA neck showed approximately 50% stenosis bilaterally. TTE 1/15 EF 55%, no obvious embolic source. Neurology recommends TEE and a loop recorder. Currently not on any antiplatelets given hemorrhagic conversion. LDL 56, A1c 6.6. Initially plans were to discharge to CIR, however upon further evaluation felt more appropriate for SNF.  Acute respiratory failure with hypoxia: Secondary to CVA, healthcare associated pneumonia. Required intubation, subsequently extubated on 1/23- now doing well on room air.  Healthcare associated pneumonia: Afebrile, improved. Has completed a course of antibiotics. Continue to monitor off antibiotics.  Acute diastolic heart failure: Compensated, continue Lasix. Recheck chest x-ray, BNP in a.m.  Hypertension: Uncontrolled, spoke with Dr. Pearlean Brownie, okay to start lowering blood  pressure more. Continue hydralazine-but increase to 50 mg 3 times a day, and Coreg 6.25 mg twice a day. Follow and adjust accordingly  Ischemic left upper extremity: Secondary to acute left subclavian artery thrombosis-underwent status post embolectomy on 02/27/14. Was on anticoagulation with IV heparin, which was then changed to Eliquis. Unfortunately had a large ischemic stroke with hemorrhagic conversion, subsequently off all anticoagulation at present. Plans are to proceed with TEE, and a loop recorder. Etiology likely paroxysmal atrial fibrillation.  Dyslipidemia: LDL 56, continue statin  Type 2 diabetes: Continue with SSI, suspect may require initiation of metformin on discharge.  Disposition: Remain inpatient  Antimicrobial agents  See below  Anti-infectives    Start     Dose/Rate Route Frequency Ordered Stop   03/05/15 1000  levofloxacin (LEVAQUIN) IVPB 500 mg  Status:  Discontinued     500 mg 100 mL/hr over 60 Minutes Intravenous Every 24 hours 03/04/15 0203 03/04/15 0820   03/04/15 2200  vancomycin (VANCOCIN) IVPB 1000 mg/200 mL premix  Status:  Discontinued     1,000 mg 200 mL/hr over 60 Minutes Intravenous Every 12 hours 03/04/15 0846 03/06/15 1140   03/04/15 1000  piperacillin-tazobactam (ZOSYN) IVPB 3.375 g  Status:  Discontinued     3.375 g 12.5 mL/hr over 240 Minutes Intravenous Every 8 hours 03/04/15 0846 03/06/15 1140   03/04/15 1000  vancomycin (VANCOCIN) 1,500 mg in sodium chloride 0.9 % 500 mL IVPB     1,500 mg 250 mL/hr over 120 Minutes Intravenous  Once 03/04/15 0846 03/04/15 1200      DVT Prophylaxis: SCD's  Code Status: Full code   Family Communication None at bedside  Procedures: None  CONSULTS:  cardiology, pulmonary/intensive care and neurology  Time spent  30 minutes-Greater than 50% of this time was spent in counseling, explanation of diagnosis, planning of further management, and coordination of care.  MEDICATIONS: Scheduled Meds: .  carvedilol  6.25 mg Oral BID WC  . hydrALAZINE  50 mg Oral 3 times per day  . simvastatin  20 mg Oral Daily   Continuous Infusions:  PRN Meds:.sodium chloride, hydrALAZINE, ondansetron (ZOFRAN) IV    PHYSICAL EXAM: Vital signs in last 24 hours: Filed Vitals:   03/08/15 0523 03/08/15 0941 03/08/15 1008 03/08/15 1302  BP: 174/73 187/76 202/78 195/73  Pulse: 63 62 62 59  Temp: 97.8 F (36.6 C) 97.8 F (36.6 C) 98.3 F (36.8 C) 97.8 F (36.6 C)  TempSrc: Oral Oral Oral Oral  Resp: Height:      Weight:      SpO2: 95% 96% 94% 96%    Weight change: 0 kg (0 lb) Filed Weights   03/07/15 0500 03/07/15 0800 03/08/15 0427  Weight: 87.6 kg (193 lb 2 oz) 87.6 kg (193 lb 2 oz) 84.142 kg (185 lb 8 oz)   Body mass index is 28.21 kg/(m^2).   Gen Exam: Awake and alert with clear speech.   Neck: Supple, No JVD.   Chest: B/L Clear.   CVS: S1 S2 Regular, no murmurs.  Abdomen: soft, BS +, non tender, non distended.  Extremities: no edema, lower extremities warm to touch Neurologic: Non Focal.   Skin: No Rash.   Wounds: N/A.    Intake/Output from previous day:  Intake/Output Summary (Last 24 hours) at 03/08/15 1409 Last data filed at 03/07/15 2054  Gross per 24 hour  Intake      0 ml  Output    126 ml  Net   -126 ml     LAB RESULTS: CBC  Recent Labs Lab 03/03/15 2300 03/05/15 0055 03/06/15 0507 03/07/15 0446 03/08/15 0657  WBC 9.7 7.2 6.1 8.7 8.4  HGB 9.0* 9.9* 10.0* 10.9* 11.4*  HCT 27.3* 28.3* 29.9* 32.6* 34.2*  PLT 152 138* 212 299 314  MCV 91.3 89.3 88.7 89.8 89.5  MCH 30.1 31.2 29.7 30.0 29.8  MCHC 33.0 35.0 33.4 33.4 33.3  RDW 13.6 13.3 13.4 13.4 13.2  LYMPHSABS 0.8 1.0 0.8 0.6* 1.2  MONOABS 0.4 0.3 0.3 0.6 0.6  EOSABS 0.0 0.3 0.2 0.3 0.3  BASOSABS 0.0 0.0 0.0 0.0 0.0    Chemistries   Recent Labs Lab 03/03/15 2300 03/05/15 0055 03/05/15 2020 03/06/15 0507 03/07/15 0446 03/08/15 0657  NA 141 141 142 141 140 138  K 3.8 5.4* 3.9 3.0*  3.5 3.5  CL 109 108 106 106 97* 103  CO2 23 21* GLUCOSE 141* 119* 160* 180* 149* 122*  BUN 20 24* 22* 20 13 22*  CREATININE 0.85 0.84 0.83 0.67 0.84 0.86  CALCIUM 8.2* 8.0* 8.2* 8.3* 8.5* 9.0  MG 1.9 2.5*  --  1.9 1.9 2.1    CBG:  Recent Labs Lab 03/05/15 1544 03/05/15 2019 03/05/15 2356 03/06/15 0355 03/06/15 0742  GLUCAP 147* 140* 160* 156* 150*    GFR Estimated Creatinine Clearance: 58.3 mL/min (by C-G formula based on Cr of 0.86).  Coagulation profile  Recent Labs Lab 03/03/15 2300  INR 1.66*    Cardiac Enzymes No results for input(s): CKMB, TROPONINI, MYOGLOBIN in the last 168 hours.  Invalid input(s): CK  Invalid input(s): POCBNP No results for input(s): DDIMER in the last 72 hours. No results for input(s): HGBA1C in  the last 72 hours.  Recent Labs  03/06/15 0507  TRIG 104   No results for input(s): TSH, T4TOTAL, T3FREE, THYROIDAB in the last 72 hours.  Invalid input(s): FREET3 No results for input(s): VITAMINB12, FOLATE, FERRITIN, TIBC, IRON, RETICCTPCT in the last 72 hours. No results for input(s): LIPASE, AMYLASE in the last 72 hours.  Urine Studies No results for input(s): UHGB, CRYS in the last 72 hours.  Invalid input(s): UACOL, UAPR, USPG, UPH, UTP, UGL, UKET, UBIL, UNIT, UROB, ULEU, UEPI, UWBC, URBC, UBAC, CAST, UCOM, BILUA  MICROBIOLOGY: Recent Results (from the past 240 hour(s))  MRSA PCR Screening     Status: None   Collection Time: 03/03/15 11:01 PM  Result Value Ref Range Status   MRSA by PCR NEGATIVE NEGATIVE Final    Comment:        The GeneXpert MRSA Assay (FDA approved for NASAL specimens only), is one component of a comprehensive MRSA colonization surveillance program. It is not intended to diagnose MRSA infection nor to guide or monitor treatment for MRSA infections.   Culture, respiratory (NON-Expectorated)     Status: None   Collection Time: 03/04/15  8:31 AM  Result Value Ref Range Status   Specimen  Description TRACHEAL ASPIRATE  Final   Special Requests Normal  Final   Gram Stain   Final    ABUNDANT WBC PRESENT, PREDOMINANTLY PMN NO SQUAMOUS EPITHELIAL CELLS SEEN NO ORGANISMS SEEN Performed at [facility]    Culture   Final    FEW YEAST CONSISTENT WITH CANDIDA SPECIES Performed at [facility]    Report Status 03/06/2015 FINAL  Final    RADIOLOGY STUDIES/RESULTS: Ct Angio Head W/cm &/or Wo Cm  03/06/2015  CLINICAL DATA:  82 year old female with off balance, slurred speech, altered mental status and left facial droop. Left cerebellar infarct. Subsequent encounter. EXAM: CT ANGIOGRAPHY HEAD AND NECK TECHNIQUE: Multidetector CT imaging of the head and neck was performed using the standard protocol during bolus administration of intravenous contrast. Multiplanar CT image reconstructions and MIPs were obtained to evaluate the vascular anatomy. Carotid stenosis measurements (when applicable) are obtained utilizing NASCET criteria, using the distal internal carotid diameter as the denominator. CONTRAST:  80mL OMNIPAQUE IOHEXOL 350 MG/ML SOLN COMPARISON:  03/04/2015 and 02/26/2014 head CT. 03/03/2015 brain MR. FINDINGS: CT HEAD Brain: Large left cerebellar infarct with hemorrhage better appreciated on recently performed MR. Mass-effect upon the adjacent structures including narrowing of the fourth ventricle and inferior aspect of the aqueduct has progressed slightly since the prior exam (series 201, image 10). Dilated lateral ventricles/ mild hydrocephalus similar to prior exam (best appreciated when compared to remote CT). Calvarium and skull base: Negative. Paranasal sinuses: Minimal mucosal thickening maxillary sinuses greatest inferior right maxillary sinus with slight polypoid opacification. Orbits: Mild exophthalmos. CTA NECK Aortic arch: 3 vessel arch. Right carotid system: Plaque right carotid bifurcation proximal right internal carotid artery with maximal 50% diameter  stenosis proximal right internal carotid artery. Left carotid system: Plaque left carotid bifurcation proximal left internal carotid artery with maximal 50% diameter stenosis proximal left internal carotid artery. Vertebral arteries:Right vertebral artery is slightly larger than left. No significant stenosis of the cervical segment of the vertebral arteries on either side. Plaque with mild narrowing right subclavian artery. Skeleton: Cervical spondylotic changes most notable C6-7. Other neck: No worrisome primary neck mass. Small to slightly moderate-size bilateral pleural effusions with adjacent passive atelectasis. Endotracheal tube and nasogastric tube in place. CTA HEAD Anterior circulation: Mild narrowing irregularity M1  segment left middle cerebral artery. Mild narrowing right middle cerebral artery bifurcation. Calcified plaque with mild narrowing cavernous segment internal carotid artery bilaterally. Posterior circulation: Narrowing of the left vertebral artery from the foramen magnum to formation of the basilar artery. Poor delineation of the left posterior inferior cerebellar artery. No significant stenosis of the distal right vertebral artery. Mild irregularity of the basilar artery without significant stenosis. Irregular anterior inferior cerebellar arteries bilaterally. Fetal type contribution to formation of the left posterior cerebral artery. No high-grade stenosis of the posterior cerebral artery however, distal branch vessel narrowing and irregularity greater on the right. No aneurysm noted. Venous sinuses: Patent. Anatomic variants: As above. Delayed phase: As above. IMPRESSION: CT HEAD Large left cerebellar infarct with hemorrhage better appreciated on recently performed MR. Mass-effect upon the adjacent structures including narrowing of the fourth ventricle and inferior aspect of the aqueduct has progressed slightly since the prior exam (series 201, image 10). Dilated lateral ventricles/ mild  hydrocephalus similar to prior exam (best appreciated when compared to remote CT). CTA NECK Plaque carotid bifurcation / proximal internal carotid artery bilaterally with maximal 50% diameter stenosis proximal internal carotid artery bilaterally. Right vertebral artery is slightly larger than left. No significant stenosis of the cervical segment of the vertebral arteries on either side. Small to slightly moderate-size bilateral pleural effusions with adjacent passive atelectasis. Endotracheal tube and nasogastric tube in place. CTA HEAD Mild narrowing irregularity M1 segment left middle cerebral artery. Mild narrowing right middle cerebral artery bifurcation. Calcified plaque with mild narrowing cavernous segment internal carotid artery bilaterally. Narrowing of the left vertebral artery from the foramen magnum to formation of the basilar artery. Poor delineation of the left posterior inferior cerebellar artery and appears occluded. No significant stenosis of the distal right vertebral artery. Mild irregularity of the basilar artery without significant stenosis. Irregular anterior inferior cerebellar arteries bilaterally. Fetal type contribution to formation of the left posterior cerebral artery. No high-grade stenosis of the posterior cerebral artery however, distal branch vessel narrowing and irregularity greater on the right. Electronically Signed   By: Lacy Duverney M.D.   On: 03/06/2015 08:04   Dg Chest 1 View  03/03/2015  CLINICAL DATA:  Acute onset of hypoxia. Cough and shortness of breath. Insert macro EXAM: CHEST 1 VIEW COMPARISON:  Chest radiograph performed 02/27/2015 FINDINGS: The lungs are well-aerated. Patchy right basilar airspace opacity raises concern for pneumonia. The appearance is less typical for asymmetric pulmonary edema. Underlying vascular congestion is noted. Mild left perihilar opacity is seen. No pleural effusion or pneumothorax is identified. The cardiomediastinal silhouette is mildly  enlarged. No acute osseous abnormalities are seen. IMPRESSION: 1. Patchy right basilar airspace opacity raises concern for pneumonia. The appearance is less typical for asymmetric pulmonary edema. Mild left perihilar opacity seen. 2. Underlying vascular congestion and mild cardiomegaly. Electronically Signed   By: Roanna Raider M.D.   On: 03/03/2015 01:06   Dg Chest 2 View  02/27/2015  CLINICAL DATA:  Acute onset left arm pain at 4:30 p.m. today. Initial encounter. EXAM: CHEST  2 VIEW COMPARISON:  None. FINDINGS: Calcified granuloma on the right is noted. The lungs are otherwise clear. Heart size is normal. No pneumothorax or pleural effusion. Thoracic spondylosis is identified. IMPRESSION: No acute abnormality. Electronically Signed   By: Drusilla Kanner M.D.   On: 02/27/2015 20:18   Dg Abd 1 View  03/03/2015  CLINICAL DATA:  Status post enteric catheter placement. EXAM: ABDOMEN - 1 VIEW COMPARISON:  None. FINDINGS: Enteric catheter  seen with tip overlying gastric body. The bowel gas pattern is normal. No radiographic evidence of organomegaly. No radio-opaque calculi or other significant radiographic abnormality are seen. IMPRESSION: Enteric catheter with tip overlying the gastric body. Electronically Signed   By: Ted Mcalpine M.D.   On: 03/03/2015 19:05   Ct Head Wo Contrast  03/04/2015  CLINICAL DATA:  Known left cerebellar infarct, evaluate for hydrocephalus EXAM: CT HEAD WITHOUT CONTRAST TECHNIQUE: Contiguous axial images were obtained from the base of the skull through the vertex without intravenous contrast. COMPARISON:  03/03/2015 FINDINGS: Bony calvarium is intact. There are again noted changes consistent with a left cerebellar infarct with mass-effect upon the fourth ventricle and pons. The lateral ventricles arm more prominent than that seen on a prior exam from 02/26/2014 but relatively stable from the recent MRI examination from the previous day. No acute hemorrhage is identified. No  other focal abnormality is seen. IMPRESSION: Left cerebellar infarct with evidence of mild ventricular dilatation stable from the previous day. Electronically Signed   By: Alcide Clever M.D.   On: 03/04/2015 09:07   Ct Angio Neck W/cm &/or Wo/cm  03/06/2015  CLINICAL DATA:  82 year old female with off balance, slurred speech, altered mental status and left facial droop. Left cerebellar infarct. Subsequent encounter. EXAM: CT ANGIOGRAPHY HEAD AND NECK TECHNIQUE: Multidetector CT imaging of the head and neck was performed using the standard protocol during bolus administration of intravenous contrast. Multiplanar CT image reconstructions and MIPs were obtained to evaluate the vascular anatomy. Carotid stenosis measurements (when applicable) are obtained utilizing NASCET criteria, using the distal internal carotid diameter as the denominator. CONTRAST:  80mL OMNIPAQUE IOHEXOL 350 MG/ML SOLN COMPARISON:  03/04/2015 and 02/26/2014 head CT. 03/03/2015 brain MR. FINDINGS: CT HEAD Brain: Large left cerebellar infarct with hemorrhage better appreciated on recently performed MR. Mass-effect upon the adjacent structures including narrowing of the fourth ventricle and inferior aspect of the aqueduct has progressed slightly since the prior exam (series 201, image 10). Dilated lateral ventricles/ mild hydrocephalus similar to prior exam (best appreciated when compared to remote CT). Calvarium and skull base: Negative. Paranasal sinuses: Minimal mucosal thickening maxillary sinuses greatest inferior right maxillary sinus with slight polypoid opacification. Orbits: Mild exophthalmos. CTA NECK Aortic arch: 3 vessel arch. Right carotid system: Plaque right carotid bifurcation proximal right internal carotid artery with maximal 50% diameter stenosis proximal right internal carotid artery. Left carotid system: Plaque left carotid bifurcation proximal left internal carotid artery with maximal 50% diameter stenosis proximal left  internal carotid artery. Vertebral arteries:Right vertebral artery is slightly larger than left. No significant stenosis of the cervical segment of the vertebral arteries on either side. Plaque with mild narrowing right subclavian artery. Skeleton: Cervical spondylotic changes most notable C6-7. Other neck: No worrisome primary neck mass. Small to slightly moderate-size bilateral pleural effusions with adjacent passive atelectasis. Endotracheal tube and nasogastric tube in place. CTA HEAD Anterior circulation: Mild narrowing irregularity M1 segment left middle cerebral artery. Mild narrowing right middle cerebral artery bifurcation. Calcified plaque with mild narrowing cavernous segment internal carotid artery bilaterally. Posterior circulation: Narrowing of the left vertebral artery from the foramen magnum to formation of the basilar artery. Poor delineation of the left posterior inferior cerebellar artery. No significant stenosis of the distal right vertebral artery. Mild irregularity of the basilar artery without significant stenosis. Irregular anterior inferior cerebellar arteries bilaterally. Fetal type contribution to formation of the left posterior cerebral artery. No high-grade stenosis of the posterior cerebral artery however, distal branch vessel  narrowing and irregularity greater on the right. No aneurysm noted. Venous sinuses: Patent. Anatomic variants: As above. Delayed phase: As above. IMPRESSION: CT HEAD Large left cerebellar infarct with hemorrhage better appreciated on recently performed MR. Mass-effect upon the adjacent structures including narrowing of the fourth ventricle and inferior aspect of the aqueduct has progressed slightly since the prior exam (series 201, image 10). Dilated lateral ventricles/ mild hydrocephalus similar to prior exam (best appreciated when compared to remote CT). CTA NECK Plaque carotid bifurcation / proximal internal carotid artery bilaterally with maximal 50% diameter  stenosis proximal internal carotid artery bilaterally. Right vertebral artery is slightly larger than left. No significant stenosis of the cervical segment of the vertebral arteries on either side. Small to slightly moderate-size bilateral pleural effusions with adjacent passive atelectasis. Endotracheal tube and nasogastric tube in place. CTA HEAD Mild narrowing irregularity M1 segment left middle cerebral artery. Mild narrowing right middle cerebral artery bifurcation. Calcified plaque with mild narrowing cavernous segment internal carotid artery bilaterally. Narrowing of the left vertebral artery from the foramen magnum to formation of the basilar artery. Poor delineation of the left posterior inferior cerebellar artery and appears occluded. No significant stenosis of the distal right vertebral artery. Mild irregularity of the basilar artery without significant stenosis. Irregular anterior inferior cerebellar arteries bilaterally. Fetal type contribution to formation of the left posterior cerebral artery. No high-grade stenosis of the posterior cerebral artery however, distal branch vessel narrowing and irregularity greater on the right. Electronically Signed   By: Lacy Duverney M.D.   On: 03/06/2015 08:04   Ct Angio Up Extrem Left W/cm &/or Wo/cm  02/27/2015  CLINICAL DATA:  82 year old female with left arm pain and heaviness. EXAM: CT ANGIOGRAPHY UPPER LEFT EXTREMITY TECHNIQUE: Transaxial CT of the left upper extremity from the level of the clavicle technique timed was performed in 3 mm slice thickness. Coronal and sagittal reformatted images were provided. The study was performed following intravenous administration of 75 cc Omnipaque 350 CONTRAST:  75mL OMNIPAQUE IOHEXOL 350 MG/ML SOLN COMPARISON:  Go technique FINDINGS: Evaluation of the distal extremity vasculature is limited due to streak artifact caused by patient's body. The aortic arch and the origins of the great vessels appear unremarkable. There  is segmental wall thickening with luminal narrowing of the left subclavian artery extending into the left axillary artery and proximal brachial artery. There is high-grade narrowing of the subclavian and axillary segment of the vessel. The distal brachial artery is patent. There is bifurcation of the brachial artery approximately 4.5 cm distal to the elbow. The proximal portion of the radial and ulnar artery appear patent. Evaluation of the distal portion of this vessel is very limited due to suboptimal opacification. There is however apparent diminished flow within the distal radial and ulna are artery concerning for underlying vascular disease. Correlation with clinical exam and duplex ultrasound recommended. The soft tissues of the left upper extremity appear unremarkable. There is no left axillary adenopathy. Linear left lung base atelectatic changes/scarring noted. A 13 mm indeterminate left adrenal nodule. Multiple left renal hypodense lesions, incompletely characterized, possibly parenchymal and parapelvic cysts. Ultrasound may provide better evaluation. There is diverticulosis of the visualized colon. No acute fracture. Review of the MIP images confirms the above findings. IMPRESSION: Segmental narrowing of the left subclavian, axillary, and proximal brachial arteries. Limited evaluation of the mid and distal portion of the radial and ulna arteries due to suboptimal opacification concerning for diminished flow related to underlying peripheral vascular disease. Correlation with clinical exam  and further evaluation with duplex ultrasound recommended. Electronically Signed   By: Elgie Collard M.D.   On: 02/27/2015 23:55   Mr Laqueta Jean ZO Contrast  03/03/2015  CLINICAL DATA:  82 year old hypertensive female with altered mental status and facial weakness. Subsequent encounter. EXAM: MRI HEAD WITHOUT AND WITH CONTRAST TECHNIQUE: Multiplanar, multiecho pulse sequences of the brain and surrounding structures  were obtained without and with intravenous contrast. CONTRAST:  18mL MULTIHANCE GADOBENATE DIMEGLUMINE 529 MG/ML IV SOLN COMPARISON:  02/26/2014. 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 Interval slight increase in size of ventricles compared to recent CT consistent with developing hydrocephalus. Patient is un safe for lumbar puncture. Mild small vessel disease changes. No intracranial mass separate from above described findings. Fast technique imaging had to be utilized. Left vertebral artery is at least partially patent. Right vertebral artery, basilar artery and internal carotid arteries are patent. Limited assessment of the left posterior inferior cerebellar artery. 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 Interval slight increase in size of ventricles compared to recent CT consistent with developing hydrocephalus. Left vertebral artery is at least partially patent. Limited assessment of the left posterior inferior cerebellar artery. These results will be called to the ordering clinician or representative by the Radiologist Assistant, and communication documented in the PACS or zVision Dashboard. Electronically Signed   By: Lacy Duverney M.D.   On: 03/03/2015 16:55   Dg Chest Port 1 View  03/07/2015  CLINICAL DATA:  Pulmonary edema. EXAM: PORTABLE CHEST 1 VIEW COMPARISON:  03/06/2015. FINDINGS: Interim extubation and removal of NG tube. Cardiomegaly with persistent bilateral pulmonary infiltrates suggesting pulmonary edema. Slight interim clearing. Persistent low lung volumes with basilar atelectasis. No prominent pleural effusion or pneumothorax. IMPRESSION: 1. Interim removal of endotracheal tube and NG tube. 2. Cardiomegaly with persistent bilateral pulmonary infiltrates consistent with pulmonary edema. Slight  improvement from prior exam. Persistent low lung volumes with mild basilar atelectasis. Electronically Signed   By: Maisie Fus  Register   On: 03/07/2015 07:24   Dg Chest Port 1 View  03/06/2015  CLINICAL DATA:  Pneumonia. EXAM: PORTABLE CHEST 1 VIEW COMPARISON:  03/05/2015. FINDINGS: Endotracheal tube and NG tube in stable position. Cardiomegaly with diffuse bilateral pulmonary infiltrates again noted. Findings consistent with congestive heart failure. No change from prior exam. No prominent pleural effusion or pneumothorax . Degenerative changes thoracic spine. IMPRESSION: 1. Lines and tubes in stable position. 2. Congestive heart failure with bilateral pulmonary edema, no change from prior exam. Bilateral pneumonia cannot be excluded. Electronically Signed   By: Maisie Fus  Register   On: 03/06/2015 07:29   Dg Chest Port 1 View  03/05/2015  CLINICAL DATA:  Intubation. EXAM: PORTABLE CHEST 1 VIEW COMPARISON:  03/03/2015. FINDINGS: Endotracheal tube and NG tube in stable position. Cardiomegaly with diffuse bilateral pulmonary alveolar infiltrates, improved from prior exam. No pleural effusion or pneumothorax. IMPRESSION: 1. Lines and tubes in stable position. 2. Cardiomegaly with bilateral pulmonary infiltrates consistent with pulmonary edema. Interim improvement from prior exam. Electronically Signed   By: Maisie Fus  Register   On: 03/05/2015 07:14   Portable Chest Xray  03/03/2015  CLINICAL DATA:  Admission chest radiograph. Known hemorrhagic infarct. Initial encounter. EXAM: PORTABLE CHEST 1 VIEW COMPARISON:  Chest radiograph performed earlier today at 6:39 p.m. FINDINGS: The patient's endotracheal tube is seen ending 2-3 cm above the carina. An enteric tube is noted  extending below the diaphragm. Bilateral central airspace opacity raises concern for pulmonary edema. No pleural effusion or pneumothorax is seen. The cardiomediastinal silhouette is mildly enlarged. No acute osseous abnormalities are identified.  IMPRESSION: 1. Endotracheal tube seen ending 2-3 cm above the carina. 2. Bilateral central airspace opacity is perhaps slightly worsened and raises concern for pulmonary edema. Mild cardiomegaly. Electronically Signed   By: Roanna Raider M.D.   On: 03/03/2015 23:00   Dg Chest Port 1 View  03/03/2015  CLINICAL DATA:  82 year old female being transferred to [facility] from [facility] , intubated and sedated now. Cough and shortness of breath. Initial encounter. EXAM: PORTABLE CHEST 1 VIEW COMPARISON:  [facility] 0022 hours today. FINDINGS: Portable AP semi upright view at 1839 hours. Endotracheal tube tip in good position between the level the clavicles and carina. Enteric tube courses to the left upper quadrant, tip not included. Interval worsening confluence of right lung base opacity, favor lower lobe. Upper lobes appear stable. Left lung base is stable and probably unaffected. No superimposed pneumothorax or pulmonary edema. No pleural effusion identified. IMPRESSION: 1. Endotracheal tube tip in good position. Enteric tube courses to the abdomen. 2. Radiographic progression of right lung base pneumonia and/or aspiration since earlier today. No pleural effusion identified. Electronically Signed   By: Odessa Fleming M.D.   On: 03/03/2015 19:04    Jeoffrey Massed, MD  [facility] Pager:[phone]  If 7PM-7AM, please contact night-coverage [email or web address] Password [email or web address] 03/08/2015, 2:09 PM   LOS: 5 days

## 2015-03-08 NOTE — Consult Note (Signed)
ELECTROPHYSIOLOGY CONSULT NOTE  Patient ID: Nancy Hahn MRN: [id number], DOB/AGE: 80-Mar-1933   Admit date: 03/03/2015 Date of Consult: 03/08/2015  Primary Physician: Rozanna Box, MD Primary Cardiologist: new to [facility] Reason for Consultation: Cryptogenic stroke; recommendations regarding Implantable Loop Recorder  History of Present Illness Nancy Hahn was admitted on 02/27/15 with acute left subclavian artery thrombus.  She underwent thrombectomy at [facility] on 02/28/15.  She was initially on IV Heparin and then changed to Eliquis. She developed aspiration pneumonia as well as mental status changes post procedure.  MRI was performed which demonstrated large acute hemorrhagic left cerebellar infarction. She was transferred to [facility] for further evaluation. She required intubation for a short while. She has been extubated and plan for TEE today to look for source of subclavian thrombus.    She has undergone workup for stroke including echocardiogram and carotid dopplers.  The patient has been monitored on telemetry which has demonstrated sinus rhythm with runs of atrial tachycardia.  Inpatient stroke work-up is to be completed with a TEE.   Echocardiogram this admission demonstrated EF 55-60%, grade 2 diastolic dysfunction, LA 37.  Lab work is reviewed.  Prior to admission, the patient denies chest pain, shortness of breath, dizziness, palpitations, or syncope.  They are recovering from their stroke with plans to go to rehab at discharge.  EP has been asked to evaluate for placement of an implantable loop recorder to monitor for atrial fibrillation.   Past Medical History  Diagnosis Date  . Breast mass   . Hypertension      Surgical History:  Past Surgical History  Procedure Laterality Date  . Breast surgery  1980    biopsy  . Dilation and curettage of uterus    . Foot surgery    . Peripheral vascular catheterization Left 02/28/2015    Procedure: Upper Extremity  Angiography;  Surgeon: Renford Dills, MD;  Location: [facility] INVASIVE CV LAB;  Service: Cardiovascular;  Laterality: Left;  . Peripheral vascular catheterization  02/28/2015    Procedure: Upper Extremity Intervention;  Surgeon: Renford Dills, MD;  Location: [facility] INVASIVE CV LAB;  Service: Cardiovascular;;  . Embolectomy Left 02/28/2015    Procedure: EMBOLECTOMY ;  Surgeon: Renford Dills, MD;  Location: [facility] ORS;  Service: Vascular;  Laterality: Left;     Prescriptions prior to admission  Medication Sig Dispense Refill Last Dose  . aspirin 81 MG tablet Take 81 mg by mouth daily.   Past Week at Unknown time  . atenolol (TENORMIN) 50 MG tablet Take 50 mg by mouth daily.   Past Week at 0700  . lisinopril (PRINIVIL,ZESTRIL) 40 MG tablet Take 40 mg by mouth daily.   Past Week at Unknown time  . NIFEdipine (PROCARDIA XL/ADALAT-CC) 90 MG 24 hr tablet Take 90 mg by mouth daily.   Past Week at Unknown time  . simvastatin (ZOCOR) 20 MG tablet Take 20 mg by mouth daily.   Past Week at Unknown time    Inpatient Medications:  . hydrALAZINE  25 mg Oral 3 times per day  . simvastatin  20 mg Oral Daily    Allergies: No Known Allergies  Social History   Social History  . Marital Status: Widowed    Spouse Name: N/A  . Number of Children: N/A  . Years of Education: N/A   Occupational History  . Not on file.   Social History Main Topics  . Smoking status: Never Smoker   . Smokeless tobacco:  Never Used  . Alcohol Use: No  . Drug Use: No  . Sexual Activity: Not on file   Other Topics Concern  . Not on file   Social History Narrative     Family History  Problem Relation Age of Onset  . Emphysema Father   . Cancer Brother     lung  . Cancer Sister     breast  . Cancer Maternal Aunt     ovarian  . Diabetes type II Mother   . Diabetes type II Sister     x3      Review of Systems: All other systems reviewed and are otherwise negative except as noted above.  Physical  Exam: Filed Vitals:   03/07/15 2208 03/08/15 0142 03/08/15 0427 03/08/15 0523  BP: 183/72 174/71  174/73  Pulse: 65 66  63  Temp: 98.1 F (36.7 C) 97.7 F (36.5 C)  97.8 F (36.6 C)  TempSrc: Oral Oral  Oral  Resp: Height:      Weight:   185 lb 8 oz (84.142 kg)   SpO2: 95% 95%  95%   GEN- The patient is elderly appearing, alert and oriented x 3 today.   Head- normocephalic, atraumatic Eyes-  Sclera clear, conjunctiva pink Ears- hearing intact Oropharynx- clear Neck- supple Lungs- Clear to ausculation bilaterally, normal work of breathing Heart- Regular rate and rhythm  GI- soft, NT, ND, + BS Extremities- no clubbing, cyanosis, or edema MS- no significant deformity or atrophy Skin- no rash or lesion Psych- euthymic mood, full affect   Labs:   Lab Results  Component Value Date   WBC 8.4 03/08/2015   HGB 11.4* 03/08/2015   HCT 34.2* 03/08/2015   MCV 89.5 03/08/2015   PLT 314 03/08/2015    Recent Labs Lab 03/06/15 0507  03/08/15 0657  NA 141  < > 138  K 3.0*  < > 3.5  CL 106  < > 103  CO2 22  < > 27  BUN 20  < > 22*  CREATININE 0.67  < > 0.86  CALCIUM 8.3*  < > 9.0  PROT 5.7*  --   --   BILITOT 0.8  --   --   ALKPHOS 58  --   --   ALT 14  --   --   AST 14*  --   --   GLUCOSE 180*  < > 122*  < > = values in this interval not displayed.  Radiology/Studies: Ct Angio Head W/cm &/or Wo Cm 03/06/2015  CLINICAL DATA:  82 year old female with off balance, slurred speech, altered mental status and left facial droop. Left cerebellar infarct. Subsequent encounter. EXAM: CT ANGIOGRAPHY HEAD AND NECK TECHNIQUE: Multidetector CT imaging of the head and neck was performed using the standard protocol during bolus administration of intravenous contrast. Multiplanar CT image reconstructions and MIPs were obtained to evaluate the vascular anatomy. Carotid stenosis measurements (when applicable) are obtained utilizing NASCET criteria, using the distal internal carotid  diameter as the denominator. CONTRAST:  80mL OMNIPAQUE IOHEXOL 350 MG/ML SOLN COMPARISON:  03/04/2015 and 02/26/2014 head CT. 03/03/2015 brain MR. FINDINGS: CT HEAD Brain: Large left cerebellar infarct with hemorrhage better appreciated on recently performed MR. Mass-effect upon the adjacent structures including narrowing of the fourth ventricle and inferior aspect of the aqueduct has progressed slightly since the prior exam (series 201, image 10). Dilated lateral ventricles/ mild hydrocephalus similar to prior exam (best appreciated when compared to remote CT). Calvarium  and skull base: Negative. Paranasal sinuses: Minimal mucosal thickening maxillary sinuses greatest inferior right maxillary sinus with slight polypoid opacification. Orbits: Mild exophthalmos. CTA NECK Aortic arch: 3 vessel arch. Right carotid system: Plaque right carotid bifurcation proximal right internal carotid artery with maximal 50% diameter stenosis proximal right internal carotid artery. Left carotid system: Plaque left carotid bifurcation proximal left internal carotid artery with maximal 50% diameter stenosis proximal left internal carotid artery. Vertebral arteries:Right vertebral artery is slightly larger than left. No significant stenosis of the cervical segment of the vertebral arteries on either side. Plaque with mild narrowing right subclavian artery. Skeleton: Cervical spondylotic changes most notable C6-7. Other neck: No worrisome primary neck mass. Small to slightly moderate-size bilateral pleural effusions with adjacent passive atelectasis. Endotracheal tube and nasogastric tube in place. CTA HEAD Anterior circulation: Mild narrowing irregularity M1 segment left middle cerebral artery. Mild narrowing right middle cerebral artery bifurcation. Calcified plaque with mild narrowing cavernous segment internal carotid artery bilaterally. Posterior circulation: Narrowing of the left vertebral artery from the foramen magnum to formation  of the basilar artery. Poor delineation of the left posterior inferior cerebellar artery. No significant stenosis of the distal right vertebral artery. Mild irregularity of the basilar artery without significant stenosis. Irregular anterior inferior cerebellar arteries bilaterally. Fetal type contribution to formation of the left posterior cerebral artery. No high-grade stenosis of the posterior cerebral artery however, distal branch vessel narrowing and irregularity greater on the right. No aneurysm noted. Venous sinuses: Patent. Anatomic variants: As above. Delayed phase: As above. IMPRESSION: CT HEAD Large left cerebellar infarct with hemorrhage better appreciated on recently performed MR. Mass-effect upon the adjacent structures including narrowing of the fourth ventricle and inferior aspect of the aqueduct has progressed slightly since the prior exam (series 201, image 10). Dilated lateral ventricles/ mild hydrocephalus similar to prior exam (best appreciated when compared to remote CT). CTA NECK Plaque carotid bifurcation / proximal internal carotid artery bilaterally with maximal 50% diameter stenosis proximal internal carotid artery bilaterally. Right vertebral artery is slightly larger than left. No significant stenosis of the cervical segment of the vertebral arteries on either side. Small to slightly moderate-size bilateral pleural effusions with adjacent passive atelectasis. Endotracheal tube and nasogastric tube in place. CTA HEAD Mild narrowing irregularity M1 segment left middle cerebral artery. Mild narrowing right middle cerebral artery bifurcation. Calcified plaque with mild narrowing cavernous segment internal carotid artery bilaterally. Narrowing of the left vertebral artery from the foramen magnum to formation of the basilar artery. Poor delineation of the left posterior inferior cerebellar artery and appears occluded. No significant stenosis of the distal right vertebral artery. Mild  irregularity of the basilar artery without significant stenosis. Irregular anterior inferior cerebellar arteries bilaterally. Fetal type contribution to formation of the left posterior cerebral artery. No high-grade stenosis of the posterior cerebral artery however, distal branch vessel narrowing and irregularity greater on the right. Electronically Signed   By: Lacy Duverney M.D.   On: 03/06/2015 08:04   Dg Chest 1 View 03/03/2015  CLINICAL DATA:  Acute onset of hypoxia. Cough and shortness of breath. Insert macro EXAM: CHEST 1 VIEW COMPARISON:  Chest radiograph performed 02/27/2015 FINDINGS: The lungs are well-aerated. Patchy right basilar airspace opacity raises concern for pneumonia. The appearance is less typical for asymmetric pulmonary edema. Underlying vascular congestion is noted. Mild left perihilar opacity is seen. No pleural effusion or pneumothorax is identified. The cardiomediastinal silhouette is mildly enlarged. No acute osseous abnormalities are seen. IMPRESSION: 1. Patchy right basilar airspace  opacity raises concern for pneumonia. The appearance is less typical for asymmetric pulmonary edema. Mild left perihilar opacity seen. 2. Underlying vascular congestion and mild cardiomegaly. Electronically Signed   By: Roanna Raider M.D.   On: 03/03/2015 01:06   Ct Angio Neck W/cm &/or Wo/cm 03/06/2015  CLINICAL DATA:  82 year old female with off balance, slurred speech, altered mental status and left facial droop. Left cerebellar infarct. Subsequent encounter. EXAM: CT ANGIOGRAPHY HEAD AND NECK TECHNIQUE: Multidetector CT imaging of the head and neck was performed using the standard protocol during bolus administration of intravenous contrast. Multiplanar CT image reconstructions and MIPs were obtained to evaluate the vascular anatomy. Carotid stenosis measurements (when applicable) are obtained utilizing NASCET criteria, using the distal internal carotid diameter as the denominator. CONTRAST:  80mL  OMNIPAQUE IOHEXOL 350 MG/ML SOLN COMPARISON:  03/04/2015 and 02/26/2014 head CT. 03/03/2015 brain MR. FINDINGS: CT HEAD Brain: Large left cerebellar infarct with hemorrhage better appreciated on recently performed MR. Mass-effect upon the adjacent structures including narrowing of the fourth ventricle and inferior aspect of the aqueduct has progressed slightly since the prior exam (series 201, image 10). Dilated lateral ventricles/ mild hydrocephalus similar to prior exam (best appreciated when compared to remote CT). Calvarium and skull base: Negative. Paranasal sinuses: Minimal mucosal thickening maxillary sinuses greatest inferior right maxillary sinus with slight polypoid opacification. Orbits: Mild exophthalmos. CTA NECK Aortic arch: 3 vessel arch. Right carotid system: Plaque right carotid bifurcation proximal right internal carotid artery with maximal 50% diameter stenosis proximal right internal carotid artery. Left carotid system: Plaque left carotid bifurcation proximal left internal carotid artery with maximal 50% diameter stenosis proximal left internal carotid artery. Vertebral arteries:Right vertebral artery is slightly larger than left. No significant stenosis of the cervical segment of the vertebral arteries on either side. Plaque with mild narrowing right subclavian artery. Skeleton: Cervical spondylotic changes most notable C6-7. Other neck: No worrisome primary neck mass. Small to slightly moderate-size bilateral pleural effusions with adjacent passive atelectasis. Endotracheal tube and nasogastric tube in place. CTA HEAD Anterior circulation: Mild narrowing irregularity M1 segment left middle cerebral artery. Mild narrowing right middle cerebral artery bifurcation. Calcified plaque with mild narrowing cavernous segment internal carotid artery bilaterally. Posterior circulation: Narrowing of the left vertebral artery from the foramen magnum to formation of the basilar artery. Poor delineation of  the left posterior inferior cerebellar artery. No significant stenosis of the distal right vertebral artery. Mild irregularity of the basilar artery without significant stenosis. Irregular anterior inferior cerebellar arteries bilaterally. Fetal type contribution to formation of the left posterior cerebral artery. No high-grade stenosis of the posterior cerebral artery however, distal branch vessel narrowing and irregularity greater on the right. No aneurysm noted. Venous sinuses: Patent. Anatomic variants: As above. Delayed phase: As above. IMPRESSION: CT HEAD Large left cerebellar infarct with hemorrhage better appreciated on recently performed MR. Mass-effect upon the adjacent structures including narrowing of the fourth ventricle and inferior aspect of the aqueduct has progressed slightly since the prior exam (series 201, image 10). Dilated lateral ventricles/ mild hydrocephalus similar to prior exam (best appreciated when compared to remote CT). CTA NECK Plaque carotid bifurcation / proximal internal carotid artery bilaterally with maximal 50% diameter stenosis proximal internal carotid artery bilaterally. Right vertebral artery is slightly larger than left. No significant stenosis of the cervical segment of the vertebral arteries on either side. Small to slightly moderate-size bilateral pleural effusions with adjacent passive atelectasis. Endotracheal tube and nasogastric tube in place. CTA HEAD Mild narrowing irregularity M1  segment left middle cerebral artery. Mild narrowing right middle cerebral artery bifurcation. Calcified plaque with mild narrowing cavernous segment internal carotid artery bilaterally. Narrowing of the left vertebral artery from the foramen magnum to formation of the basilar artery. Poor delineation of the left posterior inferior cerebellar artery and appears occluded. No significant stenosis of the distal right vertebral artery. Mild irregularity of the basilar artery without  significant stenosis. Irregular anterior inferior cerebellar arteries bilaterally. Fetal type contribution to formation of the left posterior cerebral artery. No high-grade stenosis of the posterior cerebral artery however, distal branch vessel narrowing and irregularity greater on the right. Electronically Signed   By: Lacy Duverney M.D.   On: 03/06/2015 08:04   Dg Chest Port 1 View 03/07/2015  CLINICAL DATA:  Pulmonary edema. EXAM: PORTABLE CHEST 1 VIEW COMPARISON:  03/06/2015. FINDINGS: Interim extubation and removal of NG tube. Cardiomegaly with persistent bilateral pulmonary infiltrates suggesting pulmonary edema. Slight interim clearing. Persistent low lung volumes with basilar atelectasis. No prominent pleural effusion or pneumothorax. IMPRESSION: 1. Interim removal of endotracheal tube and NG tube. 2. Cardiomegaly with persistent bilateral pulmonary infiltrates consistent with pulmonary edema. Slight improvement from prior exam. Persistent low lung volumes with mild basilar atelectasis. Electronically Signed   By: Maisie Fus  Register   On: 03/07/2015 07:24   12-lead ECG sinus rhythm, rate 68 All prior EKG's in EPIC reviewed with no documented atrial fibrillation  Telemetry sinus rhythm with atrial tachycardia  Assessment and Plan:  1. Cryptogenic stroke The patient presents with cryptogenic stroke.  The patient has a TEE planned for this AM.  I spoke at length with the patient about monitoring for afib with either a 30 day event monitor or an implantable loop recorder. Because of atrial tachycardia seen on telemetry, I feel that starting with a 30 day monitor is most reasonable. Discussed with patient who agrees with plan.  Follow up with me in office in 6 weeks to discuss event monitor results and decide about need for implantable loop recorder.  Please call with questions.   Marily Lente, NP 03/08/2015 9:02 AM  I have seen, examined the patient, and reviewed the above assessment and plan.   On exam, RRR.  Changes to above are made where necessary.  Given prior subclavian thrombus, would probably be reasonable to place on coumadin long term, however risks are elevated given prior ICH.  I am not certain that an implantable loop recorder would change this equation.  I would favor 30 day monitor given atrial ectopy seen on telemetry.    Electrophysiology team to see as needed while here. Please call with questions.   Co Sign: Hillis Range, MD 03/08/2015 5:45 PM

## 2015-03-08 NOTE — Progress Notes (Signed)
STROKE TEAM PROGRESS NOTE   HISTORY OF PRESENT ILLNESS Nancy Hahn is an 82 y.o. female with a history of a breast mass and hypertension who was admitted to [facility] on 02/26/2014 for acute left subclavian artery thrombosis. She underwent thrombectomy on 02/28/2015. Patient was on IV heparin initially and was changed to Eliquis. She became febrile overnight with a throat and coughing with chest x-ray findings consistent with aspiration pneumonia. She also developed a change in mental status and an MRI which is a large acute hemorrhagic left cerebellar infarction mass effect on fourth ventricle. A shunt had not developed hydrocephalus, however. She was intubated on mechanical ventilation and subsequently transferred to [facility] for further management. Reversal of anticoagulation with Eloquis was gone.  LSN: Unclear tPA Given: No: Acute hemorrhagic cerebral infarction in the setting of anticoagulation with Eliquis. mRankin:   SUBJECTIVE (INTERVAL HISTORY) She is neurologically stable.   family is  Not at bedside. She did not get TEE due to elevated BP.She has done well. but is awake and following commands well.   OBJECTIVE Temp:  [97.7 F (36.5 C)-98.3 F (36.8 C)] 97.8 F (36.6 C) (01/26 1302) Pulse Rate:  [59-86] 59 (01/26 1302) Cardiac Rhythm:  [-] Normal sinus rhythm (01/26 0748) Resp:  [12-17] 16 (01/26 1302) BP: (174-202)/(71-83) 195/73 mmHg (01/26 1302) SpO2:  [94 %-97 %] 96 % (01/26 1302) Weight:  [185 lb 8 oz (84.142 kg)] 185 lb 8 oz (84.142 kg) (01/26 0427)  CBC:   Recent Labs Lab 03/07/15 0446 03/08/15 0657  WBC 8.7 8.4  NEUTROABS 7.2 6.3  HGB 10.9* 11.4*  HCT 32.6* 34.2*  MCV 89.8 89.5  PLT 299 314    Basic Metabolic Panel:   Recent Labs Lab 03/07/15 0446 03/08/15 0657  NA 140 138  K 3.5 3.5  CL 97* 103  CO2 26 27  GLUCOSE 149* 122*  BUN 13 22*  CREATININE 0.84 0.86  CALCIUM 8.5* 9.0  MG 1.9 2.1  PHOS 4.0 3.7    Lipid Panel:    Component Value Date/Time   CHOL 108 03/05/2015 0735   TRIG 104 03/06/2015 0507   HDL 22* 03/05/2015 0735   CHOLHDL 4.9 03/05/2015 0735   VLDL 30 03/05/2015 0735   LDLCALC 56 03/05/2015 0735   HgbA1c:  Lab Results  Component Value Date   HGBA1C 6.6* 02/27/2015   Urine Drug Screen: No results found for: LABOPIA, COCAINSCRNUR, LABBENZ, AMPHETMU, THCU, LABBARB    IMAGING  Dg Chest 1 View  03/03/2015   1. Patchy right basilar airspace opacity raises concern for pneumonia. The appearance is less typical for asymmetric pulmonary edema. Mild left perihilar opacity seen.  2. Underlying vascular congestion and mild cardiomegaly.    Dg Abd 1 View 03/03/2015   Enteric catheter with tip overlying the gastric body.      Mr Laqueta Jean Wo Contrast 03/03/2015   Large acute hemorrhagic left mid to inferior cerebellar infarct with associated mass effect compressing the fourth ventricle. Interval slight increase in size of ventricles compared to recent CT consistent with developing hydrocephalus. Left vertebral artery is at least partially patent. Limited assessment of the left posterior inferior cerebellar artery.      Portable Chest Xray 03/03/2015   1. Endotracheal tube seen ending 2-3 cm above the carina.  2. Bilateral central airspace opacity is perhaps slightly worsened and raises concern for pulmonary edema. Mild cardiomegaly.   Dg Chest Port 1 View 03/03/2015   1. Endotracheal tube tip in  good position. Enteric tube courses to the abdomen.  2. Radiographic progression of right lung base pneumonia and/or aspiration since earlier today. No pleural effusion identified.   PHYSICAL EXAM Frail elderly lady   . Afebrile. Head is nontraumatic. Neck is supple without bruit. Cardiac exam no murmur or gallop. Lungs are clear to auscultation. Distal pulses are well felt. Neurological Exam :  Awake and alert. PERRL. fundi not visualized. Vision acuity and fields seem adequate. Extraocular  movements are full range without nystagmus. Mild saccadic dysmetria on left gaze Face symmetric. Opens eyes to light stimulation.   Can follow simple commands, nods to questions. No gaze deviation. Does track. Conjugate gaze.No nystagmus. moves all extremities equally. Tongue midline. Cough and gag intact. No extremity drift or focal weakness. Mild left finger-to-nose dysmetria. Sensation appears intact bilaterally. Toes equivocal.  Gait deferred Gait: Unable to test      ASSESSMENT/PLAN [name] is a 82 y.o. female with history of breast mass, hypertension, recent aspiration pneumonia, left subclavian artery thrombosis s/p   thrombectomy on 02/28/2015 and subsequent Eliquis therapy, presenting with altered mental status. She did not receive IV t-PA due to hemorrhage.   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 source  Resultant  No focal motor deficits but unable to assess balance and walking due to intubation  MRI  - Large acute hemorrhagic left mid to inferior cerebellar infarct with associated mass effect.  MRA - not performed  Carotid Doppler - not indicated  2D Echo - EF 55-60%. No cardiac source of emboli identified.  LDL - not performed  HgbA1c not performed  VTE prophylaxis - SCDs  Diet NPO time specified Diet regular Room service appropriate?: Yes; Fluid consistency:: Thin  aspirin 81 mg daily prior to admission, now on No antithrombotic secondary to hemorrhage.  Ongoing aggressive stroke risk factor management  Therapy recommendations: Pending  Disposition:  Pending  Hypertension  Blood pressure tends to run low   Other Stroke Risk Factors  Advanced age  Obesity, Body mass index is 28.21 kg/(m^2).    Other Active Problems  Anemia - monitor  Pneumonia - CCM following - on Zosyn and vancomycin - Day #3    Hospital day # 1   I personally examined patient and images, and have participated in  and made any corrections needed to history, physical, neuro exam,assessment and plan as stated above.  I have personally obtained the history, evaluated labs, reviewed imaging studies and agree with radiology interpretations. Patient was admitted to [facility] for acute subclavian artery thrombosis and thrombectomy, she was on IV heparin initially and was changed to Eliquis. She had a change in MS found to have hemorrhagic left cerebellar infarction with mass effect on fourth ventricle.   she is neurologically stable. . Recommend checking a TEE and loop recorder for etiology for her subclavian thrombosis.agree with more aggressive BP control   Delia Heady, MD [facility] Pager: [phone] 05/05/2014 5:09 PM     To contact Stroke Continuity provider, please refer to [email or web address]. After hours, contact General Neurology

## 2015-03-09 ENCOUNTER — Inpatient Hospital Stay (HOSPITAL_COMMUNITY): Payer: Commercial Managed Care - HMO

## 2015-03-09 DIAGNOSIS — E1129 Type 2 diabetes mellitus with other diabetic kidney complication: Secondary | ICD-10-CM

## 2015-03-09 LAB — GLUCOSE, CAPILLARY
GLUCOSE-CAPILLARY: 144 mg/dL — AB (ref 65–99)
GLUCOSE-CAPILLARY: 152 mg/dL — AB (ref 65–99)
Glucose-Capillary: 133 mg/dL — ABNORMAL HIGH (ref 65–99)
Glucose-Capillary: 189 mg/dL — ABNORMAL HIGH (ref 65–99)

## 2015-03-09 LAB — BASIC METABOLIC PANEL
ANION GAP: 13 (ref 5–15)
BUN: 44 mg/dL — AB (ref 6–20)
CHLORIDE: 102 mmol/L (ref 101–111)
CO2: 25 mmol/L (ref 22–32)
Calcium: 9.2 mg/dL (ref 8.9–10.3)
Creatinine, Ser: 1.06 mg/dL — ABNORMAL HIGH (ref 0.44–1.00)
GFR calc non Af Amer: 48 mL/min — ABNORMAL LOW (ref >60)
GFR, EST AFRICAN AMERICAN: 56 mL/min — AB (ref >60)
Glucose, Bld: 154 mg/dL — ABNORMAL HIGH (ref 65–99)
Potassium: 4 mmol/L (ref 3.5–5.1)
SODIUM: 140 mmol/L (ref 135–145)

## 2015-03-09 MED ORDER — CARVEDILOL 12.5 MG PO TABS: 12.5000 mg | ORAL_TABLET | Freq: Two times a day (BID) | ORAL | Status: DC

## 2015-03-09 MED ORDER — HYDRALAZINE HCL 50 MG PO TABS: 50.0000 mg | ORAL_TABLET | Freq: Three times a day (TID) | ORAL | Status: AC

## 2015-03-09 MED ORDER — INSULIN ASPART 100 UNIT/ML ~~LOC~~ SOLN: SUBCUTANEOUS | Status: DC

## 2015-03-09 MED ORDER — METFORMIN HCL 500 MG PO TABS: 500.0000 mg | ORAL_TABLET | Freq: Two times a day (BID) | ORAL | Status: DC

## 2015-03-09 MED ORDER — LISINOPRIL 10 MG PO TABS: 10.0000 mg | ORAL_TABLET | Freq: Every day | ORAL | Status: DC

## 2015-03-09 MED ORDER — ENSURE ENLIVE PO LIQD: 237.0000 mL | Freq: Two times a day (BID) | ORAL | Status: DC

## 2015-03-09 NOTE — NC FL2 (Signed)
[place] MEDICAID [id number] LEVEL OF CARE SCREENING TOOL     IDENTIFICATION  Patient Name: Nancy Hahn Birthdate: May 25, 1931 Sex: female Admission Date (Current Location): 03/03/2015  [facility] and [place] Number:  Chiropodist and Address:  [facility]. [facility], [address], [place], [place] [address]      Provider Number: [id number]  Attending Physician Name and Address:  Maretta Bees, MD  Relative Name and Phone Number:       Current Level of Care: Hospital Recommended Level of Care: Skilled Nursing Facility Prior Approval Number:    Date Approved/Denied:   PASRR Number:   [id number]  Discharge Plan: SNF    Current Diagnoses: Patient Active Problem List   Diagnosis Date Noted  . Endotracheal tube present   . Acute respiratory failure with hypoxia (HCC) 03/04/2015  . HCAP (healthcare-associated pneumonia) 03/04/2015  . Cerebral hemorrhage (HCC) 03/03/2015  . Arterial embolism (HCC) 02/28/2015  . Breast microcalcification, mammographic 05/27/2012  . Breast mass     Orientation RESPIRATION BLADDER Height & Weight    Self, Time, Situation, Place  Normal Continent  (172.7 cm) 185 lbs.  BEHAVIORAL SYMPTOMS/MOOD NEUROLOGICAL BOWEL NUTRITION STATUS   (NONE )  (NONE ) Incontinent Diet  AMBULATORY STATUS COMMUNICATION OF NEEDS Skin   Extensive Assist Verbally Surgical wounds                       Personal Care Assistance Level of Assistance  Bathing, Dressing Bathing Assistance: Limited assistance   Dressing Assistance: Limited assistance     Functional Limitations Info   (NONE)          SPECIAL CARE FACTORS FREQUENCY  PT (By licensed PT), OT (By licensed OT)     PT Frequency: 4 OT Frequency: 2            Contractures      Additional Factors Info  Code Status, Allergies Code Status Info: FULL CODE  Allergies Info: N/A           Current Medications (03/09/2015):  This is the current hospital active  medication list Current Facility-Administered Medications  Medication Dose Route Frequency Provider Last Rate Last Dose  . 0.9 %  sodium chloride infusion  250 mL Intravenous PRN Jamie Kato, MD   Stopped at 03/07/15 1200  . carvedilol (COREG) tablet 6.25 mg  6.25 mg Oral BID WC Maretta Bees, MD   Stopped at 03/09/15 0800  . feeding supplement (ENSURE ENLIVE) (ENSURE ENLIVE) liquid 237 mL  237 mL Oral BID BM Arlyss Gandy, RD   Stopped at 03/09/15 1000  . hydrALAZINE (APRESOLINE) injection 10 mg  10 mg Intravenous Q6H PRN Shanker Levora Dredge, MD      . hydrALAZINE (APRESOLINE) tablet 50 mg  50 mg Oral 3 times per day Maretta Bees, MD   50 mg at 03/08/15 2250  . insulin aspart (novoLOG) injection 0-9 Units  0-9 Units Subcutaneous TID WC Maretta Bees, MD   2 Units at 03/09/15 1153  . ondansetron (ZOFRAN) injection 4 mg  4 mg Intravenous Q6H PRN Ramond Marrow, DO   4 mg at 03/07/15 0426  . simvastatin (ZOCOR) tablet 20 mg  20 mg Oral Daily Jamie Kato, MD   Stopped at 03/09/15 1000     Discharge Medications: Please see discharge summary for a list of discharge medications.  Relevant Imaging Results:  Relevant Lab Results:   Additional Information SSN:  [id number]  Vaughan Browner, LCSW

## 2015-03-09 NOTE — Progress Notes (Signed)
Pt with a small amount of brown liquidy vomit.  Denies upset stomach, VSS.  MD made aware.  Will continue to monitor. Sondra Come, RN

## 2015-03-09 NOTE — Discharge Summary (Addendum)
PATIENT DETAILS Name: Nancy Hahn Age: 82 y.o. Sex: female Date of Birth: [birth year] MRN: [id number]. Admitting Physician: Kalman Shan, MD NWG:[id number], Lavada Mesi, MD  Admit Date: 03/03/2015 Discharge date: 03/13/2015  Recommendations for Outpatient Follow-up:  1. Ensure follow up with neurology 2. Ensure follow up with cardiology for 30 day event monitor 3. Ensure follow up with vascular surgery-post hospital discharge visit 4. Start Eliquis 1 week from 03/11/15 5. Complete 14 days of quadruple therapy for H Pylori 6. Please check CBC and chemistry panel in 1 week   PRIMARY DISCHARGE DIAGNOSIS:  Active Problems:   Cerebral hemorrhage (HCC)   Acute respiratory failure with hypoxia (HCC)   HCAP (healthcare-associated pneumonia)   Endotracheal tube present   Stroke (cerebrum) (HCC)   Subclavian artery thrombosis (HCC)   HLD (hyperlipidemia)   Essential hypertension   UGI bleed   Stroke [facility])      PAST MEDICAL HISTORY: Past Medical History  Diagnosis Date  . Breast mass   . Hypertension     DISCHARGE MEDICATIONS: Current Discharge Medication List    START taking these medications   Details  bismuth subsalicylate (PEPTO-BISMOL) 262 MG/15ML suspension Take 30 mLs by mouth 4 (four) times daily. For 14 days from 03/13/15    carvedilol (COREG) 12.5 MG tablet Take 1 tablet (12.5 mg total) by mouth 2 (two) times daily with a meal.    feeding supplement, ENSURE ENLIVE, (ENSURE ENLIVE) LIQD Take 237 mLs by mouth 2 (two) times daily between meals.    hydrALAZINE (APRESOLINE) 50 MG tablet Take 1 tablet (50 mg total) by mouth every 8 (eight) hours.    insulin aspart (NOVOLOG) 100 UNIT/ML injection 0-9 Units, Subcutaneous, 3 times daily with meals CBG < 70: implement hypoglycemia protocol CBG 70 - 120: 0 units CBG 121 - 150: 1 unit CBG 151 - 200: 2 units CBG 201 - 250: 3 units CBG 251 - 300: 5 units CBG 301 - 350: 7 units CBG 351 - 400: 9 units CBG > 400: call MD     metFORMIN (GLUCOPHAGE) 500 MG tablet Take 1 tablet (500 mg total) by mouth 2 (two) times daily with a meal.    metroNIDAZOLE (FLAGYL) 250 MG tablet Take 1 tablet (250 mg total) by mouth 4 (four) times daily. For 14 days from 03/13/15    pantoprazole (PROTONIX) 40 MG tablet Take 1 tablet (40 mg total) by mouth 2 (two) times daily.    tetracycline (ACHROMYCIN,SUMYCIN) 500 MG capsule Take 1 capsule (500 mg total) by mouth 4 (four) times daily. For 14 days from 03/13/15      CONTINUE these medications which have NOT CHANGED   Details  simvastatin (ZOCOR) 20 MG tablet Take 20 mg by mouth daily.      STOP taking these medications     aspirin 81 MG tablet      atenolol (TENORMIN) 50 MG tablet      NIFEdipine (PROCARDIA XL/ADALAT-CC) 90 MG 24 hr tablet      lisinopril (PRINIVIL,ZESTRIL) 40 MG tablet         ALLERGIES:  No Known Allergies  BRIEF HPI:  See H&P, Labs, Consult and Test reports for all details in brief, 82 y/o woman who was admitted to [facility] on 1/18 w/ cold and painful LUE. 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 She was treated with heparin and then transitioned to Elliquis. On the morning of 1/21 she was unable to maintain her  balance, slurred speech, and L facial droop. An MRI was was obtained which showed a large cerebral stroke w/ evidence of hemorrhage and concern for developing hydrocephalous. She was subsequently intubated, and transferred to [facility].   CONSULTATIONS:   cardiology, pulmonary/intensive care and neurology  PERTINENT RADIOLOGIC STUDIES: Ct Angio Head W/cm &/or Wo Cm  03/06/2015  CLINICAL DATA:  82 year old female with off balance, slurred speech, altered mental status and left facial droop. Left cerebellar infarct. Subsequent encounter. EXAM: CT ANGIOGRAPHY HEAD AND NECK TECHNIQUE: Multidetector CT imaging of the head and neck was performed using the standard protocol during bolus  administration of intravenous contrast. Multiplanar CT image reconstructions and MIPs were obtained to evaluate the vascular anatomy. Carotid stenosis measurements (when applicable) are obtained utilizing NASCET criteria, using the distal internal carotid diameter as the denominator. CONTRAST:  80mL OMNIPAQUE IOHEXOL 350 MG/ML SOLN COMPARISON:  03/04/2015 and 02/26/2014 head CT. 03/03/2015 brain MR. FINDINGS: CT HEAD Brain: Large left cerebellar infarct with hemorrhage better appreciated on recently performed MR. Mass-effect upon the adjacent structures including narrowing of the fourth ventricle and inferior aspect of the aqueduct has progressed slightly since the prior exam (series 201, image 10). Dilated lateral ventricles/ mild hydrocephalus similar to prior exam (best appreciated when compared to remote CT). Calvarium and skull base: Negative. Paranasal sinuses: Minimal mucosal thickening maxillary sinuses greatest inferior right maxillary sinus with slight polypoid opacification. Orbits: Mild exophthalmos. CTA NECK Aortic arch: 3 vessel arch. Right carotid system: Plaque right carotid bifurcation proximal right internal carotid artery with maximal 50% diameter stenosis proximal right internal carotid artery. Left carotid system: Plaque left carotid bifurcation proximal left internal carotid artery with maximal 50% diameter stenosis proximal left internal carotid artery. Vertebral arteries:Right vertebral artery is slightly larger than left. No significant stenosis of the cervical segment of the vertebral arteries on either side. Plaque with mild narrowing right subclavian artery. Skeleton: Cervical spondylotic changes most notable C6-7. Other neck: No worrisome primary neck mass. Small to slightly moderate-size bilateral pleural effusions with adjacent passive atelectasis. Endotracheal tube and nasogastric tube in place. CTA HEAD Anterior circulation: Mild narrowing irregularity M1 segment left middle cerebral  artery. Mild narrowing right middle cerebral artery bifurcation. Calcified plaque with mild narrowing cavernous segment internal carotid artery bilaterally. Posterior circulation: Narrowing of the left vertebral artery from the foramen magnum to formation of the basilar artery. Poor delineation of the left posterior inferior cerebellar artery. No significant stenosis of the distal right vertebral artery. Mild irregularity of the basilar artery without significant stenosis. Irregular anterior inferior cerebellar arteries bilaterally. Fetal type contribution to formation of the left posterior cerebral artery. No high-grade stenosis of the posterior cerebral artery however, distal branch vessel narrowing and irregularity greater on the right. No aneurysm noted. Venous sinuses: Patent. Anatomic variants: As above. Delayed phase: As above. IMPRESSION: CT HEAD Large left cerebellar infarct with hemorrhage better appreciated on recently performed MR. Mass-effect upon the adjacent structures including narrowing of the fourth ventricle and inferior aspect of the aqueduct has progressed slightly since the prior exam (series 201, image 10). Dilated lateral ventricles/ mild hydrocephalus similar to prior exam (best appreciated when compared to remote CT). CTA NECK Plaque carotid bifurcation / proximal internal carotid artery bilaterally with maximal 50% diameter stenosis proximal internal carotid artery bilaterally. Right vertebral artery is slightly larger than left. No significant stenosis of the cervical segment of the vertebral arteries on either side. Small to slightly moderate-size bilateral pleural effusions with adjacent passive atelectasis. Endotracheal tube  and nasogastric tube in place. CTA HEAD Mild narrowing irregularity M1 segment left middle cerebral artery. Mild narrowing right middle cerebral artery bifurcation. Calcified plaque with mild narrowing cavernous segment internal carotid artery bilaterally.  Narrowing of the left vertebral artery from the foramen magnum to formation of the basilar artery. Poor delineation of the left posterior inferior cerebellar artery and appears occluded. No significant stenosis of the distal right vertebral artery. Mild irregularity of the basilar artery without significant stenosis. Irregular anterior inferior cerebellar arteries bilaterally. Fetal type contribution to formation of the left posterior cerebral artery. No high-grade stenosis of the posterior cerebral artery however, distal branch vessel narrowing and irregularity greater on the right. Electronically Signed   By: Lacy Duverney M.D.   On: 03/06/2015 08:04   Dg Chest 1 View  03/03/2015  CLINICAL DATA:  Acute onset of hypoxia. Cough and shortness of breath. Insert macro EXAM: CHEST 1 VIEW COMPARISON:  Chest radiograph performed 02/27/2015 FINDINGS: The lungs are well-aerated. Patchy right basilar airspace opacity raises concern for pneumonia. The appearance is less typical for asymmetric pulmonary edema. Underlying vascular congestion is noted. Mild left perihilar opacity is seen. No pleural effusion or pneumothorax is identified. The cardiomediastinal silhouette is mildly enlarged. No acute osseous abnormalities are seen. IMPRESSION: 1. Patchy right basilar airspace opacity raises concern for pneumonia. The appearance is less typical for asymmetric pulmonary edema. Mild left perihilar opacity seen. 2. Underlying vascular congestion and mild cardiomegaly. Electronically Signed   By: Roanna Raider M.D.   On: 03/03/2015 01:06   Dg Chest 2 View  02/27/2015  CLINICAL DATA:  Acute onset left arm pain at 4:30 p.m. Hahn. Initial encounter. EXAM: CHEST  2 VIEW COMPARISON:  None. FINDINGS: Calcified granuloma on the right is noted. The lungs are otherwise clear. Heart size is normal. No pneumothorax or pleural effusion. Thoracic spondylosis is identified. IMPRESSION: No acute abnormality. Electronically Signed   By:  Drusilla Kanner M.D.   On: 02/27/2015 20:18   Dg Abd 1 View  03/03/2015  CLINICAL DATA:  Status post enteric catheter placement. EXAM: ABDOMEN - 1 VIEW COMPARISON:  None. FINDINGS: Enteric catheter seen with tip overlying gastric body. The bowel gas pattern is normal. No radiographic evidence of organomegaly. No radio-opaque calculi or other significant radiographic abnormality are seen. IMPRESSION: Enteric catheter with tip overlying the gastric body. Electronically Signed   By: Ted Mcalpine M.D.   On: 03/03/2015 19:05   Ct Head Wo Contrast  03/10/2015  CLINICAL DATA:  Recent stroke with left-sided weakness and slurred speech. New frontal headache. EXAM: CT HEAD WITHOUT CONTRAST TECHNIQUE: Contiguous axial images were obtained from the base of the skull through the vertex without intravenous contrast. COMPARISON:  03/06/2015 and previous FINDINGS: Go low-density persists in the inferior cerebellum on the left, but swelling is subsidence slightly. Less mass effect upon the fourth ventricle. No evidence of developing hemorrhage. No new insult. Cerebral hemispheres are normal. Ventricular size is stable. No extra-axial collection. Sinuses, middle ears and mastoids are clear. IMPRESSION: Expected evolutionary changes of the inferior left cerebellar infarction. Less swelling and mass effect than on the previous studies. No evidence of complication such as hemorrhage. No new infarction. Electronically Signed   By: Paulina Fusi M.D.   On: 03/10/2015 13:12   Ct Head Wo Contrast  03/04/2015  CLINICAL DATA:  Known left cerebellar infarct, evaluate for hydrocephalus EXAM: CT HEAD WITHOUT CONTRAST TECHNIQUE: Contiguous axial images were obtained from the base of the skull through the vertex  without intravenous contrast. COMPARISON:  03/03/2015 FINDINGS: Bony calvarium is intact. There are again noted changes consistent with a left cerebellar infarct with mass-effect upon the fourth ventricle and pons. The  lateral ventricles arm more prominent than that seen on a prior exam from 02/26/2014 but relatively stable from the recent MRI examination from the previous day. No acute hemorrhage is identified. No other focal abnormality is seen. IMPRESSION: Left cerebellar infarct with evidence of mild ventricular dilatation stable from the previous day. Electronically Signed   By: Alcide Clever M.D.   On: 03/04/2015 09:07   Ct Angio Neck W/cm &/or Wo/cm  03/06/2015  CLINICAL DATA:  82 year old female with off balance, slurred speech, altered mental status and left facial droop. Left cerebellar infarct. Subsequent encounter. EXAM: CT ANGIOGRAPHY HEAD AND NECK TECHNIQUE: Multidetector CT imaging of the head and neck was performed using the standard protocol during bolus administration of intravenous contrast. Multiplanar CT image reconstructions and MIPs were obtained to evaluate the vascular anatomy. Carotid stenosis measurements (when applicable) are obtained utilizing NASCET criteria, using the distal internal carotid diameter as the denominator. CONTRAST:  80mL OMNIPAQUE IOHEXOL 350 MG/ML SOLN COMPARISON:  03/04/2015 and 02/26/2014 head CT. 03/03/2015 brain MR. FINDINGS: CT HEAD Brain: Large left cerebellar infarct with hemorrhage better appreciated on recently performed MR. Mass-effect upon the adjacent structures including narrowing of the fourth ventricle and inferior aspect of the aqueduct has progressed slightly since the prior exam (series 201, image 10). Dilated lateral ventricles/ mild hydrocephalus similar to prior exam (best appreciated when compared to remote CT). Calvarium and skull base: Negative. Paranasal sinuses: Minimal mucosal thickening maxillary sinuses greatest inferior right maxillary sinus with slight polypoid opacification. Orbits: Mild exophthalmos. CTA NECK Aortic arch: 3 vessel arch. Right carotid system: Plaque right carotid bifurcation proximal right internal carotid artery with maximal 50%  diameter stenosis proximal right internal carotid artery. Left carotid system: Plaque left carotid bifurcation proximal left internal carotid artery with maximal 50% diameter stenosis proximal left internal carotid artery. Vertebral arteries:Right vertebral artery is slightly larger than left. No significant stenosis of the cervical segment of the vertebral arteries on either side. Plaque with mild narrowing right subclavian artery. Skeleton: Cervical spondylotic changes most notable C6-7. Other neck: No worrisome primary neck mass. Small to slightly moderate-size bilateral pleural effusions with adjacent passive atelectasis. Endotracheal tube and nasogastric tube in place. CTA HEAD Anterior circulation: Mild narrowing irregularity M1 segment left middle cerebral artery. Mild narrowing right middle cerebral artery bifurcation. Calcified plaque with mild narrowing cavernous segment internal carotid artery bilaterally. Posterior circulation: Narrowing of the left vertebral artery from the foramen magnum to formation of the basilar artery. Poor delineation of the left posterior inferior cerebellar artery. No significant stenosis of the distal right vertebral artery. Mild irregularity of the basilar artery without significant stenosis. Irregular anterior inferior cerebellar arteries bilaterally. Fetal type contribution to formation of the left posterior cerebral artery. No high-grade stenosis of the posterior cerebral artery however, distal branch vessel narrowing and irregularity greater on the right. No aneurysm noted. Venous sinuses: Patent. Anatomic variants: As above. Delayed phase: As above. IMPRESSION: CT HEAD Large left cerebellar infarct with hemorrhage better appreciated on recently performed MR. Mass-effect upon the adjacent structures including narrowing of the fourth ventricle and inferior aspect of the aqueduct has progressed slightly since the prior exam (series 201, image 10). Dilated lateral ventricles/  mild hydrocephalus similar to prior exam (best appreciated when compared to remote CT). CTA NECK Plaque carotid bifurcation / proximal internal  carotid artery bilaterally with maximal 50% diameter stenosis proximal internal carotid artery bilaterally. Right vertebral artery is slightly larger than left. No significant stenosis of the cervical segment of the vertebral arteries on either side. Small to slightly moderate-size bilateral pleural effusions with adjacent passive atelectasis. Endotracheal tube and nasogastric tube in place. CTA HEAD Mild narrowing irregularity M1 segment left middle cerebral artery. Mild narrowing right middle cerebral artery bifurcation. Calcified plaque with mild narrowing cavernous segment internal carotid artery bilaterally. Narrowing of the left vertebral artery from the foramen magnum to formation of the basilar artery. Poor delineation of the left posterior inferior cerebellar artery and appears occluded. No significant stenosis of the distal right vertebral artery. Mild irregularity of the basilar artery without significant stenosis. Irregular anterior inferior cerebellar arteries bilaterally. Fetal type contribution to formation of the left posterior cerebral artery. No high-grade stenosis of the posterior cerebral artery however, distal branch vessel narrowing and irregularity greater on the right. Electronically Signed   By: Lacy Duverney M.D.   On: 03/06/2015 08:04   Ct Angio Up Extrem Left W/cm &/or Wo/cm  02/27/2015  CLINICAL DATA:  82 year old female with left arm pain and heaviness. EXAM: CT ANGIOGRAPHY UPPER LEFT EXTREMITY TECHNIQUE: Transaxial CT of the left upper extremity from the level of the clavicle technique timed was performed in 3 mm slice thickness. Coronal and sagittal reformatted images were provided. The study was performed following intravenous administration of 75 cc Omnipaque 350 CONTRAST:  75mL OMNIPAQUE IOHEXOL 350 MG/ML SOLN COMPARISON:  Go technique  FINDINGS: Evaluation of the distal extremity vasculature is limited due to streak artifact caused by patient's body. The aortic arch and the origins of the great vessels appear unremarkable. There is segmental wall thickening with luminal narrowing of the left subclavian artery extending into the left axillary artery and proximal brachial artery. There is high-grade narrowing of the subclavian and axillary segment of the vessel. The distal brachial artery is patent. There is bifurcation of the brachial artery approximately 4.5 cm distal to the elbow. The proximal portion of the radial and ulnar artery appear patent. Evaluation of the distal portion of this vessel is very limited due to suboptimal opacification. There is however apparent diminished flow within the distal radial and ulna are artery concerning for underlying vascular disease. Correlation with clinical exam and duplex ultrasound recommended. The soft tissues of the left upper extremity appear unremarkable. There is no left axillary adenopathy. Linear left lung base atelectatic changes/scarring noted. A 13 mm indeterminate left adrenal nodule. Multiple left renal hypodense lesions, incompletely characterized, possibly parenchymal and parapelvic cysts. Ultrasound may provide better evaluation. There is diverticulosis of the visualized colon. No acute fracture. Review of the MIP images confirms the above findings. IMPRESSION: Segmental narrowing of the left subclavian, axillary, and proximal brachial arteries. Limited evaluation of the mid and distal portion of the radial and ulna arteries due to suboptimal opacification concerning for diminished flow related to underlying peripheral vascular disease. Correlation with clinical exam and further evaluation with duplex ultrasound recommended. Electronically Signed   By: Elgie Collard M.D.   On: 02/27/2015 23:55   Mr Laqueta Jean ZO Contrast  03/03/2015  CLINICAL DATA:  82 year old hypertensive female with  altered mental status and facial weakness. Subsequent encounter. EXAM: MRI HEAD WITHOUT AND WITH CONTRAST TECHNIQUE: Multiplanar, multiecho pulse sequences of the brain and surrounding structures were obtained without and with intravenous contrast. CONTRAST:  18mL MULTIHANCE GADOBENATE DIMEGLUMINE 529 MG/ML IV SOLN COMPARISON:  02/26/2014. FINDINGS: Large acute hemorrhagic  left mid to inferior cerebellar infarct with associated mass effect compressing the fourth ventricle. Interval slight increase in size of ventricles compared to recent CT consistent with developing hydrocephalus. Patient is un safe for lumbar puncture. Mild small vessel disease changes. No intracranial mass separate from above described findings. Fast technique imaging had to be utilized. Left vertebral artery is at least partially patent. Right vertebral artery, basilar artery and internal carotid arteries are patent. Limited assessment of the left posterior inferior cerebellar artery. 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 Interval slight increase in size of ventricles compared to recent CT consistent with developing hydrocephalus. Left vertebral artery is at least partially patent. Limited assessment of the left posterior inferior cerebellar artery. These results will be called to the ordering clinician or representative by the Radiologist Assistant, and communication documented in the PACS or zVision Dashboard. Electronically Signed   By: Lacy Duverney M.D.   On: 03/03/2015 16:55   Dg Chest Port 1 View  03/09/2015  CLINICAL DATA:  Shortness of breath. EXAM: PORTABLE CHEST 1 VIEW COMPARISON:  03/07/2015. FINDINGS: Mediastinum hilar structures normal. Cardiomegaly with normal pulmonary vascularity. Interval near complete clearing of bilateral pulmonary infiltrates/edema. Small right pleural effusion cannot be excluded. Left costophrenic angle  not imaged. No pneumothorax. IMPRESSION: Stable cardiomegaly with interim near complete resolution of bilateral pulmonary edema and/or infiltrates . Electronically Signed   By: Maisie Fus  Register   On: 03/09/2015 07:33   Dg Chest Port 1 View  03/07/2015  CLINICAL DATA:  Pulmonary edema. EXAM: PORTABLE CHEST 1 VIEW COMPARISON:  03/06/2015. FINDINGS: Interim extubation and removal of NG tube. Cardiomegaly with persistent bilateral pulmonary infiltrates suggesting pulmonary edema. Slight interim clearing. Persistent low lung volumes with basilar atelectasis. No prominent pleural effusion or pneumothorax. IMPRESSION: 1. Interim removal of endotracheal tube and NG tube. 2. Cardiomegaly with persistent bilateral pulmonary infiltrates consistent with pulmonary edema. Slight improvement from prior exam. Persistent low lung volumes with mild basilar atelectasis. Electronically Signed   By: Maisie Fus  Register   On: 03/07/2015 07:24   Dg Chest Port 1 View  03/06/2015  CLINICAL DATA:  Pneumonia. EXAM: PORTABLE CHEST 1 VIEW COMPARISON:  03/05/2015. FINDINGS: Endotracheal tube and NG tube in stable position. Cardiomegaly with diffuse bilateral pulmonary infiltrates again noted. Findings consistent with congestive heart failure. No change from prior exam. No prominent pleural effusion or pneumothorax . Degenerative changes thoracic spine. IMPRESSION: 1. Lines and tubes in stable position. 2. Congestive heart failure with bilateral pulmonary edema, no change from prior exam. Bilateral pneumonia cannot be excluded. Electronically Signed   By: Maisie Fus  Register   On: 03/06/2015 07:29   Dg Chest Port 1 View  03/05/2015  CLINICAL DATA:  Intubation. EXAM: PORTABLE CHEST 1 VIEW COMPARISON:  03/03/2015. FINDINGS: Endotracheal tube and NG tube in stable position. Cardiomegaly with diffuse bilateral pulmonary alveolar infiltrates, improved from prior exam. No pleural effusion or pneumothorax. IMPRESSION: 1. Lines and tubes in stable  position. 2. Cardiomegaly with bilateral pulmonary infiltrates consistent with pulmonary edema. Interim improvement from prior exam. Electronically Signed   By: Maisie Fus  Register   On: 03/05/2015 07:14   Portable Chest Xray  03/03/2015  CLINICAL DATA:  Admission chest radiograph. Known hemorrhagic infarct. Initial encounter. EXAM: PORTABLE CHEST 1 VIEW COMPARISON:  Chest radiograph performed earlier Hahn at 6:39 p.m. FINDINGS: The patient's endotracheal tube is seen ending 2-3 cm above the carina. An enteric tube is noted extending below the diaphragm.  Bilateral central airspace opacity raises concern for pulmonary edema. No pleural effusion or pneumothorax is seen. The cardiomediastinal silhouette is mildly enlarged. No acute osseous abnormalities are identified. IMPRESSION: 1. Endotracheal tube seen ending 2-3 cm above the carina. 2. Bilateral central airspace opacity is perhaps slightly worsened and raises concern for pulmonary edema. Mild cardiomegaly. Electronically Signed   By: Roanna Raider M.D.   On: 03/03/2015 23:00   Dg Chest Port 1 View  03/03/2015  CLINICAL DATA:  82 year old female being transferred to [facility] from [facility] , intubated and sedated now. Cough and shortness of breath. Initial encounter. EXAM: PORTABLE CHEST 1 VIEW COMPARISON:  [facility] 0022 hours Hahn. FINDINGS: Portable AP semi upright view at 1839 hours. Endotracheal tube tip in good position between the level the clavicles and carina. Enteric tube courses to the left upper quadrant, tip not included. Interval worsening confluence of right lung base opacity, favor lower lobe. Upper lobes appear stable. Left lung base is stable and probably unaffected. No superimposed pneumothorax or pulmonary edema. No pleural effusion identified. IMPRESSION: 1. Endotracheal tube tip in good position. Enteric tube courses to the abdomen. 2. Radiographic progression of right lung base pneumonia  and/or aspiration since earlier Hahn. No pleural effusion identified. Electronically Signed   By: Odessa Fleming M.D.   On: 03/03/2015 19:04     PERTINENT LAB RESULTS: CBC:  Recent Labs  03/11/15 0504 03/12/15 0440  WBC 9.3 7.9  HGB 8.9* 8.8*  HCT 27.5* 27.9*  PLT 396 418*   CMET CMP     Component Value Date/Time   NA 139 03/11/2015 1050   NA 140 02/26/2014 1327   K 4.0 03/11/2015 1050   K 4.6 02/26/2014 1327   CL 104 03/11/2015 1050   CL 107 02/26/2014 1327   CO2 26 03/11/2015 1050   CO2 25 02/26/2014 1327   GLUCOSE 149* 03/11/2015 1050   GLUCOSE 125* 02/26/2014 1327   BUN 38* 03/11/2015 1050   BUN 22* 02/26/2014 1327   CREATININE 0.97 03/11/2015 1050   CREATININE 1.19 02/26/2014 1327   CALCIUM 9.0 03/11/2015 1050   CALCIUM 8.9 02/26/2014 1327   PROT 5.7* 03/06/2015 0507   PROT 7.8 02/26/2014 1327   ALBUMIN 2.3* 03/06/2015 0507   ALBUMIN 3.7 02/26/2014 1327   AST 14* 03/06/2015 0507   AST 30 02/26/2014 1327   ALT 14 03/06/2015 0507   ALT 27 02/26/2014 1327   ALKPHOS 58 03/06/2015 0507   ALKPHOS 90 02/26/2014 1327   BILITOT 0.8 03/06/2015 0507   BILITOT 0.3 02/26/2014 1327   GFRNONAA 53* 03/11/2015 1050   GFRNONAA 46* 02/26/2014 1327   GFRAA >60 03/11/2015 1050   GFRAA 56* 02/26/2014 1327    GFR Estimated Creatinine Clearance: 51.5 mL/min (by C-G formula based on Cr of 0.97). No results for input(s): LIPASE, AMYLASE in the last 72 hours. No results for input(s): CKTOTAL, CKMB, CKMBINDEX, TROPONINI in the last 72 hours. Invalid input(s): POCBNP No results for input(s): DDIMER in the last 72 hours. No results for input(s): HGBA1C in the last 72 hours. No results for input(s): CHOL, HDL, LDLCALC, TRIG, CHOLHDL, LDLDIRECT in the last 72 hours. No results for input(s): TSH, T4TOTAL, T3FREE, THYROIDAB in the last 72 hours.  Invalid input(s): FREET3 No results for input(s): VITAMINB12, FOLATE, FERRITIN, TIBC, IRON, RETICCTPCT in the last 72 hours. Coags: No  results for input(s): INR in the last 72 hours.  Invalid input(s): PT Microbiology: Recent Results (from the  past 240 hour(s))  MRSA PCR Screening     Status: None   Collection Time: 03/03/15 11:01 PM  Result Value Ref Range Status   MRSA by PCR NEGATIVE NEGATIVE Final    Comment:        The GeneXpert MRSA Assay (FDA approved for NASAL specimens only), is one component of a comprehensive MRSA colonization surveillance program. It is not intended to diagnose MRSA infection nor to guide or monitor treatment for MRSA infections.   Culture, respiratory (NON-Expectorated)     Status: None   Collection Time: 03/04/15  8:31 AM  Result Value Ref Range Status   Specimen Description TRACHEAL ASPIRATE  Final   Special Requests Normal  Final   Gram Stain   Final    ABUNDANT WBC PRESENT, PREDOMINANTLY PMN NO SQUAMOUS EPITHELIAL CELLS SEEN NO ORGANISMS SEEN Performed at [facility]    Culture   Final    FEW YEAST CONSISTENT WITH CANDIDA SPECIES Performed at [facility]    Report Status 03/06/2015 FINAL  Final     BRIEF HOSPITAL COURSE:  Brief narrative 82 y/o woman who was admitted to [facility] on 1/18 w/ cold and painful LUE. 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 She was treated with heparin and then transitioned to Elliquis. On the morning of 1/21 she was unable to maintain her balance, slurred speech, and L facial droop. An MRI was was obtained which showed a large cerebral stroke w/ evidence of hemorrhage and concern for developing hydrocephalous. She was subsequently intubated, and transferred to [facility]. Once stabilized/extubated, transferred to [facility] hospitalist service on 1/26. Further hospital course was complicated by hematemesis-endoscopy showed a gastric ulcer.It is felt that her left subclavian artery thrombus/embolus and acute CVA were embolic events. Although TEE was initially contemplated and  attempted (canceled due to uncontrolled hypertension and lack of IV access was patent) after discussion with cardiology/neurology-it is felt that proceeding with TEE is not going to change management-as patient will require long-term anticoagulation. Gastroenterology has recommended to start anticoagulation 7 days from 03/11/15. See below for further details.  Hospital course by Problem List: Acute ischemic stroke with hemorrhagic conversion: Improved, no f see below for further detailsocal deficits seen on exam. MRI brain showed a Large acute hemorrhagic left mid to inferior cerebellar infarct with associated mass effect. CTA neck showed approximately 50% stenosis bilaterally. TTE 1/15 EF 55%,with  no obvious embolic source. Although initially TEE was contemplated-it is felt that it is no longer needed as it is not going to change management (Please note, TEE was canceled on 1/26 for uncontrolled hypertension, and was unable to be done on 1/30 due to lack of IV access). After discussion with neurology/cardiology-it is felt that the patient will require long-term anticoagulation for 2 documented embolic events (left subclavian artery embolism and CVA). neurology recommends starting anticoagulation with Eliquis. GI recommends starting anticoagulation in 1 week from 1/29-please start Eliquis 7 days from 03/11/15.Cardiology will arrange for outpatient 30 day event monitor. Continue with statin.LDL 56, A1c 6.6. Initially plans were to discharge to CIR, however upon further evaluation felt more appropriate for SNF.  Please note, this M.D.,spoke with patient's sister over the phone Nancy Hahn-explained recommendations from numerous subspecialists as noted above. I explained to her risks of life-threatening/life disabling bleeding on anticoagulation, I also explained to her risks of further embolic events that could be life-threatening/life disabling in the absence of anticoagulation. After much discussion, Nancy Hahn was agreeable to starting anticoagulation  Upper GI bleed: Resolved-EGD showed gastric ulcer-No further hematemesis. Hb stable. Since H. pylori antibody was elevated, GI recommending quadruple therapy for 14 days. Please ensure follow-up with gastroenterology on discharge.  Acute Blood loss anemia: mild ABLA-on top of anemia of chronic disease.Hb stable  Acute respiratory failure with hypoxia: Secondary to CVA, healthcare associated pneumonia. Required intubation, subsequently extubated on 1/23- now doing well on room air.  Healthcare associated pneumonia: Afebrile, improved. Has completed a course of antibiotics. Continue to monitor off antibiotics.  Acute diastolic heart failure: Compensated, treated with Lasix. Currently euvolemic and does not require any diuretics on discharge-please continue to monitor closely in the outpatient setting.  Hypertension: Uncontrolled over the past 2 days-but better controlled Hahn.Spoke with Dr. Pearlean Brownie, okay to start lowering blood pressure more aggressively-continue hydralazine 50 mg 3 times a day, Coreg 12.5 mg twice a day.Will need close follow-up in the outpatient setting while at SNF for further optimization  Ischemic left upper extremity: Secondary to acute left subclavian artery thrombosis-underwent status post embolectomy on 02/27/14. Was on anticoagulation with IV heparin, which was then changed to Eliquis. Unfortunately had a large ischemic stroke with hemorrhagic conversion, subsequently off all anticoagulation at present. Etiology likely paroxysmal atrial fibrillation.See above regarding Anticoagulation. Please ensure follow up with Vascular Surgery   Dyslipidemia: LDL 56, continue statin  Type 2 diabetes: Continue with SSI, suspect may require initiation of metformin on discharge.  Hahn-DAY OF DISCHARGE:  Subjective:   Nancy Hahn Hahn has no headache,no chest abdominal pain,no new weakness tingling or numbness.No further vomiting  or hematemesis  Objective:   Blood pressure 121/54, pulse 69, temperature 97.9 F (36.6 C), temperature source Oral, resp. rate 15, height 5\' 8"  (1.727 m), weight 83.507 kg (184 lb 1.6 oz), SpO2 99 %.  Intake/Output Summary (Last 24 hours) at 03/13/15 1100 Last data filed at 03/13/15 0839  Gross per 24 hour  Intake      0 ml  Output      0 ml  Net      0 ml   Filed Weights   03/09/15 0934 03/11/15 0500 03/12/15 0500  Weight: 86.138 kg (189 lb 14.4 oz) 84.46 kg (186 lb 3.2 oz) 83.507 kg (184 lb 1.6 oz)    Exam Awake Alert, Oriented *3, No new F.N deficits, Normal affect [place].AT,PERRAL Supple Neck,No JVD, No cervical lymphadenopathy appriciated.  Symmetrical Chest wall movement, Good air movement bilaterally, CTAB RRR,No Gallops,Rubs or new Murmurs, No Parasternal Heave +ve B.Sounds, Abd Soft, Non tender, No organomegaly appriciated, No rebound -guarding or rigidity. No Cyanosis, Clubbing or edema, No new Rash or bruise  DISCHARGE CONDITION: Stable  DISPOSITION: SNF  DISCHARGE INSTRUCTIONS:    Check CBGs before meals and at bedtime.  Activity:  As tolerated with Full fall precautions use walker/cane & assistance as needed  Get Medicines reviewed and adjusted: Please take all your medications with you for your next visit with your Primary MD  Please request your Primary MD to go over all hospital tests and procedure/radiological results at the follow up, please ask your Primary MD to get all Hospital records sent to his/her office.  If you experience worsening of your admission symptoms, develop shortness of breath, life threatening emergency, suicidal or homicidal thoughts you must seek medical attention immediately by calling 911 or calling your MD immediately  if symptoms less severe.  You must read complete instructions/literature along with all the possible adverse reactions/side effects for all the Medicines  you take and that have been prescribed to you. Take any new  Medicines after you have completely understood and accpet all the possible adverse reactions/side effects.   Do not drive when taking Pain medications.   Do not take more than prescribed Pain, Sleep and Anxiety Medications  Special Instructions: If you have smoked or chewed Tobacco  in the last 2 yrs please stop smoking, stop any regular Alcohol  and or any Recreational drug use.  Wear Seat belts while driving.  Please note  You were cared for by a hospitalist during your hospital stay. Once you are discharged, your primary care physician will handle any further medical issues. Please note that NO REFILLS for any discharge medications will be authorized once you are discharged, as it is imperative that you return to your primary care physician (or establish a relationship with a primary care physician if you do not have one) for your aftercare needs so that they can reassess your need for medications and monitor your lab values.   Diet recommendation: Diabetic Diet Heart Healthy diet   Discharge Instructions    Ambulatory referral to Neurology    Complete by:  As directed   Follow up with Dr. Roda Shutters in clinic at [facility] in about 2 months.     Diet - low sodium heart healthy    Complete by:  As directed      Diet - low sodium heart healthy    Complete by:  As directed      Diet Carb Modified    Complete by:  As directed      Increase activity slowly    Complete by:  As directed      Increase activity slowly    Complete by:  As directed            Follow-up Information    Follow up with BABAOFF, MARC E, MD. Schedule an appointment as soon as possible for a visit in 1 week.   Specialty:  Family Medicine   Why:  Hospital follow up,Hernettia stated  RN will call patient for APPT TIME   Contact information:   [address] [facility] [place] [address] [phone]       Follow up with Schnier, Latina Craver, MD. Schedule an appointment as  soon as possible for a visit on 03/19/2015.   Specialties:  Vascular Surgery, Cardiology, Radiology, Vascular Surgery   Why:  For wound re-check, Hospital follow up.Dawn  scheduled appt 03/19/15 at 13:45pm with Dr.G.Schnier   Contact information:   [address] [place] [address] [phone]       Follow up with [facility] .   Specialty:  Skilled Nursing Company secretary information:   [address] [place] [address] [phone]      Follow up with [facility] Office On 03/19/2015.   Specialty:  Cardiology   Why:  12:00 noon, for heart monitor hook up (event monitor)   Contact information:   [address], [address] [place] [address] [phone]      Follow up with Marily Lente, NP On 04/26/2015.   Specialty:  Cardiology   Why:  10:40AM   Contact information:   [address] [place] [address] [phone]       Schedule an appointment as soon as possible for a visit with Xu,Jindong, MD.   Specialty:  Neurology   Why:  stroke clinic   Contact information:   [address] [place] [address] [phone]       Follow up with Theda Belfast, MD. Schedule an appointment as soon as possible for a visit in 1 month.   Specialty:  Gastroenterology   Contact information:   [address] [place] [address] [phone]       Total Time spent on discharge equals 45 minutes.  SignedJeoffrey Massed 03/13/2015 11:00 AM

## 2015-03-09 NOTE — Progress Notes (Signed)
STROKE TEAM PROGRESS NOTE   HISTORY OF PRESENT ILLNESS Nancy Hahn is an 82 y.o. female with a history of a breast mass and hypertension who was admitted to [facility] on 02/26/2014 for acute left subclavian artery thrombosis. She underwent thrombectomy on 02/28/2015. Patient was on IV heparin initially and was changed to Eliquis. She became febrile overnight with a throat and coughing with chest x-ray findings consistent with aspiration pneumonia. She also developed a change in mental status and an MRI which is a large acute hemorrhagic left cerebellar infarction mass effect on fourth ventricle. A shunt had not developed hydrocephalus, however. She was intubated on mechanical ventilation and subsequently transferred to [facility] for further management. Reversal of anticoagulation with Eloquis was gone.  LSN: Unclear tPA Given: No: Acute hemorrhagic cerebral infarction in the setting of anticoagulation with Eliquis. mRankin:   SUBJECTIVE (INTERVAL HISTORY) She is neurologically stable.   family is  at bedside. She will get TEE  Today prior to discharge.She has done well. but is awake and following commands well.   OBJECTIVE Temp:  [97.5 F (36.4 C)-98.3 F (36.8 C)] 97.7 F (36.5 C) (01/27 1646) Pulse Rate:  [64-78] 76 (01/27 1646) Cardiac Rhythm:  [-] Normal sinus rhythm (01/27 0805) Resp:  [16] 16 (01/27 1646) BP: (155-185)/(61-81) 155/80 mmHg (01/27 1646) SpO2:  [93 %-98 %] 98 % (01/27 1646) Weight:  [189 lb 14.4 oz (86.138 kg)] 189 lb 14.4 oz (86.138 kg) (01/27 0934)  CBC:   Recent Labs Lab 03/07/15 0446 03/08/15 0657  WBC 8.7 8.4  NEUTROABS 7.2 6.3  HGB 10.9* 11.4*  HCT 32.6* 34.2*  MCV 89.8 89.5  PLT 299 314    Basic Metabolic Panel:   Recent Labs Lab 03/07/15 0446 03/08/15 0657 03/09/15 0454  NA 140 138 140  K 3.5 3.5 4.0  CL 97* 103 102  CO2 GLUCOSE 149* 122* 154*  BUN 13 22* 44*  CREATININE 0.84 0.86 1.06*  CALCIUM 8.5* 9.0  9.2  MG 1.9 2.1  --   PHOS 4.0 3.7  --     Lipid Panel:     Component Value Date/Time   CHOL 108 03/05/2015 0735   TRIG 104 03/06/2015 0507   HDL 22* 03/05/2015 0735   CHOLHDL 4.9 03/05/2015 0735   VLDL 30 03/05/2015 0735   LDLCALC 56 03/05/2015 0735   HgbA1c:  Lab Results  Component Value Date   HGBA1C 6.6* 02/27/2015   Urine Drug Screen: No results found for: LABOPIA, COCAINSCRNUR, LABBENZ, AMPHETMU, THCU, LABBARB    IMAGING  Dg Chest 1 View  03/03/2015   1. Patchy right basilar airspace opacity raises concern for pneumonia. The appearance is less typical for asymmetric pulmonary edema. Mild left perihilar opacity seen.  2. Underlying vascular congestion and mild cardiomegaly.    Dg Abd 1 View 03/03/2015   Enteric catheter with tip overlying the gastric body.      Mr Laqueta Jean Wo Contrast 03/03/2015   Large acute hemorrhagic left mid to inferior cerebellar infarct with associated mass effect compressing the fourth ventricle. Interval slight increase in size of ventricles compared to recent CT consistent with developing hydrocephalus. Left vertebral artery is at least partially patent. Limited assessment of the left posterior inferior cerebellar artery.      Portable Chest Xray 03/03/2015   1. Endotracheal tube seen ending 2-3 cm above the carina.  2. Bilateral central airspace opacity is perhaps slightly worsened and raises concern for pulmonary edema.  Mild cardiomegaly.   Dg Chest Port 1 View 03/03/2015   1. Endotracheal tube tip in good position. Enteric tube courses to the abdomen.  2. Radiographic progression of right lung base pneumonia and/or aspiration since earlier today. No pleural effusion identified.   PHYSICAL EXAM Frail elderly lady   . Afebrile. Head is nontraumatic. Neck is supple without bruit. Cardiac exam no murmur or gallop. Lungs are clear to auscultation. Distal pulses are well felt. Neurological Exam :  Awake and alert. PERRL. fundi not  visualized. Vision acuity and fields seem adequate. Extraocular movements are full range without nystagmus. Mild saccadic dysmetria on left gaze Face symmetric. Opens eyes to light stimulation.   Can follow simple commands, nods to questions. No gaze deviation. Does track. Conjugate gaze.No nystagmus. moves all extremities equally. Tongue midline. Cough and gag intact. No extremity drift or focal weakness. Mild left finger-to-nose dysmetria. Sensation appears intact bilaterally. Toes equivocal.  Gait deferred Gait: Unable to test      ASSESSMENT/PLAN Nancy Hahn is a 82 y.o. female with history of breast mass, hypertension, recent aspiration pneumonia, left subclavian artery thrombosis s/p   thrombectomy on 02/28/2015 and subsequent Eliquis therapy, presenting with altered mental status. She did not receive IV t-PA due to hemorrhage.   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 source  Resultant  No focal motor deficits but unable to assess balance and walking due to intubation  MRI  - Large acute hemorrhagic left mid to inferior cerebellar infarct with associated mass effect.  MRA - not performed  Carotid Doppler - not indicated  2D Echo - EF 55-60%. No cardiac source of emboli identified.  LDL - not performed  HgbA1c not performed  VTE prophylaxis - SCDs  Diet NPO time specified Diet - low sodium heart healthy Diet Carb Modified  aspirin 81 mg daily prior to admission, now on No antithrombotic secondary to hemorrhage.  Ongoing aggressive stroke risk factor management  Therapy recommendations: Pending  Disposition:  Pending  Hypertension  Blood pressure tends to run low   Other Stroke Risk Factors  Advanced age  Obesity, Body mass index is 28.88 kg/(m^2).    Other Active Problems  Anemia - monitor  Pneumonia - CCM following - on Zosyn and vancomycin - Day #3    Hospital day # 1   I personally examined  patient and images, and have participated in and made any corrections needed to history, physical, neuro exam,assessment and plan as stated above.  I have personally obtained the history, evaluated labs, reviewed imaging studies and agree with radiology interpretations. Patient was admitted to [facility] for acute subclavian artery thrombosis and thrombectomy, she was on IV heparin initially and was changed to Eliquis. She had a change in MS found to have hemorrhagic left cerebellar infarction with mass effect on fourth ventricle.   she is neurologically stable. . Recommend checking a TEE and loop recorder for etiology for her subclavian thrombosis.agree with more aggressive BP control   Delia Heady, MD [facility] Pager: [phone] 05/05/2014 5:09 PM     To contact Stroke Continuity provider, please refer to [email or web address]. After hours, contact General Neurology

## 2015-03-09 NOTE — Progress Notes (Signed)
Pt was scheduled for a TEE this afternoon at 3pm. Instructed to follow up by Md because pt is due for discharge to SNF. Called back to follow up spoke with Nancy Hahn  who stated that somehow pt was not put on the list for TEE. MD notified, pt to have TEE out patient. Will be discharged to SNF in the am. Her sister Nancy Hahn was called and notified.

## 2015-03-09 NOTE — Clinical Social Work Placement (Signed)
   CLINICAL SOCIAL WORK PLACEMENT  NOTE  Date:  03/09/2015  Patient Details  Name: Nancy Hahn MRN: [id number] Date of Birth: 23-Jun-1931  Clinical Social Work is seeking post-discharge placement for this patient at the Skilled  Nursing Facility level of care (*CSW will initial, date and re-position this form in  chart as items are completed):  Yes   Patient/family provided with [facility] Clinical Social Work Department's list of facilities offering this level of care within the geographic area requested by the patient (or if unable, by the patient's family).  Yes   Patient/family informed of their freedom to choose among providers that offer the needed level of care, that participate in Medicare, Medicaid or managed care program needed by the patient, have an available bed and are willing to accept the patient.  Yes   Patient/family informed of [facility]'s ownership interest in [facility] and [facility], as well as of the fact that they are under no obligation to receive care at these facilities.  PASRR submitted to EDS on       PASRR number received on       Existing PASRR number confirmed on 03/09/15     FL2 transmitted to all facilities in geographic area requested by pt/family on 03/09/15     FL2 transmitted to all facilities within larger geographic area on       Patient informed that his/her managed care company has contracts with or will negotiate with certain facilities, including the following:        Yes   Patient/family informed of bed offers received.  Patient chooses bed at  [facility] )     Physician recommends and patient chooses bed at      Patient to be transferred to  ([facility] ) on 03/09/15.  Patient to be transferred to facility by  [facility])     Patient family notified on 03/09/15 of transfer.  Name of family member notified:   (Pt's sister, [name] )     PHYSICIAN Please prepare priority discharge summary, including  medications     Additional Comment:    _______________________________________________ Vaughan Browner, LCSW 03/09/2015, 12:39 PM

## 2015-03-09 NOTE — Progress Notes (Signed)
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  pt with recent L Subclavian Artery thrombectomy on 02/28/15 and was put on blood thinners.  pt with hx of HTN and recent PNA.      PT Comments    Mobility limited by nausea. Pt reporting dizziness with mobility. She is NPO this AM in preparation for TEE. CIR denied pt based on not having 24-hour supervision/assist at home. D/C recommendation updated to SNF.  Follow Up Recommendations  SNF;Supervision/Assistance - 24 hour (Pt not approved for CIR.)     Equipment Recommendations  None recommended by PT    Recommendations for Other Services       Precautions / Restrictions Precautions Precautions: Fall    Mobility  Bed Mobility         Supine to sit: Min assist;HOB elevated     General bed mobility comments: Min assist to safely proceed to EOB. Pt with overexaggerated anterior propulsiion.  Transfers   Equipment used: 1 person hand held assist   Sit to Stand: Mod assist Stand pivot transfers: Mod assist       General transfer comment: Pt able to power up. Assist needed to stabilize initial balance and forward propulsion.  Ambulation/Gait Ambulation/Gait assistance: +2 physical assistance;Mod assist Ambulation Distance (Feet): 5 Feet Assistive device: 2 person hand held assist Gait Pattern/deviations: Ataxic Gait velocity: decreased   General Gait Details: Distance limited by nausea.   Stairs            Wheelchair Mobility    Modified Rankin (Stroke Patients Only) Modified Rankin (Stroke Patients Only) Pre-Morbid Rankin Score: No symptoms Modified Rankin: Severe disability     Balance   Sitting-balance support: No upper extremity supported;Feet supported Sitting balance-Leahy Scale: Poor     Standing balance support:  Bilateral upper extremity supported;During functional activity Standing balance-Leahy Scale: Poor                      Cognition Arousal/Alertness: Awake/alert Behavior During Therapy: Flat affect Overall Cognitive Status: Impaired/Different from baseline Area of Impairment: Safety/judgement;Problem solving     Memory: Decreased recall of precautions   Safety/Judgement: Decreased awareness of deficits;Decreased awareness of safety   Problem Solving: Difficulty sequencing;Requires verbal cues      Exercises      General Comments        Pertinent Vitals/Pain Pain Assessment: No/denies pain    Home Living                      Prior Function            PT Goals (current goals can now be found in the care plan section) Acute Rehab PT Goals Patient Stated Goal: To go home PT Goal Formulation: With patient Time For Goal Achievement: 03/21/15 Potential to Achieve Goals: Good Progress towards PT goals: Progressing toward goals    Frequency  Min 4X/week    PT Plan Discharge plan needs to be updated    Co-evaluation             End of Session Equipment Utilized During Treatment: Gait belt Activity Tolerance: Treatment limited secondary to medical complications (Comment) (nausea) Patient left: in chair;with family/visitor present;with call bell/phone within reach;with chair alarm set     Time: 4098-1191 PT Time Calculation (min) (ACUTE ONLY): 19  min  Charges:  $Therapeutic Activity: 8-22 mins                    G Codes:      Ilda Foil 03/09/2015, 12:13 PM

## 2015-03-09 NOTE — Progress Notes (Addendum)
Pt discharge cancelled.  Diet resumed.  TEE will be done outpatient per MD.  Will continue to monitor. Sondra Come, RN

## 2015-03-09 NOTE — Clinical Social Work Note (Signed)
Clinical Social Work Assessment  Patient Details  Name: Nancy Hahn MRN: [id number] Date of Birth: 07/07/1931  Date of referral:  03/09/15               Reason for consult:  Discharge Planning                Permission sought to share information with:  Case Manager, Family Supports, Customer service manager Permission granted to share information::  Yes, Verbal Permission Granted  Name::      Occupational psychologist)  Agency::   (SNF)  Relationship::   (Sister )  Contact Information:   [phone])  Housing/Transportation Living arrangements for the past 2 months:  Single Family Home Source of Information:  Patient Patient Interpreter Needed:  None Criminal Activity/Legal Involvement Pertinent to Current Situation/Hospitalization:  No - Comment as needed Significant Relationships:  Siblings Lives with:  Self Do you feel safe going back to the place where you live?  No Need for family participation in patient care:  No (Coment)  Care giving concerns:  Patient requiring short-term rehab at skilled level.    Social Worker assessment / plan:  Holiday representative met with patient and sister-in-law present at bedside. CSW introduced CSW role and SNF process. CSW reviewed and provided SNF list. PT recommending CIR however patient has expressed that she would like to be transitioned to SNF. Pt prefers [facility] and has been a resident in the past about 2 years ago. CSW has contacted pt's sister, Nancy Hahn and left a message in regards to discharge planning and to inform sister that pt will transition to SNF today after TEE. CSW awaiting returned phone call from sister. No further concerns reported by patient at this time. CSW remains available as needed.   Employment status:  Retired Nurse, adult PT Recommendations:  Inpatient [facility] / Referral to community resources:  [facility]  Patient/Family's Response to  care:  Pt a/o x 4. Pt agreeable to SNF placement at [facility]. Pt flat during assessment however expressed appreciation in social work intervention.   Patient/Family's Understanding of and Emotional Response to Diagnosis, Current Treatment, and Prognosis:  CSW unclear of pt understanding of medical work up.   Emotional Assessment Appearance:  Appears stated age Attitude/Demeanor/Rapport:  Apprehensive Affect (typically observed):  Flat, Pleasant, Accepting Orientation:  Oriented to Self, Oriented to Place, Oriented to  Time, Oriented to Situation Alcohol / Substance use:  Not Applicable Psych involvement (Current and /or in the community):  No (Comment)  Discharge Needs  Concerns to be addressed:  Basic Needs Readmission within the last 30 days:  No Current discharge risk:  Dependent with Mobility Barriers to Discharge:  Barriers Resolved   Glendon Axe, MSW, LCSWA [phone] 03/09/2015 2:26 PM

## 2015-03-09 NOTE — Progress Notes (Signed)
SLP Cancellation Note  Patient Details Name: Nancy Hahn MRN: [id number] DOB: Aug 29, 1931   Cancelled treatment:       Reason Eval/Treat Not Completed: Other (comment) (Patient NPO awaiting TEE)   Pablo Lawrence 03/09/2015, 4:43 PM   Angela Nevin, MA, CCC-SLP 03/09/2015 4:43 PM

## 2015-03-10 ENCOUNTER — Encounter (HOSPITAL_COMMUNITY): Payer: Self-pay | Admitting: *Deleted

## 2015-03-10 ENCOUNTER — Inpatient Hospital Stay (HOSPITAL_COMMUNITY): Payer: Commercial Managed Care - HMO

## 2015-03-10 ENCOUNTER — Encounter (HOSPITAL_COMMUNITY)
Admission: AD | Disposition: A | Discharge status: Skilled Nursing Facility | Payer: Self-pay | Source: Other Acute Inpatient Hospital | Attending: Internal Medicine

## 2015-03-10 DIAGNOSIS — K922 Gastrointestinal hemorrhage, unspecified: Secondary | ICD-10-CM

## 2015-03-10 DIAGNOSIS — I63112 Cerebral infarction due to embolism of left vertebral artery: Secondary | ICD-10-CM

## 2015-03-10 DIAGNOSIS — D62 Acute posthemorrhagic anemia: Secondary | ICD-10-CM

## 2015-03-10 DIAGNOSIS — K921 Melena: Secondary | ICD-10-CM

## 2015-03-10 DIAGNOSIS — K920 Hematemesis: Secondary | ICD-10-CM

## 2015-03-10 HISTORY — PX: ESOPHAGOGASTRODUODENOSCOPY: SHX5428

## 2015-03-10 LAB — HEMOGLOBIN AND HEMATOCRIT, BLOOD
HCT: 30.9 % — ABNORMAL LOW (ref 36.0–46.0)
Hemoglobin: 9.9 g/dL — ABNORMAL LOW (ref 12.0–15.0)

## 2015-03-10 LAB — GLUCOSE, CAPILLARY
GLUCOSE-CAPILLARY: 228 mg/dL — AB (ref 65–99)
Glucose-Capillary: 148 mg/dL — ABNORMAL HIGH (ref 65–99)
Glucose-Capillary: 135 mg/dL — ABNORMAL HIGH (ref 65–99)
Glucose-Capillary: 116 mg/dL — ABNORMAL HIGH (ref 65–99)

## 2015-03-10 LAB — VITAMIN B12: VITAMIN B 12: 472 pg/mL (ref 180–914)

## 2015-03-10 LAB — LIPID PANEL
Cholesterol: 144 mg/dL (ref 0–200)
HDL: 30 mg/dL — ABNORMAL LOW (ref >40)
LDL CALC: 88 mg/dL (ref 0–99)
Total CHOL/HDL Ratio: 4.8 RATIO
Triglycerides: 128 mg/dL (ref <150)
VLDL: 26 mg/dL (ref 0–40)

## 2015-03-10 LAB — TSH: TSH: 2.029 u[IU]/mL (ref 0.350–4.500)

## 2015-03-10 LAB — CBC
HEMATOCRIT: 29.8 % — AB (ref 36.0–46.0)
HEMOGLOBIN: 9.9 g/dL — AB (ref 12.0–15.0)
MCH: 29.8 pg (ref 26.0–34.0)
MCHC: 33.2 g/dL (ref 30.0–36.0)
MCV: 89.8 fL (ref 78.0–100.0)
Platelets: 422 10*3/uL — ABNORMAL HIGH (ref 150–400)
RBC: 3.32 MIL/uL — ABNORMAL LOW (ref 3.87–5.11)
RDW: 13.4 % (ref 11.5–15.5)
WBC: 10.1 10*3/uL (ref 4.0–10.5)

## 2015-03-10 SURGERY — EGD (ESOPHAGOGASTRODUODENOSCOPY)
Anesthesia: Moderate Sedation

## 2015-03-10 MED ORDER — FENTANYL CITRATE (PF) 100 MCG/2ML IJ SOLN
INTRAMUSCULAR | Status: AC
  Filled 2015-03-10: qty 2

## 2015-03-10 MED ORDER — BUTAMBEN-TETRACAINE-BENZOCAINE 2-2-14 % EX AERO
INHALATION_SPRAY | CUTANEOUS | Status: DC | PRN
  Administered 2015-03-10: 1 via TOPICAL

## 2015-03-10 MED ORDER — PANTOPRAZOLE SODIUM 40 MG IV SOLR
40.0000 mg | Freq: Two times a day (BID) | INTRAVENOUS | Status: DC
  Administered 2015-03-10 – 2015-03-13 (×7): 40 mg via INTRAVENOUS
  Filled 2015-03-10 (×7): qty 40

## 2015-03-10 MED ORDER — MIDAZOLAM HCL 10 MG/2ML IJ SOLN
INTRAMUSCULAR | Status: DC | PRN
Start: 2015-03-10 — End: 2015-03-10
  Administered 2015-03-10 (×2): 1 mg via INTRAVENOUS

## 2015-03-10 MED ORDER — MIDAZOLAM HCL 5 MG/ML IJ SOLN
INTRAMUSCULAR | Status: AC
  Filled 2015-03-10: qty 2

## 2015-03-10 MED ORDER — SODIUM CHLORIDE 0.9 % IV SOLN: INTRAVENOUS | Status: DC

## 2015-03-10 MED ORDER — SIMVASTATIN 40 MG PO TABS
40.0000 mg | ORAL_TABLET | Freq: Every day | ORAL | Status: DC
  Administered 2015-03-11 – 2015-03-13 (×3): 40 mg via ORAL
  Filled 2015-03-10 (×3): qty 1

## 2015-03-10 MED ORDER — SODIUM CHLORIDE 0.9 % IV SOLN
250.0000 mL | INTRAVENOUS | Status: DC | PRN
  Administered 2015-03-10: 250 mL via INTRAVENOUS

## 2015-03-10 MED ORDER — FENTANYL CITRATE (PF) 100 MCG/2ML IJ SOLN
INTRAMUSCULAR | Status: DC | PRN
  Administered 2015-03-10 (×2): 12.5 ug via INTRAVENOUS

## 2015-03-10 NOTE — Progress Notes (Signed)
Pt had a moderate to large hematemesis with large clots/chunk of blood. Oncall provider notified and order given for an H&H. Will continue to monitor

## 2015-03-10 NOTE — Op Note (Signed)
[facility] [address] [place], [address]   ENDOSCOPY PROCEDURE REPORT  PATIENT: Nancy Hahn, Nancy Hahn  MR#: [id number] BIRTHDATE: Mar 14, 1931 , 83  yrs. old GENDER: female ENDOSCOPIST: Nancy Dare, MD, [facility] REFERRED BY:  [facility], [facility] PROCEDURE DATE:  03/10/2015 PROCEDURE:  EGD, diagnostic ASA CLASS:     Class III INDICATIONS:  hematemesis. MEDICATIONS: Fentanyl 25 mcg IV, Versed 2 mg IV, Moderate sedation given from 1444 to1458 TOPICAL ANESTHETIC: Cetacaine Spray DESCRIPTION OF PROCEDURE: After the risks benefits and alternatives of the procedure were thoroughly explained, informed consent was obtained.  The Pentax Gastroscope [id number] endoscope was introduced through the mouth and advanced to the second portion of the duodenum , Without limitations.  The instrument was slowly withdrawn as the mucosa was fully examined.    STOMACH: A single non-bleeding and ulcer measuring 5 x 10mm in size with a flat pigmented covering was found in the gastric body. Moderate erosive gastritis (inflammation) was found in the gastric body.   The stomach otherwise appeared normal. ESOPHAGUS: The mucosa of the esophagus appeared normal. DUODENUM: The duodenal mucosa showed no abnormalities in the bulb and 2nd part of the duodenum.  Retroflexed views revealed no abnormalities.     The scope was then withdrawn from the patient and the procedure completed.  COMPLICATIONS: There were no immediate complications.  ENDOSCOPIC IMPRESSION: 1.   Single ulcer in the gastric body 2.   Erosive gastritis in the gastric body 3.   The EGD otherwise appeared normal  RECOMMENDATIONS: 1.  Avoid ASA/NSAIDS 2.  Continue PPI bid 3.  H. pylori Ab, treat if positive 4.  Drs. Mann & Elnoria Howard to assume GI care on Monday  eSigned:  Meryl Dare, MD, [facility] 03/10/2015 3:03 PM

## 2015-03-10 NOTE — Progress Notes (Signed)
PATIENT DETAILS Name: Nancy Hahn Age: 82 y.o. Sex: female Date of Birth: 01-01-1932 Admit Date: 03/03/2015 Admitting Physician Kalman Shan, MD WUJ:[id number], Lavada Mesi, MD  Brief narrative 82 y/o woman who was admitted to [facility] on 1/18 w/ cold and painful LUE. 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 She was treated with heparin and then transitioned to Elliquis. On the morning of 1/21 she was unable to maintain her balance, slurred speech, and L facial droop. An MRI was was obtained which showed a large cerebral stroke w/ evidence of hemorrhage and concern for developing hydrocephalous. She was subsequently intubated, and transferred to [facility]. Once stabilized/extubated, transferred to [facility] hospitalist service on 1/26.   Subjective: Awake, alert. No vomiting this morning. However had hematemesis overnight.  Assessment/Plan: Active Problems:  Acute ischemic stroke with hemorrhagic conversion: Improving, no focal deficits seen on exam. MRI brain showed a Large acute hemorrhagic left mid to inferior cerebellar infarct with associated mass effect. CTA neck showed approximately 50% stenosis bilaterally. TTE 1/15 EF 55%, no obvious embolic source.  Currently not on any antiplatelets given hemorrhagic conversion and now GI bleed. LDL 56, A1c 6.6. Initially plans were to discharge to CIR, however upon further evaluation felt more appropriate for SNF. Neurology recommends a TEE, and likely will get a 30 day event monitor on discharge per cardiology.   Upper GI bleed: Could have stress ulcers-place on PPI. Continue to avoid antiplatelets at this setting. GI consulted for EGD. Hemoglobin stable  Acute respiratory failure with hypoxia: Secondary to CVA, healthcare associated pneumonia. Required intubation, subsequently extubated on 1/23- now doing well on room air.  Healthcare associated pneumonia: Afebrile, improved.  Has completed a course of antibiotics. Continue to monitor off antibiotics.  Acute diastolic heart failure: Compensated.  Hypertension:  better controlled-was Uncontrolled over the past few days , spoke with Dr. Pearlean Brownie, okay to start lowering blood pressure more. Continue hydralazine and Coreg. Follow and adjust accordingly  Ischemic left upper extremity: Secondary to acute left subclavian artery thrombosis-underwent status post embolectomy on 02/27/14. Was on anticoagulation with IV heparin, which was then changed to Eliquis. Unfortunately had a large ischemic stroke with hemorrhagic conversion, subsequently off all anticoagulation at present. Plans are to proceed with TEE either inpatient or outpatient , and a event recorder as outpatient. Etiology likely paroxysmal atrial fibrillation.  Dyslipidemia: LDL 56, continue statin  Type 2 diabetes: Continue with SSI, suspect may require initiation of metformin on discharge.  Disposition: Remain inpatient-SNF early next week   Antimicrobial agents  See below  Anti-infectives    Start     Dose/Rate Route Frequency Ordered Stop   03/05/15 1000  levofloxacin (LEVAQUIN) IVPB 500 mg  Status:  Discontinued     500 mg 100 mL/hr over 60 Minutes Intravenous Every 24 hours 03/04/15 0203 03/04/15 0820   03/04/15 2200  vancomycin (VANCOCIN) IVPB 1000 mg/200 mL premix  Status:  Discontinued     1,000 mg 200 mL/hr over 60 Minutes Intravenous Every 12 hours 03/04/15 0846 03/06/15 1140   03/04/15 1000  piperacillin-tazobactam (ZOSYN) IVPB 3.375 g  Status:  Discontinued     3.375 g 12.5 mL/hr over 240 Minutes Intravenous Every 8 hours 03/04/15 0846 03/06/15 1140   03/04/15 1000  vancomycin (VANCOCIN) 1,500 mg in sodium chloride 0.9 % 500 mL IVPB     1,500 mg 250 mL/hr over  120 Minutes Intravenous  Once 03/04/15 0846 03/04/15 1200      DVT Prophylaxis: SCD's  Code Status: Full code   Family Communication None at  bedside  Procedures: None  CONSULTS:  cardiology, pulmonary/intensive care and neurology  Time spent 30 minutes-Greater than 50% of this time was spent in counseling, explanation of diagnosis, planning of further management, and coordination of care.  MEDICATIONS: Scheduled Meds: . carvedilol  6.25 mg Oral BID WC  . feeding supplement (ENSURE ENLIVE)  237 mL Oral BID BM  . hydrALAZINE  50 mg Oral 3 times per day  . insulin aspart  0-9 Units Subcutaneous TID WC  . pantoprazole (PROTONIX) IV  40 mg Intravenous Q12H  . simvastatin  20 mg Oral Daily   Continuous Infusions:  PRN Meds:.sodium chloride, hydrALAZINE, ondansetron (ZOFRAN) IV    PHYSICAL EXAM: Vital signs in last 24 hours: Filed Vitals:   03/09/15 2101 03/10/15 0300 03/10/15 0700 03/10/15 1033  BP: 157/65 145/69 124/66 112/55  Pulse: 81 79 80 80  Temp: 97.9 F (36.6 C) 97.9 F (36.6 C) 98.5 F (36.9 C) 97.5 F (36.4 C)  TempSrc: Oral Oral Oral Oral  Resp: Height:      Weight:      SpO2: 95% 97% 96% 96%    Weight change:  Filed Weights   03/07/15 0800 03/08/15 0427 03/09/15 0934  Weight: 87.6 kg (193 lb 2 oz) 84.142 kg (185 lb 8 oz) 86.138 kg (189 lb 14.4 oz)   Body mass index is 28.88 kg/(m^2).   Gen Exam: Awake and alert with clear speech.   Neck: Supple, No JVD.   Chest: B/L Clear.   CVS: S1 S2 Regular, no murmurs.  Abdomen: soft, BS +, non tender, non distended.  Extremities: no edema, lower extremities warm to touch Neurologic: Non Focal.   Skin: No Rash.   Wounds: N/A.    Intake/Output from previous day: No intake or output data in the 24 hours ending 03/10/15 1118   LAB RESULTS: CBC  Recent Labs Lab 03/03/15 2300 03/05/15 0055 03/06/15 0507 03/07/15 0446 03/08/15 0657 03/10/15 0401 03/10/15 0740  WBC 9.7 7.2 6.1 8.7 8.4  --  10.1  HGB 9.0* 9.9* 10.0* 10.9* 11.4* 9.9* 9.9*  HCT 27.3* 28.3* 29.9* 32.6* 34.2* 30.9* 29.8*  PLT 152 138* 212 299 314  --  422*  MCV  91.3 89.3 88.7 89.8 89.5  --  89.8  MCH 30.1 31.2 29.7 30.0 29.8  --  29.8  MCHC 33.0 35.0 33.4 33.4 33.3  --  33.2  RDW 13.6 13.3 13.4 13.4 13.2  --  13.4  LYMPHSABS 0.8 1.0 0.8 0.6* 1.2  --   --   MONOABS 0.4 0.3 0.3 0.6 0.6  --   --   EOSABS 0.0 0.3 0.2 0.3 0.3  --   --   BASOSABS 0.0 0.0 0.0 0.0 0.0  --   --     Chemistries   Recent Labs Lab 03/03/15 2300 03/05/15 0055 03/05/15 2020 03/06/15 0507 03/07/15 0446 03/08/15 0657 03/09/15 0454  NA 141 141 142 141 140 138 140  K 3.8 5.4* 3.9 3.0* 3.5 3.5 4.0  CL 109 108 106 106 97* 103 102  CO2 23 21* GLUCOSE 141* 119* 160* 180* 149* 122* 154*  BUN 20 24* 22* 20 13 22* 44*  CREATININE 0.85 0.84 0.83 0.67 0.84 0.86 1.06*  CALCIUM 8.2* 8.0* 8.2* 8.3* 8.5* 9.0  9.2  MG 1.9 2.5*  --  1.9 1.9 2.1  --     CBG:  Recent Labs Lab 03/09/15 0633 03/09/15 1119 03/09/15 1624 03/09/15 2145 03/10/15 0621  GLUCAP 144* 152* 133* 189* 148*    GFR Estimated Creatinine Clearance: 47.8 mL/min (by C-G formula based on Cr of 1.06).  Coagulation profile  Recent Labs Lab 03/03/15 2300  INR 1.66*    Cardiac Enzymes No results for input(s): CKMB, TROPONINI, MYOGLOBIN in the last 168 hours.  Invalid input(s): CK  Invalid input(s): POCBNP No results for input(s): DDIMER in the last 72 hours. No results for input(s): HGBA1C in the last 72 hours.  Recent Labs  03/10/15 0900  CHOL 144  HDL 30*  LDLCALC 88  TRIG 161  CHOLHDL 4.8    Recent Labs  03/10/15 0900  TSH 2.029    Recent Labs  03/10/15 0900  VITAMINB12 472   No results for input(s): LIPASE, AMYLASE in the last 72 hours.  Urine Studies No results for input(s): UHGB, CRYS in the last 72 hours.  Invalid input(s): UACOL, UAPR, USPG, UPH, UTP, UGL, UKET, UBIL, UNIT, UROB, ULEU, UEPI, UWBC, URBC, UBAC, CAST, UCOM, BILUA  MICROBIOLOGY: Recent Results (from the past 240 hour(s))  MRSA PCR Screening     Status: None   Collection Time: 03/03/15  11:01 PM  Result Value Ref Range Status   MRSA by PCR NEGATIVE NEGATIVE Final    Comment:        The GeneXpert MRSA Assay (FDA approved for NASAL specimens only), is one component of a comprehensive MRSA colonization surveillance program. It is not intended to diagnose MRSA infection nor to guide or monitor treatment for MRSA infections.   Culture, respiratory (NON-Expectorated)     Status: None   Collection Time: 03/04/15  8:31 AM  Result Value Ref Range Status   Specimen Description TRACHEAL ASPIRATE  Final   Special Requests Normal  Final   Gram Stain   Final    ABUNDANT WBC PRESENT, PREDOMINANTLY PMN NO SQUAMOUS EPITHELIAL CELLS SEEN NO ORGANISMS SEEN Performed at [facility]    Culture   Final    FEW YEAST CONSISTENT WITH CANDIDA SPECIES Performed at [facility]    Report Status 03/06/2015 FINAL  Final    RADIOLOGY STUDIES/RESULTS: Ct Angio Head W/cm &/or Wo Cm  03/06/2015  CLINICAL DATA:  82 year old female with off balance, slurred speech, altered mental status and left facial droop. Left cerebellar infarct. Subsequent encounter. EXAM: CT ANGIOGRAPHY HEAD AND NECK TECHNIQUE: Multidetector CT imaging of the head and neck was performed using the standard protocol during bolus administration of intravenous contrast. Multiplanar CT image reconstructions and MIPs were obtained to evaluate the vascular anatomy. Carotid stenosis measurements (when applicable) are obtained utilizing NASCET criteria, using the distal internal carotid diameter as the denominator. CONTRAST:  80mL OMNIPAQUE IOHEXOL 350 MG/ML SOLN COMPARISON:  03/04/2015 and 02/26/2014 head CT. 03/03/2015 brain MR. FINDINGS: CT HEAD Brain: Large left cerebellar infarct with hemorrhage better appreciated on recently performed MR. Mass-effect upon the adjacent structures including narrowing of the fourth ventricle and inferior aspect of the aqueduct has progressed slightly since the prior exam (series 201,  image 10). Dilated lateral ventricles/ mild hydrocephalus similar to prior exam (best appreciated when compared to remote CT). Calvarium and skull base: Negative. Paranasal sinuses: Minimal mucosal thickening maxillary sinuses greatest inferior right maxillary sinus with slight polypoid opacification. Orbits: Mild exophthalmos. CTA NECK Aortic arch: 3 vessel arch. Right carotid system:  Plaque right carotid bifurcation proximal right internal carotid artery with maximal 50% diameter stenosis proximal right internal carotid artery. Left carotid system: Plaque left carotid bifurcation proximal left internal carotid artery with maximal 50% diameter stenosis proximal left internal carotid artery. Vertebral arteries:Right vertebral artery is slightly larger than left. No significant stenosis of the cervical segment of the vertebral arteries on either side. Plaque with mild narrowing right subclavian artery. Skeleton: Cervical spondylotic changes most notable C6-7. Other neck: No worrisome primary neck mass. Small to slightly moderate-size bilateral pleural effusions with adjacent passive atelectasis. Endotracheal tube and nasogastric tube in place. CTA HEAD Anterior circulation: Mild narrowing irregularity M1 segment left middle cerebral artery. Mild narrowing right middle cerebral artery bifurcation. Calcified plaque with mild narrowing cavernous segment internal carotid artery bilaterally. Posterior circulation: Narrowing of the left vertebral artery from the foramen magnum to formation of the basilar artery. Poor delineation of the left posterior inferior cerebellar artery. No significant stenosis of the distal right vertebral artery. Mild irregularity of the basilar artery without significant stenosis. Irregular anterior inferior cerebellar arteries bilaterally. Fetal type contribution to formation of the left posterior cerebral artery. No high-grade stenosis of the posterior cerebral artery however, distal branch  vessel narrowing and irregularity greater on the right. No aneurysm noted. Venous sinuses: Patent. Anatomic variants: As above. Delayed phase: As above. IMPRESSION: CT HEAD Large left cerebellar infarct with hemorrhage better appreciated on recently performed MR. Mass-effect upon the adjacent structures including narrowing of the fourth ventricle and inferior aspect of the aqueduct has progressed slightly since the prior exam (series 201, image 10). Dilated lateral ventricles/ mild hydrocephalus similar to prior exam (best appreciated when compared to remote CT). CTA NECK Plaque carotid bifurcation / proximal internal carotid artery bilaterally with maximal 50% diameter stenosis proximal internal carotid artery bilaterally. Right vertebral artery is slightly larger than left. No significant stenosis of the cervical segment of the vertebral arteries on either side. Small to slightly moderate-size bilateral pleural effusions with adjacent passive atelectasis. Endotracheal tube and nasogastric tube in place. CTA HEAD Mild narrowing irregularity M1 segment left middle cerebral artery. Mild narrowing right middle cerebral artery bifurcation. Calcified plaque with mild narrowing cavernous segment internal carotid artery bilaterally. Narrowing of the left vertebral artery from the foramen magnum to formation of the basilar artery. Poor delineation of the left posterior inferior cerebellar artery and appears occluded. No significant stenosis of the distal right vertebral artery. Mild irregularity of the basilar artery without significant stenosis. Irregular anterior inferior cerebellar arteries bilaterally. Fetal type contribution to formation of the left posterior cerebral artery. No high-grade stenosis of the posterior cerebral artery however, distal branch vessel narrowing and irregularity greater on the right. Electronically Signed   By: Lacy Duverney M.D.   On: 03/06/2015 08:04   Dg Chest 1 View  03/03/2015   CLINICAL DATA:  Acute onset of hypoxia. Cough and shortness of breath. Insert macro EXAM: CHEST 1 VIEW COMPARISON:  Chest radiograph performed 02/27/2015 FINDINGS: The lungs are well-aerated. Patchy right basilar airspace opacity raises concern for pneumonia. The appearance is less typical for asymmetric pulmonary edema. Underlying vascular congestion is noted. Mild left perihilar opacity is seen. No pleural effusion or pneumothorax is identified. The cardiomediastinal silhouette is mildly enlarged. No acute osseous abnormalities are seen. IMPRESSION: 1. Patchy right basilar airspace opacity raises concern for pneumonia. The appearance is less typical for asymmetric pulmonary edema. Mild left perihilar opacity seen. 2. Underlying vascular congestion and mild cardiomegaly. Electronically Signed   By: Leotis Shames  Chang M.D.   On: 03/03/2015 01:06   Dg Chest 2 View  02/27/2015  CLINICAL DATA:  Acute onset left arm pain at 4:30 p.m. today. Initial encounter. EXAM: CHEST  2 VIEW COMPARISON:  None. FINDINGS: Calcified granuloma on the right is noted. The lungs are otherwise clear. Heart size is normal. No pneumothorax or pleural effusion. Thoracic spondylosis is identified. IMPRESSION: No acute abnormality. Electronically Signed   By: Drusilla Kanner M.D.   On: 02/27/2015 20:18   Dg Abd 1 View  03/03/2015  CLINICAL DATA:  Status post enteric catheter placement. EXAM: ABDOMEN - 1 VIEW COMPARISON:  None. FINDINGS: Enteric catheter seen with tip overlying gastric body. The bowel gas pattern is normal. No radiographic evidence of organomegaly. No radio-opaque calculi or other significant radiographic abnormality are seen. IMPRESSION: Enteric catheter with tip overlying the gastric body. Electronically Signed   By: Ted Mcalpine M.D.   On: 03/03/2015 19:05   Ct Head Wo Contrast  03/04/2015  CLINICAL DATA:  Known left cerebellar infarct, evaluate for hydrocephalus EXAM: CT HEAD WITHOUT CONTRAST TECHNIQUE:  Contiguous axial images were obtained from the base of the skull through the vertex without intravenous contrast. COMPARISON:  03/03/2015 FINDINGS: Bony calvarium is intact. There are again noted changes consistent with a left cerebellar infarct with mass-effect upon the fourth ventricle and pons. The lateral ventricles arm more prominent than that seen on a prior exam from 02/26/2014 but relatively stable from the recent MRI examination from the previous day. No acute hemorrhage is identified. No other focal abnormality is seen. IMPRESSION: Left cerebellar infarct with evidence of mild ventricular dilatation stable from the previous day. Electronically Signed   By: Alcide Clever M.D.   On: 03/04/2015 09:07   Ct Angio Neck W/cm &/or Wo/cm  03/06/2015  CLINICAL DATA:  82 year old female with off balance, slurred speech, altered mental status and left facial droop. Left cerebellar infarct. Subsequent encounter. EXAM: CT ANGIOGRAPHY HEAD AND NECK TECHNIQUE: Multidetector CT imaging of the head and neck was performed using the standard protocol during bolus administration of intravenous contrast. Multiplanar CT image reconstructions and MIPs were obtained to evaluate the vascular anatomy. Carotid stenosis measurements (when applicable) are obtained utilizing NASCET criteria, using the distal internal carotid diameter as the denominator. CONTRAST:  80mL OMNIPAQUE IOHEXOL 350 MG/ML SOLN COMPARISON:  03/04/2015 and 02/26/2014 head CT. 03/03/2015 brain MR. FINDINGS: CT HEAD Brain: Large left cerebellar infarct with hemorrhage better appreciated on recently performed MR. Mass-effect upon the adjacent structures including narrowing of the fourth ventricle and inferior aspect of the aqueduct has progressed slightly since the prior exam (series 201, image 10). Dilated lateral ventricles/ mild hydrocephalus similar to prior exam (best appreciated when compared to remote CT). Calvarium and skull base: Negative. Paranasal  sinuses: Minimal mucosal thickening maxillary sinuses greatest inferior right maxillary sinus with slight polypoid opacification. Orbits: Mild exophthalmos. CTA NECK Aortic arch: 3 vessel arch. Right carotid system: Plaque right carotid bifurcation proximal right internal carotid artery with maximal 50% diameter stenosis proximal right internal carotid artery. Left carotid system: Plaque left carotid bifurcation proximal left internal carotid artery with maximal 50% diameter stenosis proximal left internal carotid artery. Vertebral arteries:Right vertebral artery is slightly larger than left. No significant stenosis of the cervical segment of the vertebral arteries on either side. Plaque with mild narrowing right subclavian artery. Skeleton: Cervical spondylotic changes most notable C6-7. Other neck: No worrisome primary neck mass. Small to slightly moderate-size bilateral pleural effusions with adjacent passive atelectasis. Endotracheal tube  and nasogastric tube in place. CTA HEAD Anterior circulation: Mild narrowing irregularity M1 segment left middle cerebral artery. Mild narrowing right middle cerebral artery bifurcation. Calcified plaque with mild narrowing cavernous segment internal carotid artery bilaterally. Posterior circulation: Narrowing of the left vertebral artery from the foramen magnum to formation of the basilar artery. Poor delineation of the left posterior inferior cerebellar artery. No significant stenosis of the distal right vertebral artery. Mild irregularity of the basilar artery without significant stenosis. Irregular anterior inferior cerebellar arteries bilaterally. Fetal type contribution to formation of the left posterior cerebral artery. No high-grade stenosis of the posterior cerebral artery however, distal branch vessel narrowing and irregularity greater on the right. No aneurysm noted. Venous sinuses: Patent. Anatomic variants: As above. Delayed phase: As above. IMPRESSION: CT HEAD  Large left cerebellar infarct with hemorrhage better appreciated on recently performed MR. Mass-effect upon the adjacent structures including narrowing of the fourth ventricle and inferior aspect of the aqueduct has progressed slightly since the prior exam (series 201, image 10). Dilated lateral ventricles/ mild hydrocephalus similar to prior exam (best appreciated when compared to remote CT). CTA NECK Plaque carotid bifurcation / proximal internal carotid artery bilaterally with maximal 50% diameter stenosis proximal internal carotid artery bilaterally. Right vertebral artery is slightly larger than left. No significant stenosis of the cervical segment of the vertebral arteries on either side. Small to slightly moderate-size bilateral pleural effusions with adjacent passive atelectasis. Endotracheal tube and nasogastric tube in place. CTA HEAD Mild narrowing irregularity M1 segment left middle cerebral artery. Mild narrowing right middle cerebral artery bifurcation. Calcified plaque with mild narrowing cavernous segment internal carotid artery bilaterally. Narrowing of the left vertebral artery from the foramen magnum to formation of the basilar artery. Poor delineation of the left posterior inferior cerebellar artery and appears occluded. No significant stenosis of the distal right vertebral artery. Mild irregularity of the basilar artery without significant stenosis. Irregular anterior inferior cerebellar arteries bilaterally. Fetal type contribution to formation of the left posterior cerebral artery. No high-grade stenosis of the posterior cerebral artery however, distal branch vessel narrowing and irregularity greater on the right. Electronically Signed   By: Lacy Duverney M.D.   On: 03/06/2015 08:04   Ct Angio Up Extrem Left W/cm &/or Wo/cm  02/27/2015  CLINICAL DATA:  82 year old female with left arm pain and heaviness. EXAM: CT ANGIOGRAPHY UPPER LEFT EXTREMITY TECHNIQUE: Transaxial CT of the left upper  extremity from the level of the clavicle technique timed was performed in 3 mm slice thickness. Coronal and sagittal reformatted images were provided. The study was performed following intravenous administration of 75 cc Omnipaque 350 CONTRAST:  75mL OMNIPAQUE IOHEXOL 350 MG/ML SOLN COMPARISON:  Go technique FINDINGS: Evaluation of the distal extremity vasculature is limited due to streak artifact caused by patient's body. The aortic arch and the origins of the great vessels appear unremarkable. There is segmental wall thickening with luminal narrowing of the left subclavian artery extending into the left axillary artery and proximal brachial artery. There is high-grade narrowing of the subclavian and axillary segment of the vessel. The distal brachial artery is patent. There is bifurcation of the brachial artery approximately 4.5 cm distal to the elbow. The proximal portion of the radial and ulnar artery appear patent. Evaluation of the distal portion of this vessel is very limited due to suboptimal opacification. There is however apparent diminished flow within the distal radial and ulna are artery concerning for underlying vascular disease. Correlation with clinical exam and duplex ultrasound recommended.  The soft tissues of the left upper extremity appear unremarkable. There is no left axillary adenopathy. Linear left lung base atelectatic changes/scarring noted. A 13 mm indeterminate left adrenal nodule. Multiple left renal hypodense lesions, incompletely characterized, possibly parenchymal and parapelvic cysts. Ultrasound may provide better evaluation. There is diverticulosis of the visualized colon. No acute fracture. Review of the MIP images confirms the above findings. IMPRESSION: Segmental narrowing of the left subclavian, axillary, and proximal brachial arteries. Limited evaluation of the mid and distal portion of the radial and ulna arteries due to suboptimal opacification concerning for diminished flow  related to underlying peripheral vascular disease. Correlation with clinical exam and further evaluation with duplex ultrasound recommended. Electronically Signed   By: Elgie Collard M.D.   On: 02/27/2015 23:55   Mr Laqueta Jean ZO Contrast  03/03/2015  CLINICAL DATA:  82 year old hypertensive female with altered mental status and facial weakness. Subsequent encounter. EXAM: MRI HEAD WITHOUT AND WITH CONTRAST TECHNIQUE: Multiplanar, multiecho pulse sequences of the brain and surrounding structures were obtained without and with intravenous contrast. CONTRAST:  18mL MULTIHANCE GADOBENATE DIMEGLUMINE 529 MG/ML IV SOLN COMPARISON:  02/26/2014. 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 Interval slight increase in size of ventricles compared to recent CT consistent with developing hydrocephalus. Patient is un safe for lumbar puncture. Mild small vessel disease changes. No intracranial mass separate from above described findings. Fast technique imaging had to be utilized. Left vertebral artery is at least partially patent. Right vertebral artery, basilar artery and internal carotid arteries are patent. Limited assessment of the left posterior inferior cerebellar artery. 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 Interval slight increase in size of ventricles compared to recent CT consistent with developing hydrocephalus. Left vertebral artery is at least partially patent. Limited assessment of the left posterior inferior cerebellar artery. These results will be called to the ordering clinician or representative by the Radiologist Assistant, and communication documented in the PACS or zVision Dashboard. Electronically Signed   By: Lacy Duverney M.D.   On: 03/03/2015 16:55   Dg Chest Port 1 View  03/09/2015  CLINICAL DATA:  Shortness  of breath. EXAM: PORTABLE CHEST 1 VIEW COMPARISON:  03/07/2015. FINDINGS: Mediastinum hilar structures normal. Cardiomegaly with normal pulmonary vascularity. Interval near complete clearing of bilateral pulmonary infiltrates/edema. Small right pleural effusion cannot be excluded. Left costophrenic angle not imaged. No pneumothorax. IMPRESSION: Stable cardiomegaly with interim near complete resolution of bilateral pulmonary edema and/or infiltrates . Electronically Signed   By: Maisie Fus  Register   On: 03/09/2015 07:33   Dg Chest Port 1 View  03/07/2015  CLINICAL DATA:  Pulmonary edema. EXAM: PORTABLE CHEST 1 VIEW COMPARISON:  03/06/2015. FINDINGS: Interim extubation and removal of NG tube. Cardiomegaly with persistent bilateral pulmonary infiltrates suggesting pulmonary edema. Slight interim clearing. Persistent low lung volumes with basilar atelectasis. No prominent pleural effusion or pneumothorax. IMPRESSION: 1. Interim removal of endotracheal tube and NG tube. 2. Cardiomegaly with persistent bilateral pulmonary infiltrates consistent with pulmonary edema. Slight improvement from prior exam. Persistent low lung volumes with mild basilar atelectasis. Electronically Signed   By: Maisie Fus  Register   On: 03/07/2015 07:24   Dg Chest Port 1 View  03/06/2015  CLINICAL DATA:  Pneumonia. EXAM: PORTABLE CHEST 1 VIEW COMPARISON:  03/05/2015. FINDINGS: Endotracheal tube and NG tube in stable position. Cardiomegaly with diffuse bilateral pulmonary infiltrates again noted. Findings consistent  with congestive heart failure. No change from prior exam. No prominent pleural effusion or pneumothorax . Degenerative changes thoracic spine. IMPRESSION: 1. Lines and tubes in stable position. 2. Congestive heart failure with bilateral pulmonary edema, no change from prior exam. Bilateral pneumonia cannot be excluded. Electronically Signed   By: Maisie Fus  Register   On: 03/06/2015 07:29   Dg Chest Port 1 View  03/05/2015  CLINICAL  DATA:  Intubation. EXAM: PORTABLE CHEST 1 VIEW COMPARISON:  03/03/2015. FINDINGS: Endotracheal tube and NG tube in stable position. Cardiomegaly with diffuse bilateral pulmonary alveolar infiltrates, improved from prior exam. No pleural effusion or pneumothorax. IMPRESSION: 1. Lines and tubes in stable position. 2. Cardiomegaly with bilateral pulmonary infiltrates consistent with pulmonary edema. Interim improvement from prior exam. Electronically Signed   By: Maisie Fus  Register   On: 03/05/2015 07:14   Portable Chest Xray  03/03/2015  CLINICAL DATA:  Admission chest radiograph. Known hemorrhagic infarct. Initial encounter. EXAM: PORTABLE CHEST 1 VIEW COMPARISON:  Chest radiograph performed earlier today at 6:39 p.m. FINDINGS: The patient's endotracheal tube is seen ending 2-3 cm above the carina. An enteric tube is noted extending below the diaphragm. Bilateral central airspace opacity raises concern for pulmonary edema. No pleural effusion or pneumothorax is seen. The cardiomediastinal silhouette is mildly enlarged. No acute osseous abnormalities are identified. IMPRESSION: 1. Endotracheal tube seen ending 2-3 cm above the carina. 2. Bilateral central airspace opacity is perhaps slightly worsened and raises concern for pulmonary edema. Mild cardiomegaly. Electronically Signed   By: Roanna Raider M.D.   On: 03/03/2015 23:00   Dg Chest Port 1 View  03/03/2015  CLINICAL DATA:  82 year old female being transferred to [facility] from [facility] , intubated and sedated now. Cough and shortness of breath. Initial encounter. EXAM: PORTABLE CHEST 1 VIEW COMPARISON:  [facility] 0022 hours today. FINDINGS: Portable AP semi upright view at 1839 hours. Endotracheal tube tip in good position between the level the clavicles and carina. Enteric tube courses to the left upper quadrant, tip not included. Interval worsening confluence of right lung base opacity, favor lower lobe.  Upper lobes appear stable. Left lung base is stable and probably unaffected. No superimposed pneumothorax or pulmonary edema. No pleural effusion identified. IMPRESSION: 1. Endotracheal tube tip in good position. Enteric tube courses to the abdomen. 2. Radiographic progression of right lung base pneumonia and/or aspiration since earlier today. No pleural effusion identified. Electronically Signed   By: Odessa Fleming M.D.   On: 03/03/2015 19:04    Jeoffrey Massed, MD  [facility] Pager:[phone]  If 7PM-7AM, please contact night-coverage [email or web address] Password [email or web address] 03/10/2015, 11:18 AM   LOS: 7 days

## 2015-03-10 NOTE — H&P (View-Only) (Signed)
Gastroenterology Consultation                                                           covering for Drs. Mann & Hung    Referring Provider: No ref. provider found Primary Care Physician:  BABAOFF, MARC E, MD Primary Gastroenterologist:  Unassigned;  covering for Mann/Hung  Reason for Consultation:  Hematemesis  HPI: Nancy Hahn is a 82 y.o. female with a history of a breast mass and hypertension who was admitted to [facility] on 02/26/2014 for acute left subclavian artery thrombosis. She underwent thrombectomy on 02/28/2015. Patient was on IV heparin initially and was changed to Eliquis. She became febrile overnight with coughing with chest x-ray findings consistent with aspiration pneumonia. She also developed a change in mental status and an MRI which is a large acute hemorrhagic left cerebellar infarction mass effect on fourth ventricle. A shunt had not developed hydrocephalus, however. She was intubated on mechanical ventilation and subsequently transferred to [facility] for further management.  Was to be discharged to rehab yesterday but was supposed to have a TEE, which was not done (not put on the schedule or something) so discharge was held.  Then, overnight pt had a moderate to large hematemesis with large blood clots.  Prior to that she had a small amount of brown liquid vomit.  No further episodes this AM.  No black stools or bloody stools.  Patient denies nausea or abdominal pain.  Just started on pantoprazole 40 mg IV BID this AM.   Past Medical History  Diagnosis Date  . Breast mass   . Hypertension     Past Surgical History  Procedure Laterality Date  . Breast surgery  1980    biopsy  . Dilation and curettage of uterus    . Foot surgery    . Peripheral vascular catheterization Left 02/28/2015    Procedure: Upper Extremity Angiography;  Surgeon: Gregory G Schnier, MD;  Location: [facility]  INVASIVE CV LAB;  Service: Cardiovascular;  Laterality: Left;  . Peripheral vascular catheterization  02/28/2015    Procedure: Upper Extremity Intervention;  Surgeon: Gregory G Schnier, MD;  Location: [facility] INVASIVE CV LAB;  Service: Cardiovascular;;  . Embolectomy Left 02/28/2015    Procedure: EMBOLECTOMY ;  Surgeon: Gregory G Schnier, MD;  Location: [facility] ORS;  Service: Vascular;  Laterality: Left;    Prior to Admission medications   Medication Sig Start Date End Date Taking? Authorizing Provider  aspirin 81 MG tablet Take 81 mg by mouth daily.   Yes Historical Provider, MD  atenolol (TENORMIN) 50 MG tablet Take 50 mg by mouth daily. 04/07/12  Yes Historical Provider, MD  NIFEdipine (PROCARDIA XL/ADALAT-CC) 90 MG 24 hr tablet Take 90 mg by mouth daily. 04/06/12  Yes Historical Provider, MD  simvastatin (ZOCOR) 20 MG tablet Take 20 mg by mouth daily. 04/07/12  Yes Historical Provider, MD  carvedilol (COREG) 12.5 MG tablet Take 1 tablet (12.5 mg total) by mouth 2 (two) times daily with a meal. 03/09/15   Shanker M Ghimire, MD  feeding supplement, ENSURE ENLIVE, (ENSURE ENLIVE) LIQD Take 237 mLs by mouth 2 (two) times daily between meals. 03/09/15   Shanker M Ghimire, MD  hydrALAZINE (APRESOLINE) 50 MG tablet Take 1 tablet (50 mg total) by mouth every 8 (eight)   hours. 03/09/15   Shanker M Ghimire, MD  insulin aspart (NOVOLOG) 100 UNIT/ML injection 0-9 Units, Subcutaneous, 3 times daily with meals CBG < 70: implement hypoglycemia protocol CBG 70 - 120: 0 units CBG 121 - 150: 1 unit CBG 151 - 200: 2 units CBG 201 - 250: 3 units CBG 251 - 300: 5 units CBG 301 - 350: 7 units CBG 351 - 400: 9 units CBG > 400: call MD 03/09/15   Shanker M Ghimire, MD  lisinopril (PRINIVIL,ZESTRIL) 10 MG tablet Take 1 tablet (10 mg total) by mouth daily. 03/09/15   Shanker M Ghimire, MD  metFORMIN (GLUCOPHAGE) 500 MG tablet Take 1 tablet (500 mg total) by mouth 2 (two) times daily with a meal. 03/09/15   Shanker M Ghimire, MD     Current Facility-Administered Medications  Medication Dose Route Frequency Provider Last Rate Last Dose  . 0.9 %  sodium chloride infusion  250 mL Intravenous PRN Aaron Trimble, MD   Stopped at 03/07/15 1200  . carvedilol (COREG) tablet 6.25 mg  6.25 mg Oral BID WC Shanker M Ghimire, MD   6.25 mg at 03/10/15 0822  . feeding supplement (ENSURE ENLIVE) (ENSURE ENLIVE) liquid 237 mL  237 mL Oral BID BM Heather C Pitts, RD   237 mL at 03/10/15 0823  . hydrALAZINE (APRESOLINE) injection 10 mg  10 mg Intravenous Q6H PRN Shanker M Ghimire, MD      . hydrALAZINE (APRESOLINE) tablet 50 mg  50 mg Oral 3 times per day Shanker M Ghimire, MD   50 mg at 03/10/15 0543  . insulin aspart (novoLOG) injection 0-9 Units  0-9 Units Subcutaneous TID WC Shanker M Ghimire, MD   1 Units at 03/10/15 0703  . ondansetron (ZOFRAN) injection 4 mg  4 mg Intravenous Q6H PRN Peter J Sumner, DO   4 mg at 03/10/15 0252  . pantoprazole (PROTONIX) injection 40 mg  40 mg Intravenous Q12H Shanker M Ghimire, MD   40 mg at 03/10/15 0822  . simvastatin (ZOCOR) tablet 20 mg  20 mg Oral Daily Aaron Trimble, MD   20 mg at 03/10/15 0822    Allergies as of 03/03/2015  . (No Known Allergies)    Family History  Problem Relation Age of Onset  . Emphysema Father   . Cancer Brother     lung  . Cancer Sister     breast  . Cancer Maternal Aunt     ovarian  . Diabetes type II Mother   . Diabetes type II Sister     x3    Social History   Social History  . Marital Status: Widowed    Spouse Name: N/A  . Number of Children: N/A  . Years of Education: N/A   Occupational History  . Not on file.   Social History Main Topics  . Smoking status: Never Smoker   . Smokeless tobacco: Never Used  . Alcohol Use: No  . Drug Use: No  . Sexual Activity: Not on file   Other Topics Concern  . Not on file   Social History Narrative    Review of Systems: Ten point ROS is O/W negative except as mentioned in HPI.  Physical  Exam: Vital signs in last 24 hours: Temp:  [97.7 F (36.5 C)-98.5 F (36.9 C)] 98.5 F (36.9 C) (01/28 0700) Pulse Rate:  [70-81] 80 (01/28 0700) Resp:  [16-20] 20 (01/28 0700) BP: (124-173)/(65-81) 124/66 mmHg (01/28 0700) SpO2:  [95 %-98 %] 96 % (01/28   0700) Last BM Date: 03/09/15 General:  Alert, Well-developed, well-nourished, pleasant and cooperative in NAD Head:  Normocephalic and atraumatic. Eyes:  Sclera clear, no icterus.  Conjunctiva pink. Ears:  Normal auditory acuity. Mouth:  No deformity or lesions.   Lungs:  Clear throughout to auscultation.   No wheezes, crackles, or rhonchi.  Heart:  Regular rate and rhythm; no murmurs, clicks, rubs,  r gallops. Abdomen:  Soft, non-distended.  BS present.  Non-tender.  Rectal:  Deferred  Msk:  Symmetrical without gross deformities. Pulses:  Normal pulses noted. Extremities:  Without clubbing or edema. Neurologic:  Alert and oriented x 4;  grossly normal neurologically. Skin:  Intact without significant lesions or rashes. Psych:  Alert and cooperative. Normal mood and affect.  Lab Results:  Recent Labs  03/08/15 0657 03/10/15 0401 03/10/15 0740  WBC 8.4  --  10.1  HGB 11.4* 9.9* 9.9*  HCT 34.2* 30.9* 29.8*  PLT 314  --  422*   BMET  Recent Labs  03/08/15 0657 03/09/15 0454  NA 138 140  K 3.5 4.0  CL 103 102  CO2 27 25  GLUCOSE 122* 154*  BUN 22* 44*  CREATININE 0.86 1.06*  CALCIUM 9.0 9.2   Studies/Results: Dg Chest Port 1 View  03/09/2015  CLINICAL DATA:  Shortness of breath. EXAM: PORTABLE CHEST 1 VIEW COMPARISON:  03/07/2015. FINDINGS: Mediastinum hilar structures normal. Cardiomegaly with normal pulmonary vascularity. Interval near complete clearing of bilateral pulmonary infiltrates/edema. Small right pleural effusion cannot be excluded. Left costophrenic angle not imaged. No pneumothorax. IMPRESSION: Stable cardiomegaly with interim near complete resolution of bilateral pulmonary edema and/or infiltrates .  Electronically Signed   By: Thomas  Register   On: 03/09/2015 07:33    IMPRESSION:  -83 year old female who was admitted with a large acute hemorrhagic infarct who had hematemesis in the form of large clots overnight.  Hgb is stable this AM.  PLAN: -EGD later today.  Consent obtained from sister via phone. -Monitor Hgb. -Continue BID IV PPI for now.  ZEHR, JESSICA D.  03/10/2015, 10:15 AM  Pager number [phone]     Attending physician's note   I have taken a history, examined the patient and reviewed the chart. I agree with the Advanced Practitioner's note, impression and recommendations. Hematemesis with several large blood clots overnight in a patient with recent large hemorrhagic cerebellar infarct. Anticoagulants stopped 1 week ago. R/O MW tear, ulcer, etc. IV PPI bid for now, trend CBC. EGD today. Drs. Mann & Hung to assume GI care on Monday.   Asmar Brozek, MD FACG [phone] Mon-Fri 8a-5p [phone] after 5p, weekends, holidays   

## 2015-03-10 NOTE — Consult Note (Addendum)
Gastroenterology Consultation                                                           covering for Drs. Nicholes Mango    Referring Provider: No ref. provider found Primary Care Physician:  Hahn, Nancy Mesi, MD Primary Gastroenterologist:  Jacqulyn Ducking covering for Mann/Hung  Reason for Consultation:  Hematemesis  HPI: Nancy Hahn is a 82 y.o. female with a history of a breast mass and hypertension who was admitted to [facility] on 02/26/2014 for acute left subclavian artery thrombosis. She underwent thrombectomy on 02/28/2015. Patient was on IV heparin initially and was changed to Eliquis. She became febrile overnight with coughing with chest x-ray findings consistent with aspiration pneumonia. She also developed a change in mental status and an MRI which is a large acute hemorrhagic left cerebellar infarction mass effect on fourth ventricle. A shunt had not developed hydrocephalus, however. She was intubated on mechanical ventilation and subsequently transferred to [facility] for further management.  Was to be discharged to rehab yesterday but was supposed to have a TEE, which was not done (not put on the schedule or something) so discharge was held.  Then, overnight pt had a moderate to large hematemesis with large blood clots.  Prior to that she had a small amount of brown liquid vomit.  No further episodes this AM.  No black stools or bloody stools.  Patient denies nausea or abdominal pain.  Just started on pantoprazole 40 mg IV BID this AM.   Past Medical History  Diagnosis Date  . Breast mass   . Hypertension     Past Surgical History  Procedure Laterality Date  . Breast surgery  1980    biopsy  . Dilation and curettage of uterus    . Foot surgery    . Peripheral vascular catheterization Left 02/28/2015    Procedure: Upper Extremity Angiography;  Surgeon: Renford Dills, MD;  Location: [facility]  INVASIVE CV LAB;  Service: Cardiovascular;  Laterality: Left;  . Peripheral vascular catheterization  02/28/2015    Procedure: Upper Extremity Intervention;  Surgeon: Renford Dills, MD;  Location: [facility] INVASIVE CV LAB;  Service: Cardiovascular;;  . Embolectomy Left 02/28/2015    Procedure: EMBOLECTOMY ;  Surgeon: Renford Dills, MD;  Location: [facility] ORS;  Service: Vascular;  Laterality: Left;    Prior to Admission medications   Medication Sig Start Date End Date Taking? Authorizing Provider  aspirin 81 MG tablet Take 81 mg by mouth daily.   Yes Historical Provider, MD  atenolol (TENORMIN) 50 MG tablet Take 50 mg by mouth daily. 04/07/12  Yes Historical Provider, MD  NIFEdipine (PROCARDIA XL/ADALAT-CC) 90 MG 24 hr tablet Take 90 mg by mouth daily. 04/06/12  Yes Historical Provider, MD  simvastatin (ZOCOR) 20 MG tablet Take 20 mg by mouth daily. 04/07/12  Yes Historical Provider, MD  carvedilol (COREG) 12.5 MG tablet Take 1 tablet (12.5 mg total) by mouth 2 (two) times daily with a meal. 03/09/15   Maretta Bees, MD  feeding supplement, ENSURE ENLIVE, (ENSURE ENLIVE) LIQD Take 237 mLs by mouth 2 (two) times daily between meals. 03/09/15   Shanker Levora Dredge, MD  hydrALAZINE (APRESOLINE) 50 MG tablet Take 1 tablet (50 mg total) by mouth every 8 (eight)  hours. 03/09/15   Maretta Bees, MD  insulin aspart (NOVOLOG) 100 UNIT/ML injection 0-9 Units, Subcutaneous, 3 times daily with meals CBG < 70: implement hypoglycemia protocol CBG 70 - 120: 0 units CBG 121 - 150: 1 unit CBG 151 - 200: 2 units CBG 201 - 250: 3 units CBG 251 - 300: 5 units CBG 301 - 350: 7 units CBG 351 - 400: 9 units CBG > 400: call MD 03/09/15   Maretta Bees, MD  lisinopril (PRINIVIL,ZESTRIL) 10 MG tablet Take 1 tablet (10 mg total) by mouth daily. 03/09/15   Shanker Levora Dredge, MD  metFORMIN (GLUCOPHAGE) 500 MG tablet Take 1 tablet (500 mg total) by mouth 2 (two) times daily with a meal. 03/09/15   Maretta Bees, MD     Current Facility-Administered Medications  Medication Dose Route Frequency Provider Last Rate Last Dose  . 0.9 %  sodium chloride infusion  250 mL Intravenous PRN Jamie Kato, MD   Stopped at 03/07/15 1200  . carvedilol (COREG) tablet 6.25 mg  6.25 mg Oral BID WC Maretta Bees, MD   6.25 mg at 03/10/15 4098  . feeding supplement (ENSURE ENLIVE) (ENSURE ENLIVE) liquid 237 mL  237 mL Oral BID BM Arlyss Gandy, RD   237 mL at 03/10/15 0823  . hydrALAZINE (APRESOLINE) injection 10 mg  10 mg Intravenous Q6H PRN Maretta Bees, MD      . hydrALAZINE (APRESOLINE) tablet 50 mg  50 mg Oral 3 times per day Maretta Bees, MD   50 mg at 03/10/15 0543  . insulin aspart (novoLOG) injection 0-9 Units  0-9 Units Subcutaneous TID WC Maretta Bees, MD   1 Units at 03/10/15 0703  . ondansetron (ZOFRAN) injection 4 mg  4 mg Intravenous Q6H PRN Ramond Marrow, DO   4 mg at 03/10/15 0252  . pantoprazole (PROTONIX) injection 40 mg  40 mg Intravenous Q12H Maretta Bees, MD   40 mg at 03/10/15 1191  . simvastatin (ZOCOR) tablet 20 mg  20 mg Oral Daily Jamie Kato, MD   20 mg at 03/10/15 4782    Allergies as of 03/03/2015  . (No Known Allergies)    Family History  Problem Relation Age of Onset  . Emphysema Father   . Cancer Brother     lung  . Cancer Sister     breast  . Cancer Maternal Aunt     ovarian  . Diabetes type II Mother   . Diabetes type II Sister     x3    Social History   Social History  . Marital Status: Widowed    Spouse Name: N/A  . Number of Children: N/A  . Years of Education: N/A   Occupational History  . Not on file.   Social History Main Topics  . Smoking status: Never Smoker   . Smokeless tobacco: Never Used  . Alcohol Use: No  . Drug Use: No  . Sexual Activity: Not on file   Other Topics Concern  . Not on file   Social History Narrative    Review of Systems: Ten point ROS is O/W negative except as mentioned in HPI.  Physical  Exam: Vital signs in last 24 hours: Temp:  [97.7 F (36.5 C)-98.5 F (36.9 C)] 98.5 F (36.9 C) (01/28 0700) Pulse Rate:  [70-81] 80 (01/28 0700) Resp:  [16-20] 20 (01/28 0700) BP: (124-173)/(65-81) 124/66 mmHg (01/28 0700) SpO2:  [95 %-98 %] 96 % (01/28  0700) Last BM Date: 03/09/15 General:  Alert, Well-developed, well-nourished, pleasant and cooperative in NAD Head:  Normocephalic and atraumatic. Eyes:  Sclera clear, no icterus.  Conjunctiva pink. Ears:  Normal auditory acuity. Mouth:  No deformity or lesions.   Lungs:  Clear throughout to auscultation.   No wheezes, crackles, or rhonchi.  Heart:  Regular rate and rhythm; no murmurs, clicks, rubs,  r gallops. Abdomen:  Soft, non-distended.  BS present.  Non-tender.  Rectal:  Deferred  Msk:  Symmetrical without gross deformities. Pulses:  Normal pulses noted. Extremities:  Without clubbing or edema. Neurologic:  Alert and oriented x 4;  grossly normal neurologically. Skin:  Intact without significant lesions or rashes. Psych:  Alert and cooperative. Normal mood and affect.  Lab Results:  Recent Labs  03/08/15 0657 03/10/15 0401 03/10/15 0740  WBC 8.4  --  10.1  HGB 11.4* 9.9* 9.9*  HCT 34.2* 30.9* 29.8*  PLT 314  --  422*   BMET  Recent Labs  03/08/15 0657 03/09/15 0454  NA 138 140  K 3.5 4.0  CL 103 102  CO2 27 25  GLUCOSE 122* 154*  BUN 22* 44*  CREATININE 0.86 1.06*  CALCIUM 9.0 9.2   Studies/Results: Dg Chest Port 1 View  03/09/2015  CLINICAL DATA:  Shortness of breath. EXAM: PORTABLE CHEST 1 VIEW COMPARISON:  03/07/2015. FINDINGS: Mediastinum hilar structures normal. Cardiomegaly with normal pulmonary vascularity. Interval near complete clearing of bilateral pulmonary infiltrates/edema. Small right pleural effusion cannot be excluded. Left costophrenic angle not imaged. No pneumothorax. IMPRESSION: Stable cardiomegaly with interim near complete resolution of bilateral pulmonary edema and/or infiltrates .  Electronically Signed   By: Maisie Fus  Register   On: 03/09/2015 07:33    IMPRESSION:  -82 year old female who was admitted with a large acute hemorrhagic infarct who had hematemesis in the form of large clots overnight.  Hgb is stable this AM.  PLAN: -EGD later today.  Consent obtained from sister via phone. -Monitor Hgb. -Continue BID IV PPI for now.  ZEHR, JESSICA D.  03/10/2015, 10:15 AM  Pager number [phone]     Attending physician's note   I have taken a history, examined the patient and reviewed the chart. I agree with the Advanced Practitioner's note, impression and recommendations. Hematemesis with several large blood clots overnight in a patient with recent large hemorrhagic cerebellar infarct. Anticoagulants stopped 1 week ago. R/O MW tear, ulcer, etc. IV PPI bid for now, trend CBC. EGD today. Drs. Mann & Elnoria Howard to assume GI care on Monday.   Claudette Head, MD Clementeen Graham [phone] Mon-Fri 8a-5p [phone] after 5p, weekends, holidays

## 2015-03-10 NOTE — Progress Notes (Signed)
SLP Cancellation Note  Patient DetailsMAHOGONY GILCHRESTis [name] MRN: [id number] DOB: [birth year]   Cancelled treatment:       Reason Eval/Treat Not Completed: Other (comment) (NPO awaiting EGD procedure this date); SLP to follow up   Marcene Duos MA, CCC-SLP Acute Care Speech Language Pathologist    Kennieth Rad 03/10/2015, 11:34 AM

## 2015-03-10 NOTE — Progress Notes (Signed)
STROKE TEAM PROGRESS NOTE   SUBJECTIVE (INTERVAL HISTORY) No family members present. The patient feels that she is improving. She had some nausea and vomiting earlier. There was a report of blood in the vomitus. A GI consult was obtained and the patient was scheduled for an upper endoscopy today with Dr. Russella Dar. Please see results as noted below. A TEE and possible loop to be scheduled for Monday. A CT scan has been ordered to follow-up on hydrocephalus.   OBJECTIVE Temp:  [97.5 F (36.4 C)-98.5 F (36.9 C)] 98 F (36.7 C) (01/28 1432) Pulse Rate:  [60-81] 78 (01/28 1500) Cardiac Rhythm:  [-] Normal sinus rhythm (01/27 2101) Resp:  [14-32] 17 (01/28 1500) BP: (112-162)/(42-80) 160/42 mmHg (01/28 1440) SpO2:  [95 %-100 %] 100 % (01/28 1500)  CBC:   Recent Labs Lab 03/07/15 0446 03/08/15 0657 03/10/15 0401 03/10/15 0740  WBC 8.7 8.4  --  10.1  NEUTROABS 7.2 6.3  --   --   HGB 10.9* 11.4* 9.9* 9.9*  HCT 32.6* 34.2* 30.9* 29.8*  MCV 89.8 89.5  --  89.8  PLT 299 314  --  422*    Basic Metabolic Panel:   Recent Labs Lab 03/07/15 0446 03/08/15 0657 03/09/15 0454  NA 140 138 140  K 3.5 3.5 4.0  CL 97* 103 102  CO2 GLUCOSE 149* 122* 154*  BUN 13 22* 44*  CREATININE 0.84 0.86 1.06*  CALCIUM 8.5* 9.0 9.2  MG 1.9 2.1  --   PHOS 4.0 3.7  --     Lipid Panel:     Component Value Date/Time   CHOL 144 03/10/2015 0900   TRIG 128 03/10/2015 0900   HDL 30* 03/10/2015 0900   CHOLHDL 4.8 03/10/2015 0900   VLDL 26 03/10/2015 0900   LDLCALC 88 03/10/2015 0900   HgbA1c:  Lab Results  Component Value Date   HGBA1C 6.6* 02/27/2015   Urine Drug Screen: No results found for: LABOPIA, COCAINSCRNUR, LABBENZ, AMPHETMU, THCU, LABBARB    IMAGING I have personally reviewed the radiological images below and agree with the radiology interpretations.  Mr Nancy Hahn Wo Contrast 03/03/2015   Large acute hemorrhagic left mid to inferior cerebellar infarct with associated mass  effect compressing the fourth ventricle. Interval slight increase in size of ventricles compared to recent CT consistent with developing hydrocephalus. Left vertebral artery is at least partially patent. Limited assessment of the left posterior inferior cerebellar artery.   CTA head and neck CT HEAD Large left cerebellar infarct with hemorrhage better appreciated on recently performed MR. Mass-effect upon the adjacent structures including narrowing of the fourth ventricle and inferior aspect of the aqueduct has progressed slightly since the prior exam (series 201, image 10). Dilated lateral ventricles/ mild hydrocephalus similar to prior exam (best appreciated when compared to remote CT).  CTA NECK Plaque carotid bifurcation / proximal internal carotid artery bilaterally with maximal 50% diameter stenosis proximal internal carotid artery bilaterally. Right vertebral artery is slightly larger than left. No significant stenosis of the cervical segment of the vertebral arteries on either side. Small to slightly moderate-size bilateral pleural effusions with adjacent passive atelectasis. Endotracheal tube and nasogastric tube in place.  CTA HEAD Mild narrowing irregularity M1 segment left middle cerebral artery. Mild narrowing right middle cerebral artery bifurcation. Calcified plaque with mild narrowing cavernous segment internal carotid artery bilaterally. Narrowing of the left vertebral artery from the foramen magnum to formation of the basilar artery. Poor delineation of the left posterior  inferior cerebellar artery and appears occluded. No significant stenosis of the distal right vertebral artery. Mild irregularity of the basilar artery without significant stenosis. Irregular anterior inferior cerebellar arteries bilaterally. Fetal type contribution to formation of the left posterior cerebral artery. No high-grade stenosis of the posterior cerebral artery however, distal branch  vessel narrowing and irregularity greater on the right.  Head CT without contrast 03/10/2015 Expected evolutionary changes of the inferior left cerebellar infarction.  Less swelling and mass effect than on the previous studies.  No evidence of complication such as hemorrhage.  No new infarction.  Upper endoscopy 03/10/2015  IMPRESSION: 1. Single ulcer in the gastric body 2. Erosive gastritis in the gastric body 3. The EGD otherwise appeared normal  TTE  - Left ventricle: The cavity size was normal. Systolic function was normal. The estimated ejection fraction was in the range of 55% to 60%. Wall motion was normal; there were no regional wall motion abnormalities. Features are consistent with a pseudonormal left ventricular filling pattern, with concomitant abnormal relaxation and increased filling pressure (grade 2 diastolic dysfunction). - Mitral valve: Mildly calcified annulus. There was mild regurgitation. - Left atrium: The atrium was mildly dilated.   PHYSICAL EXAM Frail elderly lady   . Afebrile. Head is nontraumatic. Neck is supple without bruit. Cardiac exam no murmur or gallop. Lungs are clear to auscultation. Distal pulses are well felt. Neurological Exam :  Awake and alert, mild lethargy, orientated to time, place and people. PERRL. fundi not visualized. Vision acuity and fields seem adequate. Extraocular movements are full range without nystagmus. Mild saccadic dysmetria on left gaze Face symmetric. Opens eyes to light stimulation.   Can follow simple commands, nods to questions. No gaze deviation. Does track. Conjugate gaze.No nystagmus. moves all extremities equally. Tongue midline. Cough and gag intact. No extremity drift or focal weakness. Mild left finger-to-nose dysmetria. Sensation appears intact bilaterally. Toes equivocal.  Gait deferred Gait: Unable to test   ASSESSMENT/PLAN Nancy Hahn is a 82 y.o. 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 She did not receive IV t-PA due to hemorrhage.   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 Unlikely due to L VA embolism from subclavian thrombosis as it is distal to [place] take off  Resultant  Mild left UE dysmetria  MRI  - Large left cerebellar infarct with small hemorrhagic transformation.  Repeat Head CT - 03/10/2015 - less swelling. Expected evolution of stroke.   CTA head and neck - left PICA occlusion  2D Echo - EF 55-60%. No cardiac source of emboli identified.  LDL - 88  HgbA1c - pending  VTE prophylaxis - SCDs  Diet - low sodium heart healthy Diet Carb Modified Diet NPO time specified Diet full liquid Room service appropriate?: Yes; Fluid consistency:: Thin  aspirin 81 mg daily prior to admission, now on No antithrombotic secondary to hemorrhage.  Ongoing aggressive stroke risk factor management  Therapy recommendations: Skilled nursing facility placement recommended - not approved for CIR.  Disposition:  Pending  Left subclavian artery thrombosis, suspicious for afib  S/p thrombectomy  Was on eliquis, stopped due to hemorrhagic transformation  May consider to resume once hemorrhage resolved on CT  GIB  Bloody vomitus   EGD done showed single gastric ulcer and gastritis  On protonix injection  CBC monitoring  Hypertension  Blood pressure tends  to run low  Permissive hypertension   Hyperlipidemia  Zocor 20 mg daily prior to admission - resumed  LDL 88 - Goal < 70  Increase Zocor to 40 mg daily  Continue statin at time of discharge  Other Stroke Risk Factors  Advanced age  Obesity, Body mass index is 28.88 kg/(m^2).    Other Active Problems  Anemia - appears stable  Renal insufficiency - check again on  Monday - recently started on lisinopril   Marvel Plan, MD PhD Stroke Neurology 03/11/2015 1:42 AM   To contact Stroke Continuity provider, please refer to [email or web address]. After hours, contact General Neurology

## 2015-03-10 NOTE — Interval H&P Note (Signed)
History and Physical Interval Note:  03/10/2015 2:40 PM  Nancy Hahn  has presented today for surgery, with the diagnosis of Hematemesis  The various methods of treatment have been discussed with the patient and family. After consideration of risks, benefits and other options for treatment, the patient has consented to  Procedure(s): ESOPHAGOGASTRODUODENOSCOPY (EGD) (N/A) as a surgical intervention .  The patient's history has been reviewed, patient examined, no change in status, stable for surgery.  I have reviewed the patient's chart and labs.  Questions were answered to the patient's satisfaction.     Venita Lick. Russella Dar

## 2015-03-11 DIAGNOSIS — E785 Hyperlipidemia, unspecified: Secondary | ICD-10-CM | POA: Insufficient documentation

## 2015-03-11 DIAGNOSIS — K922 Gastrointestinal hemorrhage, unspecified: Secondary | ICD-10-CM | POA: Insufficient documentation

## 2015-03-11 DIAGNOSIS — I10 Essential (primary) hypertension: Secondary | ICD-10-CM | POA: Insufficient documentation

## 2015-03-11 DIAGNOSIS — I748 Embolism and thrombosis of other arteries: Secondary | ICD-10-CM | POA: Insufficient documentation

## 2015-03-11 DIAGNOSIS — R112 Nausea with vomiting, unspecified: Secondary | ICD-10-CM

## 2015-03-11 DIAGNOSIS — I639 Cerebral infarction, unspecified: Secondary | ICD-10-CM | POA: Insufficient documentation

## 2015-03-11 DIAGNOSIS — K250 Acute gastric ulcer with hemorrhage: Secondary | ICD-10-CM

## 2015-03-11 LAB — GLUCOSE, CAPILLARY
GLUCOSE-CAPILLARY: 123 mg/dL — AB (ref 65–99)
GLUCOSE-CAPILLARY: 123 mg/dL — AB (ref 65–99)
Glucose-Capillary: 126 mg/dL — ABNORMAL HIGH (ref 65–99)
Glucose-Capillary: 132 mg/dL — ABNORMAL HIGH (ref 65–99)
Glucose-Capillary: 112 mg/dL — ABNORMAL HIGH (ref 65–99)

## 2015-03-11 LAB — BASIC METABOLIC PANEL
Anion gap: 9 (ref 5–15)
BUN: 38 mg/dL — ABNORMAL HIGH (ref 6–20)
CALCIUM: 9 mg/dL (ref 8.9–10.3)
CO2: 26 mmol/L (ref 22–32)
CREATININE: 0.97 mg/dL (ref 0.44–1.00)
Chloride: 104 mmol/L (ref 101–111)
GFR calc non Af Amer: 53 mL/min — ABNORMAL LOW (ref >60)
Glucose, Bld: 149 mg/dL — ABNORMAL HIGH (ref 65–99)
Potassium: 4 mmol/L (ref 3.5–5.1)
Sodium: 139 mmol/L (ref 135–145)

## 2015-03-11 LAB — CBC
HCT: 27.5 % — ABNORMAL LOW (ref 36.0–46.0)
Hemoglobin: 8.9 g/dL — ABNORMAL LOW (ref 12.0–15.0)
MCH: 29.5 pg (ref 26.0–34.0)
MCHC: 32.4 g/dL (ref 30.0–36.0)
MCV: 91.1 fL (ref 78.0–100.0)
PLATELETS: 396 10*3/uL (ref 150–400)
RBC: 3.02 MIL/uL — AB (ref 3.87–5.11)
RDW: 13.7 % (ref 11.5–15.5)
WBC: 9.3 10*3/uL (ref 4.0–10.5)

## 2015-03-11 MED ORDER — ONDANSETRON HCL 4 MG/2ML IJ SOLN
4.0000 mg | Freq: Four times a day (QID) | INTRAMUSCULAR | Status: DC
  Administered 2015-03-11 – 2015-03-12 (×6): 4 mg via INTRAVENOUS
  Filled 2015-03-11 (×7): qty 2

## 2015-03-11 NOTE — Progress Notes (Addendum)
PATIENT DETAILS Name: Nancy Hahn Age: 82 y.o. Sex: female Date of Birth: 06/13/1931 Admit Date: 03/03/2015 Admitting Physician Kalman Shan, MD WUJ:[id number], Lavada Mesi, MD  Brief narrative 82 y/o woman who was admitted to [facility] on 1/18 w/ cold and painful LUE. 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 She was treated with heparin and then transitioned to Elliquis. On the morning of 1/21 she was unable to maintain her balance, slurred speech, and L facial droop. An MRI was was obtained which showed a large cerebral stroke w/ evidence of hemorrhage and concern for developing hydrocephalous. She was subsequently intubated, and transferred to [facility]. Once stabilized/extubated, transferred to [facility] hospitalist service on 1/26.   Subjective: Awake, alert. 1 episode of vomiting this am  Assessment/Plan: Active Problems:  Acute ischemic stroke with hemorrhagic conversion: Improving, no focal deficits seen on exam. MRI brain showed a Large acute hemorrhagic left mid to inferior cerebellar infarct with associated mass effect. CTA neck showed approximately 50% stenosis bilaterally. TTE 1/15 EF 55%, no obvious embolic source.  Currently not on any antiplatelets given hemorrhagic conversion and now GI bleed. LDL 56, A1c 6.6. Initially plans were to discharge to CIR, however upon further evaluation felt more appropriate for SNF. Neurology recommends a TEE, and likely will get a 30 day event monitor on discharge per cardiology. Keep NPO overnight-have reached out to cardiology to see if TEE can be done tomorrow.Per GI-spoke with Dr Carmin Muskrat avoid ASA/Anticoagulation for atleast 1 week.  Upper GI bleed: Resolved-EGD showed gastric ulcer-No further hematemesis. Hb stable  Acute Blood loss anemia: mild ABLA-on top of anemia of chronic disease.Hb stable  Acute respiratory failure with hypoxia: Secondary to CVA, healthcare  associated pneumonia. Required intubation, subsequently extubated on 1/23- now doing well on room air.  Healthcare associated pneumonia: Afebrile, improved. Has completed a course of antibiotics. Continue to monitor off antibiotics.  Acute diastolic heart failure: Compensated.  Hypertension:  better controlled-was Uncontrolled over the past few days , spoke with Dr. Pearlean Brownie, okay to start lowering blood pressure more. Continue hydralazine and Coreg. Follow and adjust accordingly  Ischemic left upper extremity: Secondary to acute left subclavian artery thrombosis-underwent status post embolectomy on 02/27/14. Was on anticoagulation with IV heparin, which was then changed to Eliquis. Unfortunately had a large ischemic stroke with hemorrhagic conversion, subsequently off all anticoagulation at present. Plans are to proceed with TEE either inpatient or outpatient , and a event recorder as outpatient. Etiology likely paroxysmal atrial fibrillation.  Dyslipidemia: LDL 56, continue statin  Type 2 diabetes: Continue with SSI, suspect may require initiation of metformin on discharge.  Disposition: Remain inpatient-SNF in 1-2 days  Antimicrobial agents  See below  Anti-infectives    Start     Dose/Rate Route Frequency Ordered Stop   03/05/15 1000  levofloxacin (LEVAQUIN) IVPB 500 mg  Status:  Discontinued     500 mg 100 mL/hr over 60 Minutes Intravenous Every 24 hours 03/04/15 0203 03/04/15 0820   03/04/15 2200  vancomycin (VANCOCIN) IVPB 1000 mg/200 mL premix  Status:  Discontinued     1,000 mg 200 mL/hr over 60 Minutes Intravenous Every 12 hours 03/04/15 0846 03/06/15 1140   03/04/15 1000  piperacillin-tazobactam (ZOSYN) IVPB 3.375 g  Status:  Discontinued     3.375 g 12.5 mL/hr over 240 Minutes Intravenous Every 8 hours 03/04/15 0846 03/06/15 1140  03/04/15 1000  vancomycin (VANCOCIN) 1,500 mg in sodium chloride 0.9 % 500 mL IVPB     1,500 mg 250 mL/hr over 120 Minutes Intravenous  Once  03/04/15 0846 03/04/15 1200      DVT Prophylaxis: SCD's  Code Status: Full code   Family Communication None at bedside  Procedures: None  CONSULTS:  cardiology, pulmonary/intensive care and neurology  Time spent 30 minutes-Greater than 50% of this time was spent in counseling, explanation of diagnosis, planning of further management, and coordination of care.  MEDICATIONS: Scheduled Meds: . carvedilol  6.25 mg Oral BID WC  . feeding supplement (ENSURE ENLIVE)  237 mL Oral BID BM  . hydrALAZINE  50 mg Oral 3 times per day  . insulin aspart  0-9 Units Subcutaneous TID WC  . ondansetron (ZOFRAN) IV  4 mg Intravenous 4 times per day  . pantoprazole (PROTONIX) IV  40 mg Intravenous Q12H  . simvastatin  40 mg Oral q1800   Continuous Infusions:  PRN Meds:.sodium chloride, hydrALAZINE    PHYSICAL EXAM: Vital signs in last 24 hours: Filed Vitals:   03/11/15 0144 03/11/15 0500 03/11/15 0521 03/11/15 0904  BP: 134/59  153/55 127/58  Pulse: 66  68 72  Temp: 97.5 F (36.4 C)  97.3 F (36.3 C) 97.9 F (36.6 C)  TempSrc: Oral  Oral Oral  Resp: 16  18 18   Height:      Weight:  84.46 kg (186 lb 3.2 oz)    SpO2: 97%  95% 100%    Weight change: -1.678 kg (-3 lb 11.2 oz) Filed Weights   03/08/15 0427 03/09/15 0934 03/11/15 0500  Weight: 84.142 kg (185 lb 8 oz) 86.138 kg (189 lb 14.4 oz) 84.46 kg (186 lb 3.2 oz)   Body mass index is 28.32 kg/(m^2).   Gen Exam: Awake and alert with clear speech.   Neck: Supple, No JVD.   Chest: B/L Clear.   CVS: S1 S2 Regular, no murmurs.  Abdomen: soft, BS +, non tender, non distended.  Extremities: no edema, lower extremities warm to touch Neurologic: Non Focal.   Skin: No Rash.   Wounds: N/A.    Intake/Output from previous day:  Intake/Output Summary (Last 24 hours) at 03/11/15 1105 Last data filed at 03/10/15 1323  Gross per 24 hour  Intake      0 ml  Output      0 ml  Net      0 ml     LAB RESULTS: CBC  Recent  Labs Lab 03/05/15 0055 03/06/15 0507 03/07/15 0446 03/08/15 0657 03/10/15 0401 03/10/15 0740 03/11/15 0504  WBC 7.2 6.1 8.7 8.4  --  10.1 9.3  HGB 9.9* 10.0* 10.9* 11.4* 9.9* 9.9* 8.9*  HCT 28.3* 29.9* 32.6* 34.2* 30.9* 29.8* 27.5*  PLT 138* 212 299 314  --  422* 396  MCV 89.3 88.7 89.8 89.5  --  89.8 91.1  MCH 31.2 29.7 30.0 29.8  --  29.8 29.5  MCHC 35.0 33.4 33.4 33.3  --  33.2 32.4  RDW 13.3 13.4 13.4 13.2  --  13.4 13.7  LYMPHSABS 1.0 0.8 0.6* 1.2  --   --   --   MONOABS 0.3 0.3 0.6 0.6  --   --   --   EOSABS 0.3 0.2 0.3 0.3  --   --   --   BASOSABS 0.0 0.0 0.0 0.0  --   --   --     Chemistries   Recent Labs  Lab 03/05/15 0055 03/05/15 2020 03/06/15 0507 03/07/15 0446 03/08/15 0657 03/09/15 0454  NA 141 142 141 140 138 140  K 5.4* 3.9 3.0* 3.5 3.5 4.0  CL 108 106 106 97* 103 102  CO2 21* 25 22 26 27 25   GLUCOSE 119* 160* 180* 149* 122* 154*  BUN 24* 22* 20 13 22* 44*  CREATININE 0.84 0.83 0.67 0.84 0.86 1.06*  CALCIUM 8.0* 8.2* 8.3* 8.5* 9.0 9.2  MG 2.5*  --  1.9 1.9 2.1  --     CBG:  Recent Labs Lab 03/10/15 1143 03/10/15 1655 03/10/15 2319 03/11/15 0638 03/11/15 0655  GLUCAP 228* 135* 116* 123* 126*    GFR Estimated Creatinine Clearance: 47.4 mL/min (by C-G formula based on Cr of 1.06).  Coagulation profile No results for input(s): INR, PROTIME in the last 168 hours.  Cardiac Enzymes No results for input(s): CKMB, TROPONINI, MYOGLOBIN in the last 168 hours.  Invalid input(s): CK  Invalid input(s): POCBNP No results for input(s): DDIMER in the last 72 hours. No results for input(s): HGBA1C in the last 72 hours.  Recent Labs  03/10/15 0900  CHOL 144  HDL 30*  LDLCALC 88  TRIG 161  CHOLHDL 4.8    Recent Labs  03/10/15 0900  TSH 2.029    Recent Labs  03/10/15 0900  VITAMINB12 472   No results for input(s): LIPASE, AMYLASE in the last 72 hours.  Urine Studies No results for input(s): UHGB, CRYS in the last 72  hours.  Invalid input(s): UACOL, UAPR, USPG, UPH, UTP, UGL, UKET, UBIL, UNIT, UROB, ULEU, UEPI, UWBC, URBC, UBAC, CAST, UCOM, BILUA  MICROBIOLOGY: Recent Results (from the past 240 hour(s))  MRSA PCR Screening     Status: None   Collection Time: 03/03/15 11:01 PM  Result Value Ref Range Status   MRSA by PCR NEGATIVE NEGATIVE Final    Comment:        The GeneXpert MRSA Assay (FDA approved for NASAL specimens only), is one component of a comprehensive MRSA colonization surveillance program. It is not intended to diagnose MRSA infection nor to guide or monitor treatment for MRSA infections.   Culture, respiratory (NON-Expectorated)     Status: None   Collection Time: 03/04/15  8:31 AM  Result Value Ref Range Status   Specimen Description TRACHEAL ASPIRATE  Final   Special Requests Normal  Final   Gram Stain   Final    ABUNDANT WBC PRESENT, PREDOMINANTLY PMN NO SQUAMOUS EPITHELIAL CELLS SEEN NO ORGANISMS SEEN Performed at [facility]    Culture   Final    FEW YEAST CONSISTENT WITH CANDIDA SPECIES Performed at [facility]    Report Status 03/06/2015 FINAL  Final    RADIOLOGY STUDIES/RESULTS: Ct Angio Head W/cm &/or Wo Cm  03/06/2015  CLINICAL DATA:  82 year old female with off balance, slurred speech, altered mental status and left facial droop. Left cerebellar infarct. Subsequent encounter. EXAM: CT ANGIOGRAPHY HEAD AND NECK TECHNIQUE: Multidetector CT imaging of the head and neck was performed using the standard protocol during bolus administration of intravenous contrast. Multiplanar CT image reconstructions and MIPs were obtained to evaluate the vascular anatomy. Carotid stenosis measurements (when applicable) are obtained utilizing NASCET criteria, using the distal internal carotid diameter as the denominator. CONTRAST:  80mL OMNIPAQUE IOHEXOL 350 MG/ML SOLN COMPARISON:  03/04/2015 and 02/26/2014 head CT. 03/03/2015 brain MR. FINDINGS: CT HEAD Brain: Large  left cerebellar infarct with hemorrhage better appreciated on recently performed MR. Mass-effect upon the  adjacent structures including narrowing of the fourth ventricle and inferior aspect of the aqueduct has progressed slightly since the prior exam (series 201, image 10). Dilated lateral ventricles/ mild hydrocephalus similar to prior exam (best appreciated when compared to remote CT). Calvarium and skull base: Negative. Paranasal sinuses: Minimal mucosal thickening maxillary sinuses greatest inferior right maxillary sinus with slight polypoid opacification. Orbits: Mild exophthalmos. CTA NECK Aortic arch: 3 vessel arch. Right carotid system: Plaque right carotid bifurcation proximal right internal carotid artery with maximal 50% diameter stenosis proximal right internal carotid artery. Left carotid system: Plaque left carotid bifurcation proximal left internal carotid artery with maximal 50% diameter stenosis proximal left internal carotid artery. Vertebral arteries:Right vertebral artery is slightly larger than left. No significant stenosis of the cervical segment of the vertebral arteries on either side. Plaque with mild narrowing right subclavian artery. Skeleton: Cervical spondylotic changes most notable C6-7. Other neck: No worrisome primary neck mass. Small to slightly moderate-size bilateral pleural effusions with adjacent passive atelectasis. Endotracheal tube and nasogastric tube in place. CTA HEAD Anterior circulation: Mild narrowing irregularity M1 segment left middle cerebral artery. Mild narrowing right middle cerebral artery bifurcation. Calcified plaque with mild narrowing cavernous segment internal carotid artery bilaterally. Posterior circulation: Narrowing of the left vertebral artery from the foramen magnum to formation of the basilar artery. Poor delineation of the left posterior inferior cerebellar artery. No significant stenosis of the distal right vertebral artery. Mild irregularity of the  basilar artery without significant stenosis. Irregular anterior inferior cerebellar arteries bilaterally. Fetal type contribution to formation of the left posterior cerebral artery. No high-grade stenosis of the posterior cerebral artery however, distal branch vessel narrowing and irregularity greater on the right. No aneurysm noted. Venous sinuses: Patent. Anatomic variants: As above. Delayed phase: As above. IMPRESSION: CT HEAD Large left cerebellar infarct with hemorrhage better appreciated on recently performed MR. Mass-effect upon the adjacent structures including narrowing of the fourth ventricle and inferior aspect of the aqueduct has progressed slightly since the prior exam (series 201, image 10). Dilated lateral ventricles/ mild hydrocephalus similar to prior exam (best appreciated when compared to remote CT). CTA NECK Plaque carotid bifurcation / proximal internal carotid artery bilaterally with maximal 50% diameter stenosis proximal internal carotid artery bilaterally. Right vertebral artery is slightly larger than left. No significant stenosis of the cervical segment of the vertebral arteries on either side. Small to slightly moderate-size bilateral pleural effusions with adjacent passive atelectasis. Endotracheal tube and nasogastric tube in place. CTA HEAD Mild narrowing irregularity M1 segment left middle cerebral artery. Mild narrowing right middle cerebral artery bifurcation. Calcified plaque with mild narrowing cavernous segment internal carotid artery bilaterally. Narrowing of the left vertebral artery from the foramen magnum to formation of the basilar artery. Poor delineation of the left posterior inferior cerebellar artery and appears occluded. No significant stenosis of the distal right vertebral artery. Mild irregularity of the basilar artery without significant stenosis. Irregular anterior inferior cerebellar arteries bilaterally. Fetal type contribution to formation of the left posterior  cerebral artery. No high-grade stenosis of the posterior cerebral artery however, distal branch vessel narrowing and irregularity greater on the right. Electronically Signed   By: Lacy Duverney M.D.   On: 03/06/2015 08:04   Dg Chest 1 View  03/03/2015  CLINICAL DATA:  Acute onset of hypoxia. Cough and shortness of breath. Insert macro EXAM: CHEST 1 VIEW COMPARISON:  Chest radiograph performed 02/27/2015 FINDINGS: The lungs are well-aerated. Patchy right basilar airspace opacity raises concern for pneumonia. The appearance  is less typical for asymmetric pulmonary edema. Underlying vascular congestion is noted. Mild left perihilar opacity is seen. No pleural effusion or pneumothorax is identified. The cardiomediastinal silhouette is mildly enlarged. No acute osseous abnormalities are seen. IMPRESSION: 1. Patchy right basilar airspace opacity raises concern for pneumonia. The appearance is less typical for asymmetric pulmonary edema. Mild left perihilar opacity seen. 2. Underlying vascular congestion and mild cardiomegaly. Electronically Signed   By: Roanna Raider M.D.   On: 03/03/2015 01:06   Dg Chest 2 View  02/27/2015  CLINICAL DATA:  Acute onset left arm pain at 4:30 p.m. today. Initial encounter. EXAM: CHEST  2 VIEW COMPARISON:  None. FINDINGS: Calcified granuloma on the right is noted. The lungs are otherwise clear. Heart size is normal. No pneumothorax or pleural effusion. Thoracic spondylosis is identified. IMPRESSION: No acute abnormality. Electronically Signed   By: Drusilla Kanner M.D.   On: 02/27/2015 20:18   Dg Abd 1 View  03/03/2015  CLINICAL DATA:  Status post enteric catheter placement. EXAM: ABDOMEN - 1 VIEW COMPARISON:  None. FINDINGS: Enteric catheter seen with tip overlying gastric body. The bowel gas pattern is normal. No radiographic evidence of organomegaly. No radio-opaque calculi or other significant radiographic abnormality are seen. IMPRESSION: Enteric catheter with tip overlying  the gastric body. Electronically Signed   By: Ted Mcalpine M.D.   On: 03/03/2015 19:05   Ct Head Wo Contrast  03/10/2015  CLINICAL DATA:  Recent stroke with left-sided weakness and slurred speech. New frontal headache. EXAM: CT HEAD WITHOUT CONTRAST TECHNIQUE: Contiguous axial images were obtained from the base of the skull through the vertex without intravenous contrast. COMPARISON:  03/06/2015 and previous FINDINGS: Go low-density persists in the inferior cerebellum on the left, but swelling is subsidence slightly. Less mass effect upon the fourth ventricle. No evidence of developing hemorrhage. No new insult. Cerebral hemispheres are normal. Ventricular size is stable. No extra-axial collection. Sinuses, middle ears and mastoids are clear. IMPRESSION: Expected evolutionary changes of the inferior left cerebellar infarction. Less swelling and mass effect than on the previous studies. No evidence of complication such as hemorrhage. No new infarction. Electronically Signed   By: Paulina Fusi M.D.   On: 03/10/2015 13:12   Ct Head Wo Contrast  03/04/2015  CLINICAL DATA:  Known left cerebellar infarct, evaluate for hydrocephalus EXAM: CT HEAD WITHOUT CONTRAST TECHNIQUE: Contiguous axial images were obtained from the base of the skull through the vertex without intravenous contrast. COMPARISON:  03/03/2015 FINDINGS: Bony calvarium is intact. There are again noted changes consistent with a left cerebellar infarct with mass-effect upon the fourth ventricle and pons. The lateral ventricles arm more prominent than that seen on a prior exam from 02/26/2014 but relatively stable from the recent MRI examination from the previous day. No acute hemorrhage is identified. No other focal abnormality is seen. IMPRESSION: Left cerebellar infarct with evidence of mild ventricular dilatation stable from the previous day. Electronically Signed   By: Alcide Clever M.D.   On: 03/04/2015 09:07   Ct Angio Neck W/cm &/or  Wo/cm  03/06/2015  CLINICAL DATA:  82 year old female with off balance, slurred speech, altered mental status and left facial droop. Left cerebellar infarct. Subsequent encounter. EXAM: CT ANGIOGRAPHY HEAD AND NECK TECHNIQUE: Multidetector CT imaging of the head and neck was performed using the standard protocol during bolus administration of intravenous contrast. Multiplanar CT image reconstructions and MIPs were obtained to evaluate the vascular anatomy. Carotid stenosis measurements (when applicable) are obtained utilizing NASCET  criteria, using the distal internal carotid diameter as the denominator. CONTRAST:  80mL OMNIPAQUE IOHEXOL 350 MG/ML SOLN COMPARISON:  03/04/2015 and 02/26/2014 head CT. 03/03/2015 brain MR. FINDINGS: CT HEAD Brain: Large left cerebellar infarct with hemorrhage better appreciated on recently performed MR. Mass-effect upon the adjacent structures including narrowing of the fourth ventricle and inferior aspect of the aqueduct has progressed slightly since the prior exam (series 201, image 10). Dilated lateral ventricles/ mild hydrocephalus similar to prior exam (best appreciated when compared to remote CT). Calvarium and skull base: Negative. Paranasal sinuses: Minimal mucosal thickening maxillary sinuses greatest inferior right maxillary sinus with slight polypoid opacification. Orbits: Mild exophthalmos. CTA NECK Aortic arch: 3 vessel arch. Right carotid system: Plaque right carotid bifurcation proximal right internal carotid artery with maximal 50% diameter stenosis proximal right internal carotid artery. Left carotid system: Plaque left carotid bifurcation proximal left internal carotid artery with maximal 50% diameter stenosis proximal left internal carotid artery. Vertebral arteries:Right vertebral artery is slightly larger than left. No significant stenosis of the cervical segment of the vertebral arteries on either side. Plaque with mild narrowing right subclavian artery.  Skeleton: Cervical spondylotic changes most notable C6-7. Other neck: No worrisome primary neck mass. Small to slightly moderate-size bilateral pleural effusions with adjacent passive atelectasis. Endotracheal tube and nasogastric tube in place. CTA HEAD Anterior circulation: Mild narrowing irregularity M1 segment left middle cerebral artery. Mild narrowing right middle cerebral artery bifurcation. Calcified plaque with mild narrowing cavernous segment internal carotid artery bilaterally. Posterior circulation: Narrowing of the left vertebral artery from the foramen magnum to formation of the basilar artery. Poor delineation of the left posterior inferior cerebellar artery. No significant stenosis of the distal right vertebral artery. Mild irregularity of the basilar artery without significant stenosis. Irregular anterior inferior cerebellar arteries bilaterally. Fetal type contribution to formation of the left posterior cerebral artery. No high-grade stenosis of the posterior cerebral artery however, distal branch vessel narrowing and irregularity greater on the right. No aneurysm noted. Venous sinuses: Patent. Anatomic variants: As above. Delayed phase: As above. IMPRESSION: CT HEAD Large left cerebellar infarct with hemorrhage better appreciated on recently performed MR. Mass-effect upon the adjacent structures including narrowing of the fourth ventricle and inferior aspect of the aqueduct has progressed slightly since the prior exam (series 201, image 10). Dilated lateral ventricles/ mild hydrocephalus similar to prior exam (best appreciated when compared to remote CT). CTA NECK Plaque carotid bifurcation / proximal internal carotid artery bilaterally with maximal 50% diameter stenosis proximal internal carotid artery bilaterally. Right vertebral artery is slightly larger than left. No significant stenosis of the cervical segment of the vertebral arteries on either side. Small to slightly moderate-size bilateral  pleural effusions with adjacent passive atelectasis. Endotracheal tube and nasogastric tube in place. CTA HEAD Mild narrowing irregularity M1 segment left middle cerebral artery. Mild narrowing right middle cerebral artery bifurcation. Calcified plaque with mild narrowing cavernous segment internal carotid artery bilaterally. Narrowing of the left vertebral artery from the foramen magnum to formation of the basilar artery. Poor delineation of the left posterior inferior cerebellar artery and appears occluded. No significant stenosis of the distal right vertebral artery. Mild irregularity of the basilar artery without significant stenosis. Irregular anterior inferior cerebellar arteries bilaterally. Fetal type contribution to formation of the left posterior cerebral artery. No high-grade stenosis of the posterior cerebral artery however, distal branch vessel narrowing and irregularity greater on the right. Electronically Signed   By: Lacy Duverney M.D.   On: 03/06/2015 08:04  Ct Angio Up Extrem Left W/cm &/or Wo/cm  02/27/2015  CLINICAL DATA:  82 year old female with left arm pain and heaviness. EXAM: CT ANGIOGRAPHY UPPER LEFT EXTREMITY TECHNIQUE: Transaxial CT of the left upper extremity from the level of the clavicle technique timed was performed in 3 mm slice thickness. Coronal and sagittal reformatted images were provided. The study was performed following intravenous administration of 75 cc Omnipaque 350 CONTRAST:  75mL OMNIPAQUE IOHEXOL 350 MG/ML SOLN COMPARISON:  Go technique FINDINGS: Evaluation of the distal extremity vasculature is limited due to streak artifact caused by patient's body. The aortic arch and the origins of the great vessels appear unremarkable. There is segmental wall thickening with luminal narrowing of the left subclavian artery extending into the left axillary artery and proximal brachial artery. There is high-grade narrowing of the subclavian and axillary segment of the vessel. The  distal brachial artery is patent. There is bifurcation of the brachial artery approximately 4.5 cm distal to the elbow. The proximal portion of the radial and ulnar artery appear patent. Evaluation of the distal portion of this vessel is very limited due to suboptimal opacification. There is however apparent diminished flow within the distal radial and ulna are artery concerning for underlying vascular disease. Correlation with clinical exam and duplex ultrasound recommended. The soft tissues of the left upper extremity appear unremarkable. There is no left axillary adenopathy. Linear left lung base atelectatic changes/scarring noted. A 13 mm indeterminate left adrenal nodule. Multiple left renal hypodense lesions, incompletely characterized, possibly parenchymal and parapelvic cysts. Ultrasound may provide better evaluation. There is diverticulosis of the visualized colon. No acute fracture. Review of the MIP images confirms the above findings. IMPRESSION: Segmental narrowing of the left subclavian, axillary, and proximal brachial arteries. Limited evaluation of the mid and distal portion of the radial and ulna arteries due to suboptimal opacification concerning for diminished flow related to underlying peripheral vascular disease. Correlation with clinical exam and further evaluation with duplex ultrasound recommended. Electronically Signed   By: Elgie Collard M.D.   On: 02/27/2015 23:55   Mr Laqueta Jean WU Contrast  03/03/2015  CLINICAL DATA:  82 year old hypertensive female with altered mental status and facial weakness. Subsequent encounter. EXAM: MRI HEAD WITHOUT AND WITH CONTRAST TECHNIQUE: Multiplanar, multiecho pulse sequences of the brain and surrounding structures were obtained without and with intravenous contrast. CONTRAST:  18mL MULTIHANCE GADOBENATE DIMEGLUMINE 529 MG/ML IV SOLN COMPARISON:  02/26/2014. FINDINGS: Large acute hemorrhagic left mid to inferior cerebellar infarct with associated mass  effect compressing the fourth ventricle. Interval slight increase in size of ventricles compared to recent CT consistent with developing hydrocephalus. Patient is un safe for lumbar puncture. Mild small vessel disease changes. No intracranial mass separate from above described findings. Fast technique imaging had to be utilized. Left vertebral artery is at least partially patent. Right vertebral artery, basilar artery and internal carotid arteries are patent. Limited assessment of the left posterior inferior cerebellar artery. 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 Interval slight increase in size of ventricles compared to recent CT consistent with developing hydrocephalus. Left vertebral artery is at least partially patent. Limited assessment of the left posterior inferior cerebellar artery. These results will be called to the ordering clinician or representative by the Radiologist Assistant, and communication documented in the PACS or zVision Dashboard. Electronically Signed   By: Lacy Duverney M.D.   On: 03/03/2015 16:55   Dg Chest [facility]  03/09/2015  CLINICAL DATA:  Shortness of breath. EXAM: PORTABLE CHEST 1 VIEW COMPARISON:  03/07/2015. FINDINGS: Mediastinum hilar structures normal. Cardiomegaly with normal pulmonary vascularity. Interval near complete clearing of bilateral pulmonary infiltrates/edema. Small right pleural effusion cannot be excluded. Left costophrenic angle not imaged. No pneumothorax. IMPRESSION: Stable cardiomegaly with interim near complete resolution of bilateral pulmonary edema and/or infiltrates . Electronically Signed   By: Maisie Fus  Register   On: 03/09/2015 07:33   Dg Chest Port 1 View  03/07/2015  CLINICAL DATA:  Pulmonary edema. EXAM: PORTABLE CHEST 1 VIEW COMPARISON:  03/06/2015. FINDINGS: Interim extubation and removal of NG tube. Cardiomegaly with persistent bilateral  pulmonary infiltrates suggesting pulmonary edema. Slight interim clearing. Persistent low lung volumes with basilar atelectasis. No prominent pleural effusion or pneumothorax. IMPRESSION: 1. Interim removal of endotracheal tube and NG tube. 2. Cardiomegaly with persistent bilateral pulmonary infiltrates consistent with pulmonary edema. Slight improvement from prior exam. Persistent low lung volumes with mild basilar atelectasis. Electronically Signed   By: Maisie Fus  Register   On: 03/07/2015 07:24   Dg Chest Port 1 View  03/06/2015  CLINICAL DATA:  Pneumonia. EXAM: PORTABLE CHEST 1 VIEW COMPARISON:  03/05/2015. FINDINGS: Endotracheal tube and NG tube in stable position. Cardiomegaly with diffuse bilateral pulmonary infiltrates again noted. Findings consistent with congestive heart failure. No change from prior exam. No prominent pleural effusion or pneumothorax . Degenerative changes thoracic spine. IMPRESSION: 1. Lines and tubes in stable position. 2. Congestive heart failure with bilateral pulmonary edema, no change from prior exam. Bilateral pneumonia cannot be excluded. Electronically Signed   By: Maisie Fus  Register   On: 03/06/2015 07:29   Dg Chest Port 1 View  03/05/2015  CLINICAL DATA:  Intubation. EXAM: PORTABLE CHEST 1 VIEW COMPARISON:  03/03/2015. FINDINGS: Endotracheal tube and NG tube in stable position. Cardiomegaly with diffuse bilateral pulmonary alveolar infiltrates, improved from prior exam. No pleural effusion or pneumothorax. IMPRESSION: 1. Lines and tubes in stable position. 2. Cardiomegaly with bilateral pulmonary infiltrates consistent with pulmonary edema. Interim improvement from prior exam. Electronically Signed   By: Maisie Fus  Register   On: 03/05/2015 07:14   Portable Chest Xray  03/03/2015  CLINICAL DATA:  Admission chest radiograph. Known hemorrhagic infarct. Initial encounter. EXAM: PORTABLE CHEST 1 VIEW COMPARISON:  Chest radiograph performed earlier today at 6:39 p.m. FINDINGS: The  patient's endotracheal tube is seen ending 2-3 cm above the carina. An enteric tube is noted extending below the diaphragm. Bilateral central airspace opacity raises concern for pulmonary edema. No pleural effusion or pneumothorax is seen. The cardiomediastinal silhouette is mildly enlarged. No acute osseous abnormalities are identified. IMPRESSION: 1. Endotracheal tube seen ending 2-3 cm above the carina. 2. Bilateral central airspace opacity is perhaps slightly worsened and raises concern for pulmonary edema. Mild cardiomegaly. Electronically Signed   By: Roanna Raider M.D.   On: 03/03/2015 23:00   Dg Chest Port 1 View  03/03/2015  CLINICAL DATA:  82 year old female being transferred to [facility] , intubated and sedated now. Cough and shortness of breath. Initial encounter. EXAM: PORTABLE CHEST 1 VIEW COMPARISON:  [facility] 0022 hours today. FINDINGS: Portable AP semi upright view at 1839 hours. Endotracheal tube tip in good position between the level the clavicles and carina. Enteric tube courses to the left upper quadrant, tip not included. Interval worsening confluence of right lung base opacity, favor lower lobe. Upper lobes appear stable. Left lung base is stable and probably unaffected. No superimposed pneumothorax  or pulmonary edema. No pleural effusion identified. IMPRESSION: 1. Endotracheal tube tip in good position. Enteric tube courses to the abdomen. 2. Radiographic progression of right lung base pneumonia and/or aspiration since earlier today. No pleural effusion identified. Electronically Signed   By: Odessa Fleming M.D.   On: 03/03/2015 19:04    Jeoffrey Massed, MD  [facility] Pager:[phone]  If 7PM-7AM, please contact night-coverage [email or web address] Password [email or web address] 03/11/2015, 11:05 AM   LOS: 8 days

## 2015-03-11 NOTE — Progress Notes (Signed)
STROKE TEAM PROGRESS NOTE   SUBJECTIVE (INTERVAL HISTORY) No family members present. I contacted the patient's sister by telephone. Sister stated that after the subclavian artery thrombectomy, she understood that pt should take eliquis forever. Pt had EGD last night showed single gastric ulcer no more bleeding. On PPI. Recommend at least one week without antiplatelet or anticoagulation. The patient is tentatively scheduled for a TEE in the morning. We are awaiting records from [facility] so that she may not need TEE/loop.   OBJECTIVE Temp:  [97.3 F (36.3 C)-98.1 F (36.7 C)] 98 F (36.7 C) (01/29 1339) Pulse Rate:  [65-72] 66 (01/29 1339) Cardiac Rhythm:  [-]  Resp:  [16-18] 18 (01/29 1339) BP: (127-153)/(55-69) 145/62 mmHg (01/29 1339) SpO2:  [95 %-100 %] 100 % (01/29 1339) Weight:  [84.46 kg (186 lb 3.2 oz)] 84.46 kg (186 lb 3.2 oz) (01/29 0500)  CBC:   Recent Labs Lab 03/07/15 0446 03/08/15 0657  03/10/15 0740 03/11/15 0504  WBC 8.7 8.4  --  10.1 9.3  NEUTROABS 7.2 6.3  --   --   --   HGB 10.9* 11.4*  < > 9.9* 8.9*  HCT 32.6* 34.2*  < > 29.8* 27.5*  MCV 89.8 89.5  --  89.8 91.1  PLT 299 314  --  422* 396  < > = values in this interval not displayed.  Basic Metabolic Panel:   Recent Labs Lab 03/07/15 0446 03/08/15 0657 03/09/15 0454 03/11/15 1050  NA 140 138 140 139  K 3.5 3.5 4.0 4.0  CL 97* 103 102 104  CO2 GLUCOSE 149* 122* 154* 149*  BUN 13 22* 44* 38*  CREATININE 0.84 0.86 1.06* 0.97  CALCIUM 8.5* 9.0 9.2 9.0  MG 1.9 2.1  --   --   PHOS 4.0 3.7  --   --     Lipid Panel:     Component Value Date/Time   CHOL 144 03/10/2015 0900   TRIG 128 03/10/2015 0900   HDL 30* 03/10/2015 0900   CHOLHDL 4.8 03/10/2015 0900   VLDL 26 03/10/2015 0900   LDLCALC 88 03/10/2015 0900   HgbA1c:  Lab Results  Component Value Date   HGBA1C 6.6* 02/27/2015   Urine Drug Screen: No results found for: LABOPIA, COCAINSCRNUR, LABBENZ,  AMPHETMU, THCU, LABBARB    IMAGING I have personally reviewed the radiological images below and agree with the radiology interpretations.  Mr Nancy Hahn Wo Contrast 03/03/2015   Large acute hemorrhagic left mid to inferior cerebellar infarct with associated mass effect compressing the fourth ventricle. Interval slight increase in size of ventricles compared to recent CT consistent with developing hydrocephalus. Left vertebral artery is at least partially patent. Limited assessment of the left posterior inferior cerebellar artery.   CTA head and neck CT HEAD Large left cerebellar infarct with hemorrhage better appreciated on recently performed MR. Mass-effect upon the adjacent structures including narrowing of the fourth ventricle and inferior aspect of the aqueduct has progressed slightly since the prior exam (series 201, image 10). Dilated lateral ventricles/ mild hydrocephalus similar to prior exam (best appreciated when compared to remote CT).  CTA NECK Plaque carotid bifurcation / proximal internal carotid artery bilaterally with maximal 50% diameter stenosis proximal internal carotid artery bilaterally. Right vertebral artery is slightly larger than left. No significant stenosis of the cervical segment of the vertebral arteries on either side. Small to slightly moderate-size bilateral pleural effusions with adjacent passive atelectasis. Endotracheal tube and nasogastric tube  in place.  CTA HEAD Mild narrowing irregularity M1 segment left middle cerebral artery. Mild narrowing right middle cerebral artery bifurcation. Calcified plaque with mild narrowing cavernous segment internal carotid artery bilaterally. Narrowing of the left vertebral artery from the foramen magnum to formation of the basilar artery. Poor delineation of the left posterior inferior cerebellar artery and appears occluded. No significant stenosis of the distal right vertebral artery. Mild irregularity of the  basilar artery without significant stenosis. Irregular anterior inferior cerebellar arteries bilaterally. Fetal type contribution to formation of the left posterior cerebral artery. No high-grade stenosis of the posterior cerebral artery however, distal branch vessel narrowing and irregularity greater on the right.  Head CT without contrast 03/10/2015 Expected evolutionary changes of the inferior left cerebellar infarction.  Less swelling and mass effect than on the previous studies.  No evidence of complication such as hemorrhage.  No new infarction.  Upper endoscopy 03/10/2015  IMPRESSION: 1. Single ulcer in the gastric body 2. Erosive gastritis in the gastric body 3. The EGD otherwise appeared normal  TTE  - Left ventricle: The cavity size was normal. Systolic function was normal. The estimated ejection fraction was in the range of 55% to 60%. Wall motion was normal; there were no regional wall motion abnormalities. Features are consistent with a pseudonormal left ventricular filling pattern, with concomitant abnormal relaxation and increased filling pressure (grade 2 diastolic dysfunction). - Mitral valve: Mildly calcified annulus. There was mild regurgitation. - Left atrium: The atrium was mildly dilated.   PHYSICAL EXAM Frail elderly lady   . Afebrile. Head is nontraumatic. Neck is supple without bruit. Cardiac exam no murmur or gallop. Lungs are clear to auscultation. Distal pulses are well felt. Neurological Exam :  Awake and alert, mild lethargy, orientated to time, place and people. PERRL. fundi not visualized. Vision acuity and fields seem adequate. Extraocular movements are full range without nystagmus. Mild saccadic dysmetria on left gaze Face symmetric. Opens eyes to light stimulation.   Can follow simple commands, nods to questions. No gaze deviation. Does track. Conjugate gaze.No nystagmus. moves all extremities equally. Tongue midline. Cough and  gag intact. No extremity drift or focal weakness. Mild left finger-to-nose dysmetria. Sensation appears intact bilaterally. Toes equivocal.  Gait deferred Gait: Unable to test   ASSESSMENT/PLAN [name] is a 82 y.o. 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 She did not receive IV t-PA due to hemorrhage.   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 Unlikely due to L VA embolism from subclavian thrombosis as it is distal to [place] take off  Resultant  Mild left UE dysmetria  MRI  - Large left cerebellar infarct with small hemorrhagic transformation.  Repeat Head CT - 03/10/2015 - less swelling. Expected evolution of stroke.   CTA head and neck - left PICA occlusion  2D Echo - EF 55-60%. No cardiac source of emboli identified.  LDL - 88  HgbA1c - pending  VTE prophylaxis - SCDs  Diet - low sodium heart healthy Diet Carb Modified Diet full liquid Room service appropriate?: Yes; Fluid consistency:: Thin Diet NPO time specified  aspirin 81 mg daily prior to admission, now on No antithrombotic secondary to hemorrhage.  Ongoing aggressive stroke risk factor management  Therapy recommendations: Skilled nursing facility placement recommended - not approved for CIR.  Disposition:  Pending  Left subclavian artery  thrombosis, suspicious for afib  S/p thrombectomy  Was on eliquis, stopped due to hemorrhagic transformation  May consider to resume in one week as per GI  GIB  Bloody vomitus   EGD done showed single gastric ulcer and gastritis  On protonix injection  Hold off anticoagulation or antiplatelet in 5-7 days.   CBC monitoring  Hypertension  Blood pressure tends to run low  Permissive hypertension   Hyperlipidemia  Zocor 20 mg  daily prior to admission - resumed  LDL 88 - Goal < 70  Increase Zocor to 40 mg daily  Continue statin at time of discharge  Other Stroke Risk Factors  Advanced age   Other Active Problems  Anemia - appears stable  Renal insufficiency - check again on Monday - recently started on lisinopril   PLAN  Possible TEE and loop tomorrow  Awaiting records from [facility]. If plan is for long term AC post op, no need for TEE and loop  Marvel Plan, MD PhD Stroke Neurology 03/11/2015 10:26 PM    To contact Stroke Continuity provider, please refer to [email or web address]. After hours, contact General Neurology

## 2015-03-11 NOTE — Progress Notes (Signed)
Occupational Therapy Treatment Patient Details Name: Nancy Hahn MRN: [id number] DOB: 02-Feb-1932 Today's Date: 03/11/2015    History of present illness pt presents with L Cerebellar Hemorrhage with mass effect on 4th ventricle.  pt with recent L Subclavian Artery thrombectomy on 02/28/15 and was put on blood thinners.  pt with hx of HTN and recent PNA.     OT comments  Pt progressing towards acute OT goals. Focus of session was toilet transfer and grooming tasks sitting EOB. Pt was min to mod A with functional transfers.  Pt + for dizziness in standing position, reports rocking/swaying sensation (vs. spinning). D/c plan updated to SNF as pt was declined for CIR. OT to continue to follow acutely.   Follow Up Recommendations  SNF;Other (comment) (Pt was declined for CIR. )    Equipment Recommendations  Other (comment) (defer to next venue)    Recommendations for Other Services      Precautions / Restrictions Precautions Precautions: Fall Restrictions Weight Bearing Restrictions: No       Mobility Bed Mobility Overal bed mobility: Needs Assistance Bed Mobility: Supine to Sit;Sit to Supine     Supine to sit: Min assist;HOB elevated Sit to supine: Min guard   General bed mobility comments: Min A to powerup trunk to EOB. Able to scoot up in bed with cueing provided for technique.   Transfers Overall transfer level: Needs assistance Equipment used: 1 person hand held assist Transfers: Sit to/from [employer] Sit to Stand: Min assist;From elevated surface Stand pivot transfers: Mod assist       General transfer comment: Min - mod steadying assist needed. Heavy utilization of external support (armrest of BSC, bed rail). Trunk flexion and wide BOS.    Balance Overall balance assessment: Needs assistance Sitting-balance support: Single extremity supported;Feet supported Sitting balance-Leahy Scale: Poor Sitting balance - Comments: single extremity support  during ADL tasks.    Standing balance support: Single extremity supported Standing balance-Leahy Scale: Poor Standing balance comment: bed rail utilized to static stand EOB about 1 minute. Trunk flexion and wide BOS noted. + dizziness.                   ADL Overall ADL's : Needs assistance/impaired     Grooming: Set up;Supervision/safety;Wash/dry hands;Wash/dry face;Oral care;Brushing hair;Sitting Grooming Details (indicate cue type and reason): Pt combed hair with both right and left UEs. Grooming tasks completed with single extremity support sitting EOB.                  Toilet Transfer: Moderate assistance;Stand-pivot;Cueing for safety;BSC Toilet Transfer Details (indicate cue type and reason): heavy use of external support (bed rails, armrest). Pt in trunk flexion during SPT but able to stand more erect in static standing in front of bed with rail for support.           General ADL Comments: Ataxia noted during dynamic balance activities. Sat EOB with single extremity support to complete grooming tasks. Heavy use of external support during SPT and + increased dizziness in standing position. Pt stood EOB with encouragement for about 1 minute. Pt able to complete scooting up in bed with cueing for technique.      Vision                     Perception     Praxis      Cognition   Behavior During Therapy: Flat affect Overall Cognitive Status: No family/caregiver present to determine baseline cognitive  functioning                       Extremity/Trunk Assessment               Exercises     Shoulder Instructions       General Comments      Pertinent Vitals/ Pain       Pain Assessment: No/denies pain  Home Living                                          Prior Functioning/Environment              Frequency Min 2X/week     Progress Toward Goals  OT Goals(current goals can now be found in the care plan  section)  Progress towards OT goals: Progressing toward goals  Acute Rehab OT Goals Patient Stated Goal: To go home OT Goal Formulation: With patient Time For Goal Achievement: 03/21/15 Potential to Achieve Goals: Good ADL Goals Pt Will Perform Eating: Independently;sitting Pt Will Perform Grooming: with supervision;standing Pt Will Perform Upper Body Bathing: with supervision;sitting Pt Will Perform Lower Body Bathing: sit to/from stand;with supervision Pt Will Transfer to Toilet: with min guard assist;stand pivot transfer;bedside commode Pt Will Perform Toileting - Clothing Manipulation and hygiene: with supervision;sit to/from stand  Plan Discharge plan remains appropriate    Co-evaluation                 End of Session Equipment Utilized During Treatment: Gait belt   Activity Tolerance Patient tolerated treatment well;Other (comment) (dizzy in standing)   Patient Left in bed;with call bell/phone within reach;with SCD's reapplied   Nurse Communication          Time: 7829-5621 OT Time Calculation (min): 18 min  Charges: OT General Charges $OT Visit: 1 Procedure OT Treatments $Self Care/Home Management : 8-22 mins  Pilar Grammes 03/11/2015, 12:15 PM

## 2015-03-11 NOTE — Progress Notes (Addendum)
  Gastroenterology Progress Note covering for Drs. Mann & Hung  Subjective:  Still having nausea and just vomited small amount yellowish/green liquid; no blood.  Objective:  Vital signs in last 24 hours: Temp:  [97.3 F (36.3 C)-98.1 F (36.7 C)] 97.9 F (36.6 C) (01/29 0904) Pulse Rate:  [60-80] 72 (01/29 0904) Resp:  [14-32] 18 (01/29 0904) BP: (112-162)/(42-70) 127/58 mmHg (01/29 0904) SpO2:  [95 %-100 %] 100 % (01/29 0904) Weight:  [186 lb 3.2 oz (84.46 kg)] 186 lb 3.2 oz (84.46 kg) (01/29 0500) Last BM Date: 03/09/15 General:  Alert, Well-developed, in NAD Heart:  Regular rate and rhythm; no murmurs Pulm:  CTAB.  No W/R/R. Abdomen:  Soft, non-distended. Normal bowel sounds.  Non-tender. Extremities:  Without edema. Neurologic:  Alert and oriented x 4;  grossly normal neurologically. Psych:  Alert and cooperative. Normal mood and affect.  Intake/Output from previous day: 01/28 0701 - 01/29 0700 In: 360 [P.O.:360] Out: -   Lab Results:  Recent Labs  03/10/15 0401 03/10/15 0740 03/11/15 0504  WBC  --  10.1 9.3  HGB 9.9* 9.9* 8.9*  HCT 30.9* 29.8* 27.5*  PLT  --  422* 396   BMET  Recent Labs  03/09/15 0454  NA 140  K 4.0  CL 102  CO2 25  GLUCOSE 154*  BUN 44*  CREATININE 1.06*  CALCIUM 9.2   Ct Head Wo Contrast  03/10/2015  CLINICAL DATA:  Recent stroke with left-sided weakness and slurred speech. New frontal headache. EXAM: CT HEAD WITHOUT CONTRAST TECHNIQUE: Contiguous axial images were obtained from the base of the skull through the vertex without intravenous contrast. COMPARISON:  03/06/2015 and previous FINDINGS: Go low-density persists in the inferior cerebellum on the left, but swelling is subsidence slightly. Less mass effect upon the fourth ventricle. No evidence of developing hemorrhage. No new insult. Cerebral hemispheres are normal. Ventricular size is stable. No extra-axial collection. Sinuses, middle ears and mastoids are clear. IMPRESSION:  Expected evolutionary changes of the inferior left cerebellar infarction. Less swelling and mass effect than on the previous studies. No evidence of complication such as hemorrhage. No new infarction. Electronically Signed   By: Paulina Fusi M.D.   On: 03/10/2015 13:12   Assessment / Plan: 82 year old female who was admitted with a large acute hemorrhagic infarct who had hematemesis in the form of large clots. EGD on 1/28 showed erosive gastritis and a single gastric ulcer.  Hgb down about 1 gram this AM but no sign of overt bleeding.  Continue BID PPI.  Monitor Hgb and transfuse prn.  Will check Hpylori Ab and if positive then will need treated.  Avoid ASA/NSAIDs.  Antiemetics for nausea and vomiting.   LOS: 8 days   ZEHR, JESSICA D.  03/11/2015, 10:09 AM  Pager number [phone]     Attending physician's note   I have taken a history, examined the patient and reviewed the chart. I agree with the Advanced Practitioner's note, impression and recommendations. Some N/V today but no recurrent bleeding. Hb is likely equilibrating. Change Zofran IV to RTC, not prn, for now. Continue PPI BID. Check H. pylori Ab and treat if positive. Avoid ASA/NSAIDs. Recommend avoiding anticoagulant and antiplatelet medications for at least 5-7 days. Drs. Mann & Elnoria Howard to assume GI care on Monday.    Claudette Head, MD [facility] [phone] Mon-Fri 8a-5p [phone] after 5p, weekends, holidays

## 2015-03-12 ENCOUNTER — Encounter: Payer: Self-pay | Admitting: Nurse Practitioner

## 2015-03-12 ENCOUNTER — Other Ambulatory Visit: Payer: Self-pay | Admitting: Physician Assistant

## 2015-03-12 ENCOUNTER — Encounter (HOSPITAL_COMMUNITY): Payer: Self-pay | Admitting: Gastroenterology

## 2015-03-12 ENCOUNTER — Encounter (HOSPITAL_COMMUNITY)
Admission: AD | Disposition: A | Discharge status: Skilled Nursing Facility | Payer: Commercial Managed Care - HMO | Source: Other Acute Inpatient Hospital | Attending: Internal Medicine

## 2015-03-12 ENCOUNTER — Inpatient Hospital Stay (HOSPITAL_COMMUNITY): Payer: Commercial Managed Care - HMO

## 2015-03-12 DIAGNOSIS — I6340 Cerebral infarction due to embolism of unspecified cerebral artery: Secondary | ICD-10-CM

## 2015-03-12 LAB — GLUCOSE, CAPILLARY
GLUCOSE-CAPILLARY: 115 mg/dL — AB (ref 65–99)
GLUCOSE-CAPILLARY: 106 mg/dL — AB (ref 65–99)
Glucose-Capillary: 109 mg/dL — ABNORMAL HIGH (ref 65–99)
Glucose-Capillary: 132 mg/dL — ABNORMAL HIGH (ref 65–99)

## 2015-03-12 LAB — HEMOGLOBIN A1C
Hgb A1c MFr Bld: 6.7 % — ABNORMAL HIGH (ref 4.8–5.6)
MEAN PLASMA GLUCOSE: 146 mg/dL

## 2015-03-12 LAB — CBC
HEMATOCRIT: 27.9 % — AB (ref 36.0–46.0)
Hemoglobin: 8.8 g/dL — ABNORMAL LOW (ref 12.0–15.0)
MCH: 28.8 pg (ref 26.0–34.0)
MCHC: 31.5 g/dL (ref 30.0–36.0)
MCV: 91.2 fL (ref 78.0–100.0)
PLATELETS: 418 10*3/uL — AB (ref 150–400)
RBC: 3.06 MIL/uL — ABNORMAL LOW (ref 3.87–5.11)
RDW: 13.6 % (ref 11.5–15.5)
WBC: 7.9 10*3/uL (ref 4.0–10.5)

## 2015-03-12 SURGERY — CANCELLED PROCEDURE

## 2015-03-12 MED ORDER — FENTANYL CITRATE (PF) 100 MCG/2ML IJ SOLN
INTRAMUSCULAR | Status: AC
  Filled 2015-03-12: qty 2

## 2015-03-12 MED ORDER — SODIUM CHLORIDE 0.9 % IV SOLN
INTRAVENOUS | Status: DC
Start: 2015-03-12 — End: 2015-03-12

## 2015-03-12 MED ORDER — FENTANYL CITRATE (PF) 100 MCG/2ML IJ SOLN
INTRAMUSCULAR | Status: DC | PRN
  Administered 2015-03-12 (×2): 25 ug via INTRAVENOUS

## 2015-03-12 MED ORDER — MIDAZOLAM HCL 5 MG/ML IJ SOLN
INTRAMUSCULAR | Status: AC
  Filled 2015-03-12: qty 1

## 2015-03-12 MED ORDER — MIDAZOLAM HCL 10 MG/2ML IJ SOLN
INTRAMUSCULAR | Status: DC | PRN
  Administered 2015-03-12 (×2): 2 mg via INTRAVENOUS
  Administered 2015-03-12: 1 mg via INTRAVENOUS

## 2015-03-12 NOTE — Progress Notes (Signed)
TEE aborted due to lack of IV access and pt not being able to be sedated for procedure. Pt's IV was taken out and multiple attempts to start a new IV were tried, unsuccessfully. Pt has left arm restricted. Pt was not being sedated after 50 mcg of Fen and 5 mg of Versed. Pt talking and responding during the entire process. IV team consult has been put in the computer and procedure scheduled for tomorrow per MD.

## 2015-03-12 NOTE — Care Management Important Message (Signed)
Important Message  Patient Details  Name: Nancy Hahn MRN: [id number] Date of Birth: April 02, 1931   Medicare Important Message Given:  Yes    Oralia Rud Noora Locascio 03/12/2015, 2:53 PM

## 2015-03-12 NOTE — Progress Notes (Signed)
UNASSIGNED PATIENT Subjective: Chart reviewed. Patient has just returned from the endoscopy lab where a TEE was planned but could not be done due to lack of access. She is eating a regular meal and denies having any more problems with bleeding. She denies having any abdominal pain, nausea or vomiting.  Objective: Vital signs in last 24 hours: Temp:  [97.5 F (36.4 C)-98.7 F (37.1 C)] 98.2 F (36.8 C) (01/30 1829) Pulse Rate:  [62-81] 81 (01/30 1829) Resp:  [11-18] 16 (01/30 1829) BP: (113-171)/(38-75) 113/38 mmHg (01/30 1829) SpO2:  [92 %-100 %] 97 % (01/30 1829) Weight:  [83.507 kg (184 lb 1.6 oz)] 83.507 kg (184 lb 1.6 oz) (01/30 0500) Last BM Date: 03/11/15  Intake/Output from previous day:   Intake/Output this shift:    General appearance: alert, cooperative, appears stated age and no distress Resp: clear to auscultation bilaterally Cardio: regular rate and rhythm, S1, S2 normal, no murmur, click, rub or gallop GI: soft, non-tender; bowel sounds normal; no masses,  no organomegaly  Lab Results:  Recent Labs  03/10/15 0740 03/11/15 0504 03/12/15 0440  WBC 10.1 9.3 7.9  HGB 9.9* 8.9* 8.8*  HCT 29.8* 27.5* 27.9*  PLT 422* 396 418*   BMET  Recent Labs  03/11/15 1050  NA 139  K 4.0  CL 104  CO2 26  GLUCOSE 149*  BUN 38*  CREATININE 0.97  CALCIUM 9.0   Medications: I have reviewed the patient's current medications.  Assessment/Plan: 1) Upper GI bleed/Gastric ulcer-resolved. 2) Anemia due to acute blood loss. Monitor closely.  3) Subclavian artery thrombosis.  . LOS: 9 days   Sanjay Broadfoot 03/12/2015, 6:35 PM

## 2015-03-12 NOTE — Progress Notes (Signed)
SUBJECTIVE: The patient is doing well today.  At this time, she denies chest pain, shortness of breath, or any new concerns.  . carvedilol  6.25 mg Oral BID WC  . feeding supplement (ENSURE ENLIVE)  237 mL Oral BID BM  . hydrALAZINE  50 mg Oral 3 times per day  . insulin aspart  0-9 Units Subcutaneous TID WC  . ondansetron (ZOFRAN) IV  4 mg Intravenous 4 times per day  . pantoprazole (PROTONIX) IV  40 mg Intravenous Q12H  . simvastatin  40 mg Oral q1800      OBJECTIVE: Physical Exam: Filed Vitals:   03/12/15 0500 03/12/15 0529 03/12/15 0755 03/12/15 0957  BP:  132/54 140/47 116/55  Pulse:  66 64 62  Temp:  97.5 F (36.4 C)  97.8 F (36.6 C)  TempSrc:  Oral  Oral  Resp:  18  15  Height:      Weight: 184 lb 1.6 oz (83.507 kg)     SpO2:  97%  99%   No intake or output data in the 24 hours ending 03/12/15 1110  Telemetry reveals not currently on telemetry  GEN- The patient is well appearing, alert and oriented x 3 today.   Head- normocephalic, atraumatic Eyes-  Sclera clear, conjunctiva pink Ears- hearing intact Oropharynx- clear Neck- supple, no JVP Lungs- Clear to ausculation bilaterally, normal work of breathing Heart- Regular rate and rhythm, no significant murmurs, no rubs or gallops GI- soft, NT, ND Extremities- no clubbing, cyanosis, or edema Skin- no rash or lesion Psych- euthymic mood, full affect   LABS: Basic Metabolic Panel:  Recent Labs  16/10/96 1050  NA 139  K 4.0  CL 104  CO2 26  GLUCOSE 149*  BUN 38*  CREATININE 0.97  CALCIUM 9.0   CBC:  Recent Labs  03/11/15 0504 03/12/15 0440  WBC 9.3 7.9  HGB 8.9* 8.8*  HCT 27.5* 27.9*  MCV 91.1 91.2  PLT 396 418*   Hemoglobin A1C:  Recent Labs  03/10/15 0900  HGBA1C 6.7*   Fasting Lipid Panel:  Recent Labs  03/10/15 0900  CHOL 144  HDL 30*  LDLCALC 88  TRIG 045  CHOLHDL 4.8   Thyroid Function Tests:  Recent Labs  03/10/15 0900  TSH 2.029   Anemia Panel:  Recent  Labs  03/10/15 0900  VITAMINB12 472   02/28/15: Echocardiogram Study Conclusions - Left ventricle: The cavity size was normal. Systolic function was normal. The estimated ejection fraction was in the range of 55% to 60%. Wall motion was normal; there were no regional wall motion abnormalities. Features are consistent with a pseudonormal left ventricular filling pattern, with concomitant abnormal relaxation and increased filling pressure (grade 2 diastolic dysfunction). - Mitral valve: Mildly calcified annulus. There was mild regurgitation. - Left atrium: The atrium was mildly dilated.  Impressions:  - No cardiac source of emboli was indentified.    ASSESSMENT AND PLAN:   1. 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      Unlikely due to L VA embolism from subclavian thrombosis as it is distal to VA take off     MRI - Large left cerebellar infarct with small hemorrhagic transformation.     Repeat Head CT - 03/10/2015 - less swelling. Expected evolution of stroke.      03/03/15 MR describes large acute hemorrhagic infacrt  2. Left subclavian artery  thrombosis, suspicious for afib  S/p open embolectomy LUE 02/28/15 by vascular surgery, Dr. Gilda Crease  S/p mechanical thrombectomy and infusion of TPA LUE 02/28/15 (followed by open embolectomy)  Was on heparin then eliquis, stopped due to hemorrhagic transformation    Per neuro noted: May consider to resume in one week as per GI  3. GIB  Bloody vomitus   EGD done showed single gastric ulcer and gastritis  On protonix injection  per neuro noted: Hold off anticoagulation or antiplatelet in 5-7 days.   From EP standpoint, the patient was previously evaluated by our service for possible loop implant, at that time on telemetry she has been observed to have atrial tachycardia and likely need for long term anticoagulation with  LUE embolic event a 30 day monitor was recommended. Follow up with EP APP out patient in 6 weeks to discuss monitor results.   Francis Dowse, PA-C 03/12/2015 11:10 AM   I have seen and examined this patient with Francis Dowse.  Agree with above, note added to reflect my findings.  On exam, regular rhythm, no murmurs, lungs clear.  Has had AT and APCs on the monitor. Her likelihood of having AF is high at this point.  Agree with 30 day monitor and follow up in clinic in 6 weeks.  As there has not been AF in the hospital, she does not need to be anticoagulated to prevent from CVA from AF at this time.  That being said, she has had two embolic events and may benefit from anticoagulation to prevent further events, although risks are higher due to prior ICH.    Marnisha Stampley M. Dakarai Mcglocklin MD 03/13/2015 3:22 PM

## 2015-03-12 NOTE — H&P (Signed)
     INTERVAL PROCEDURE H&P  History and Physical Interval Note:  03/12/2015 2:47 PM  Nancy Hahn has presented today for their planned procedure. The various methods of treatment have been discussed with the patient and family. After consideration of risks, benefits and other options for treatment, the patient has consented to the procedure.  The patients' outpatient history has been reviewed, patient examined, and no change in status from most recent office note within the past 30 days. I have reviewed the patients' chart and labs and will proceed as planned. Questions were answered to the patient's satisfaction.   Chrystie Nose, MD, [facility] Attending Cardiologist [facility]  Chrystie Nose 03/12/2015, 2:47 PM

## 2015-03-12 NOTE — Progress Notes (Signed)
PT Cancellation Note  Patient Details Name: Nancy Hahn MRN: [id number] DOB: [birth year]   Cancelled Treatment:    Reason Eval/Treat Not Completed: Patient at procedure or test/unavailable. Per RN, recently left for TEE.   [name] 03/12/2015, 2:27 PM  Pager [phone]

## 2015-03-12 NOTE — Progress Notes (Addendum)
PATIENT DETAILS Name: Nancy Hahn Age: 82 y.o. Sex: female Date of Birth: 1932/01/31 Admit Date: 03/03/2015 Admitting Physician Kalman Shan, MD [id number], Lavada Mesi, MD  Brief narrative 82 y/o woman who was admitted to [facility] on 1/18 w/ cold and painful LUE. 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 She was treated with heparin and then transitioned to Elliquis. On the morning of 1/21 she was unable to maintain her balance, slurred speech, and L facial droop. An MRI was was obtained which showed a large cerebral stroke w/ evidence of hemorrhage and concern for developing hydrocephalous. She was subsequently intubated, and transferred to [facility]. Once stabilized/extubated, transferred to [facility] hospitalist service on 1/26.  Subjective: Awake, alert.No further vomiting  Assessment/Plan: Active Problems:  Acute ischemic stroke with hemorrhagic conversion: Improving, no focal deficits seen on exam. MRI brain showed a Large acute hemorrhagic left mid to inferior cerebellar infarct with associated mass effect. CTA neck showed approximately 50% stenosis bilaterally. TTE 1/15 EF 55%, no obvious embolic source.  Currently not on any antiplatelets given hemorrhagic conversion and now GI bleed -per GI ok to restart such agents in 5-7 days. LDL 56, A1c 6.6. Initially plans were to discharge to CIR, however upon further evaluation felt more appropriate for SNF. Neurology recommends a TEE, and ILR-spoke with Vascular surgery Dr Gilda Crease at [facility]-recommends long term anticoagulation if Afib is documented-although he thought that this was a embolic event causing left subclavian stenosis. Spoke with Dr Wynelle Cleveland to start ASA in 1 week-and to start anticoagulation ONLY if Afib is documented.  Upper GI bleed: Resolved-EGD showed gastric ulcer-No further hematemesis. Hb stable  Acute Blood loss anemia: mild ABLA-on top of anemia of  chronic disease.Hb stable  Acute respiratory failure with hypoxia: Secondary to CVA, healthcare associated pneumonia. Required intubation, subsequently extubated on 1/23- now doing well on room air.  Healthcare associated pneumonia: Afebrile, improved. Has completed a course of antibiotics. Continue to monitor off antibiotics.  Acute diastolic heart failure: Compensated.  Hypertension:  better controlled-was Uncontrolled over the past few days , spoke with Dr. Pearlean Brownie, okay to start lowering blood pressure more. Continue hydralazine and Coreg. Follow and adjust accordingly  Ischemic left upper extremity: Secondary to acute left subclavian artery thrombosis-underwent status post embolectomy on 02/27/14. Was on anticoagulation with IV heparin, which was then changed to Eliquis. Unfortunately had a large ischemic stroke with hemorrhagic conversion and now UGI bleed, subsequently off all anticoagulation at present. See above.   Dyslipidemia: LDL 56, continue statin  Type 2 diabetes: Continue with SSI, suspect may require initiation of metformin on discharge.  Disposition: Remain inpatient-SNF once TEE and ILR placed.  Antimicrobial agents  See below  Anti-infectives    Start     Dose/Rate Route Frequency Ordered Stop   03/05/15 1000  levofloxacin (LEVAQUIN) IVPB 500 mg  Status:  Discontinued     500 mg 100 mL/hr over 60 Minutes Intravenous Every 24 hours 03/04/15 0203 03/04/15 0820   03/04/15 2200  vancomycin (VANCOCIN) IVPB 1000 mg/200 mL premix  Status:  Discontinued     1,000 mg 200 mL/hr over 60 Minutes Intravenous Every 12 hours 03/04/15 0846 03/06/15 1140   03/04/15 1000  piperacillin-tazobactam (ZOSYN) IVPB 3.375 g  Status:  Discontinued     3.375 g 12.5 mL/hr over 240 Minutes Intravenous Every 8 hours 03/04/15 0846 03/06/15 1140  03/04/15 1000  vancomycin (VANCOCIN) 1,500 mg in sodium chloride 0.9 % 500 mL IVPB     1,500 mg 250 mL/hr over 120 Minutes Intravenous  Once 03/04/15  0846 03/04/15 1200      DVT Prophylaxis: SCD's  Code Status: Full code   Family Communication None at bedside  Procedures: None  CONSULTS:  cardiology, pulmonary/intensive care and neurology  Time spent 30 minutes-Greater than 50% of this time was spent in counseling, explanation of diagnosis, planning of further management, and coordination of care.  MEDICATIONS: Scheduled Meds: . carvedilol  6.25 mg Oral BID WC  . feeding supplement (ENSURE ENLIVE)  237 mL Oral BID BM  . hydrALAZINE  50 mg Oral 3 times per day  . insulin aspart  0-9 Units Subcutaneous TID WC  . ondansetron (ZOFRAN) IV  4 mg Intravenous 4 times per day  . pantoprazole (PROTONIX) IV  40 mg Intravenous Q12H  . simvastatin  40 mg Oral q1800   Continuous Infusions:  PRN Meds:.sodium chloride, hydrALAZINE    PHYSICAL EXAM: Vital signs in last 24 hours: Filed Vitals:   03/12/15 0500 03/12/15 0529 03/12/15 0755 03/12/15 0957  BP:  132/54 140/47 116/55  Pulse:  66 64 62  Temp:  97.5 F (36.4 C)  97.8 F (36.6 C)  TempSrc:  Oral  Oral  Resp:  18  15  Height:      Weight: 83.507 kg (184 lb 1.6 oz)     SpO2:  97%  99%    Weight change: -0.953 kg (-2 lb 1.6 oz) Filed Weights   03/09/15 0934 03/11/15 0500 03/12/15 0500  Weight: 86.138 kg (189 lb 14.4 oz) 84.46 kg (186 lb 3.2 oz) 83.507 kg (184 lb 1.6 oz)   Body mass index is 28 kg/(m^2).   Gen Exam: Awake and alert with clear speech.   Neck: Supple, No JVD.   Chest: B/L Clear.   CVS: S1 S2 Regular, no murmurs.  Abdomen: soft, BS +, non tender, non distended.  Extremities: no edema, lower extremities warm to touch Neurologic: Non Focal.   Skin: No Rash.   Wounds: N/A.    Intake/Output from previous day: No intake or output data in the 24 hours ending 03/12/15 1114   LAB RESULTS: CBC  Recent Labs Lab 03/06/15 0507 03/07/15 0446 03/08/15 0657 03/10/15 0401 03/10/15 0740 03/11/15 0504 03/12/15 0440  WBC 6.1 8.7 8.4  --  10.1 9.3  7.9  HGB 10.0* 10.9* 11.4* 9.9* 9.9* 8.9* 8.8*  HCT 29.9* 32.6* 34.2* 30.9* 29.8* 27.5* 27.9*  PLT 212 299 314  --  422* 396 418*  MCV 88.7 89.8 89.5  --  89.8 91.1 91.2  MCH 29.7 30.0 29.8  --  29.8 29.5 28.8  MCHC 33.4 33.4 33.3  --  33.2 32.4 31.5  RDW 13.4 13.4 13.2  --  13.4 13.7 13.6  LYMPHSABS 0.8 0.6* 1.2  --   --   --   --   MONOABS 0.3 0.6 0.6  --   --   --   --   EOSABS 0.2 0.3 0.3  --   --   --   --   BASOSABS 0.0 0.0 0.0  --   --   --   --     Chemistries   Recent Labs Lab 03/06/15 0507 03/07/15 0446 03/08/15 0657 03/09/15 0454 03/11/15 1050  NA 141 140 138 140 139  K 3.0* 3.5 3.5 4.0 4.0  CL 106 97* 103 102 104  CO2 GLUCOSE 180* 149* 122* 154* 149*  BUN 20 13 22* 44* 38*  CREATININE 0.67 0.84 0.86 1.06* 0.97  CALCIUM 8.3* 8.5* 9.0 9.2 9.0  MG 1.9 1.9 2.1  --   --     CBG:  Recent Labs Lab 03/11/15 0655 03/11/15 1118 03/11/15 1617 03/11/15 2229 03/12/15 0651  GLUCAP 126* 132* 123* 112* 115*    GFR Estimated Creatinine Clearance: 51.5 mL/min (by C-G formula based on Cr of 0.97).  Coagulation profile No results for input(s): INR, PROTIME in the last 168 hours.  Cardiac Enzymes No results for input(s): CKMB, TROPONINI, MYOGLOBIN in the last 168 hours.  Invalid input(s): CK  Invalid input(s): POCBNP No results for input(s): DDIMER in the last 72 hours.  Recent Labs  03/10/15 0900  HGBA1C 6.7*    Recent Labs  03/10/15 0900  CHOL 144  HDL 30*  LDLCALC 88  TRIG 132  CHOLHDL 4.8    Recent Labs  03/10/15 0900  TSH 2.029    Recent Labs  03/10/15 0900  VITAMINB12 472   No results for input(s): LIPASE, AMYLASE in the last 72 hours.  Urine Studies No results for input(s): UHGB, CRYS in the last 72 hours.  Invalid input(s): UACOL, UAPR, USPG, UPH, UTP, UGL, UKET, UBIL, UNIT, UROB, ULEU, UEPI, UWBC, URBC, UBAC, CAST, UCOM, BILUA  MICROBIOLOGY: Recent Results (from the past 240 hour(s))  MRSA PCR Screening      Status: None   Collection Time: 03/03/15 11:01 PM  Result Value Ref Range Status   MRSA by PCR NEGATIVE NEGATIVE Final    Comment:        The GeneXpert MRSA Assay (FDA approved for NASAL specimens only), is one component of a comprehensive MRSA colonization surveillance program. It is not intended to diagnose MRSA infection nor to guide or monitor treatment for MRSA infections.   Culture, respiratory (NON-Expectorated)     Status: None   Collection Time: 03/04/15  8:31 AM  Result Value Ref Range Status   Specimen Description TRACHEAL ASPIRATE  Final   Special Requests Normal  Final   Gram Stain   Final    ABUNDANT WBC PRESENT, PREDOMINANTLY PMN NO SQUAMOUS EPITHELIAL CELLS SEEN NO ORGANISMS SEEN Performed at [facility]    Culture   Final    FEW YEAST CONSISTENT WITH CANDIDA SPECIES Performed at [facility]    Report Status 03/06/2015 FINAL  Final    RADIOLOGY STUDIES/RESULTS: Ct Angio Head W/cm &/or Wo Cm  03/06/2015  CLINICAL DATA:  82 year old female with off balance, slurred speech, altered mental status and left facial droop. Left cerebellar infarct. Subsequent encounter. EXAM: CT ANGIOGRAPHY HEAD AND NECK TECHNIQUE: Multidetector CT imaging of the head and neck was performed using the standard protocol during bolus administration of intravenous contrast. Multiplanar CT image reconstructions and MIPs were obtained to evaluate the vascular anatomy. Carotid stenosis measurements (when applicable) are obtained utilizing NASCET criteria, using the distal internal carotid diameter as the denominator. CONTRAST:  80mL OMNIPAQUE IOHEXOL 350 MG/ML SOLN COMPARISON:  03/04/2015 and 02/26/2014 head CT. 03/03/2015 brain MR. FINDINGS: CT HEAD Brain: Large left cerebellar infarct with hemorrhage better appreciated on recently performed MR. Mass-effect upon the adjacent structures including narrowing of the fourth ventricle and inferior aspect of the aqueduct has progressed  slightly since the prior exam (series 201, image 10). Dilated lateral ventricles/ mild hydrocephalus similar to prior exam (best appreciated when compared to remote CT). Calvarium and  skull base: Negative. Paranasal sinuses: Minimal mucosal thickening maxillary sinuses greatest inferior right maxillary sinus with slight polypoid opacification. Orbits: Mild exophthalmos. CTA NECK Aortic arch: 3 vessel arch. Right carotid system: Plaque right carotid bifurcation proximal right internal carotid artery with maximal 50% diameter stenosis proximal right internal carotid artery. Left carotid system: Plaque left carotid bifurcation proximal left internal carotid artery with maximal 50% diameter stenosis proximal left internal carotid artery. Vertebral arteries:Right vertebral artery is slightly larger than left. No significant stenosis of the cervical segment of the vertebral arteries on either side. Plaque with mild narrowing right subclavian artery. Skeleton: Cervical spondylotic changes most notable C6-7. Other neck: No worrisome primary neck mass. Small to slightly moderate-size bilateral pleural effusions with adjacent passive atelectasis. Endotracheal tube and nasogastric tube in place. CTA HEAD Anterior circulation: Mild narrowing irregularity M1 segment left middle cerebral artery. Mild narrowing right middle cerebral artery bifurcation. Calcified plaque with mild narrowing cavernous segment internal carotid artery bilaterally. Posterior circulation: Narrowing of the left vertebral artery from the foramen magnum to formation of the basilar artery. Poor delineation of the left posterior inferior cerebellar artery. No significant stenosis of the distal right vertebral artery. Mild irregularity of the basilar artery without significant stenosis. Irregular anterior inferior cerebellar arteries bilaterally. Fetal type contribution to formation of the left posterior cerebral artery. No high-grade stenosis of the posterior  cerebral artery however, distal branch vessel narrowing and irregularity greater on the right. No aneurysm noted. Venous sinuses: Patent. Anatomic variants: As above. Delayed phase: As above. IMPRESSION: CT HEAD Large left cerebellar infarct with hemorrhage better appreciated on recently performed MR. Mass-effect upon the adjacent structures including narrowing of the fourth ventricle and inferior aspect of the aqueduct has progressed slightly since the prior exam (series 201, image 10). Dilated lateral ventricles/ mild hydrocephalus similar to prior exam (best appreciated when compared to remote CT). CTA NECK Plaque carotid bifurcation / proximal internal carotid artery bilaterally with maximal 50% diameter stenosis proximal internal carotid artery bilaterally. Right vertebral artery is slightly larger than left. No significant stenosis of the cervical segment of the vertebral arteries on either side. Small to slightly moderate-size bilateral pleural effusions with adjacent passive atelectasis. Endotracheal tube and nasogastric tube in place. CTA HEAD Mild narrowing irregularity M1 segment left middle cerebral artery. Mild narrowing right middle cerebral artery bifurcation. Calcified plaque with mild narrowing cavernous segment internal carotid artery bilaterally. Narrowing of the left vertebral artery from the foramen magnum to formation of the basilar artery. Poor delineation of the left posterior inferior cerebellar artery and appears occluded. No significant stenosis of the distal right vertebral artery. Mild irregularity of the basilar artery without significant stenosis. Irregular anterior inferior cerebellar arteries bilaterally. Fetal type contribution to formation of the left posterior cerebral artery. No high-grade stenosis of the posterior cerebral artery however, distal branch vessel narrowing and irregularity greater on the right. Electronically Signed   By: Lacy Duverney M.D.   On: 03/06/2015 08:04    Dg Chest 1 View  03/03/2015  CLINICAL DATA:  Acute onset of hypoxia. Cough and shortness of breath. Insert macro EXAM: CHEST 1 VIEW COMPARISON:  Chest radiograph performed 02/27/2015 FINDINGS: The lungs are well-aerated. Patchy right basilar airspace opacity raises concern for pneumonia. The appearance is less typical for asymmetric pulmonary edema. Underlying vascular congestion is noted. Mild left perihilar opacity is seen. No pleural effusion or pneumothorax is identified. The cardiomediastinal silhouette is mildly enlarged. No acute osseous abnormalities are seen. IMPRESSION: 1. Patchy right basilar airspace  opacity raises concern for pneumonia. The appearance is less typical for asymmetric pulmonary edema. Mild left perihilar opacity seen. 2. Underlying vascular congestion and mild cardiomegaly. Electronically Signed   By: Roanna Raider M.D.   On: 03/03/2015 01:06   Dg Chest 2 View  02/27/2015  CLINICAL DATA:  Acute onset left arm pain at 4:30 p.m. today. Initial encounter. EXAM: CHEST  2 VIEW COMPARISON:  None. FINDINGS: Calcified granuloma on the right is noted. The lungs are otherwise clear. Heart size is normal. No pneumothorax or pleural effusion. Thoracic spondylosis is identified. IMPRESSION: No acute abnormality. Electronically Signed   By: Drusilla Kanner M.D.   On: 02/27/2015 20:18   Dg Abd 1 View  03/03/2015  CLINICAL DATA:  Status post enteric catheter placement. EXAM: ABDOMEN - 1 VIEW COMPARISON:  None. FINDINGS: Enteric catheter seen with tip overlying gastric body. The bowel gas pattern is normal. No radiographic evidence of organomegaly. No radio-opaque calculi or other significant radiographic abnormality are seen. IMPRESSION: Enteric catheter with tip overlying the gastric body. Electronically Signed   By: Ted Mcalpine M.D.   On: 03/03/2015 19:05   Ct Head Wo Contrast  03/10/2015  CLINICAL DATA:  Recent stroke with left-sided weakness and slurred speech. New frontal  headache. EXAM: CT HEAD WITHOUT CONTRAST TECHNIQUE: Contiguous axial images were obtained from the base of the skull through the vertex without intravenous contrast. COMPARISON:  03/06/2015 and previous FINDINGS: Go low-density persists in the inferior cerebellum on the left, but swelling is subsidence slightly. Less mass effect upon the fourth ventricle. No evidence of developing hemorrhage. No new insult. Cerebral hemispheres are normal. Ventricular size is stable. No extra-axial collection. Sinuses, middle ears and mastoids are clear. IMPRESSION: Expected evolutionary changes of the inferior left cerebellar infarction. Less swelling and mass effect than on the previous studies. No evidence of complication such as hemorrhage. No new infarction. Electronically Signed   By: Paulina Fusi M.D.   On: 03/10/2015 13:12   Ct Head Wo Contrast  03/04/2015  CLINICAL DATA:  Known left cerebellar infarct, evaluate for hydrocephalus EXAM: CT HEAD WITHOUT CONTRAST TECHNIQUE: Contiguous axial images were obtained from the base of the skull through the vertex without intravenous contrast. COMPARISON:  03/03/2015 FINDINGS: Bony calvarium is intact. There are again noted changes consistent with a left cerebellar infarct with mass-effect upon the fourth ventricle and pons. The lateral ventricles arm more prominent than that seen on a prior exam from 02/26/2014 but relatively stable from the recent MRI examination from the previous day. No acute hemorrhage is identified. No other focal abnormality is seen. IMPRESSION: Left cerebellar infarct with evidence of mild ventricular dilatation stable from the previous day. Electronically Signed   By: Alcide Clever M.D.   On: 03/04/2015 09:07   Ct Angio Neck W/cm &/or Wo/cm  03/06/2015  CLINICAL DATA:  82 year old female with off balance, slurred speech, altered mental status and left facial droop. Left cerebellar infarct. Subsequent encounter. EXAM: CT ANGIOGRAPHY HEAD AND NECK  TECHNIQUE: Multidetector CT imaging of the head and neck was performed using the standard protocol during bolus administration of intravenous contrast. Multiplanar CT image reconstructions and MIPs were obtained to evaluate the vascular anatomy. Carotid stenosis measurements (when applicable) are obtained utilizing NASCET criteria, using the distal internal carotid diameter as the denominator. CONTRAST:  80mL OMNIPAQUE IOHEXOL 350 MG/ML SOLN COMPARISON:  03/04/2015 and 02/26/2014 head CT. 03/03/2015 brain MR. FINDINGS: CT HEAD Brain: Large left cerebellar infarct with hemorrhage better appreciated on recently performed  MR. Mass-effect upon the adjacent structures including narrowing of the fourth ventricle and inferior aspect of the aqueduct has progressed slightly since the prior exam (series 201, image 10). Dilated lateral ventricles/ mild hydrocephalus similar to prior exam (best appreciated when compared to remote CT). Calvarium and skull base: Negative. Paranasal sinuses: Minimal mucosal thickening maxillary sinuses greatest inferior right maxillary sinus with slight polypoid opacification. Orbits: Mild exophthalmos. CTA NECK Aortic arch: 3 vessel arch. Right carotid system: Plaque right carotid bifurcation proximal right internal carotid artery with maximal 50% diameter stenosis proximal right internal carotid artery. Left carotid system: Plaque left carotid bifurcation proximal left internal carotid artery with maximal 50% diameter stenosis proximal left internal carotid artery. Vertebral arteries:Right vertebral artery is slightly larger than left. No significant stenosis of the cervical segment of the vertebral arteries on either side. Plaque with mild narrowing right subclavian artery. Skeleton: Cervical spondylotic changes most notable C6-7. Other neck: No worrisome primary neck mass. Small to slightly moderate-size bilateral pleural effusions with adjacent passive atelectasis. Endotracheal tube and  nasogastric tube in place. CTA HEAD Anterior circulation: Mild narrowing irregularity M1 segment left middle cerebral artery. Mild narrowing right middle cerebral artery bifurcation. Calcified plaque with mild narrowing cavernous segment internal carotid artery bilaterally. Posterior circulation: Narrowing of the left vertebral artery from the foramen magnum to formation of the basilar artery. Poor delineation of the left posterior inferior cerebellar artery. No significant stenosis of the distal right vertebral artery. Mild irregularity of the basilar artery without significant stenosis. Irregular anterior inferior cerebellar arteries bilaterally. Fetal type contribution to formation of the left posterior cerebral artery. No high-grade stenosis of the posterior cerebral artery however, distal branch vessel narrowing and irregularity greater on the right. No aneurysm noted. Venous sinuses: Patent. Anatomic variants: As above. Delayed phase: As above. IMPRESSION: CT HEAD Large left cerebellar infarct with hemorrhage better appreciated on recently performed MR. Mass-effect upon the adjacent structures including narrowing of the fourth ventricle and inferior aspect of the aqueduct has progressed slightly since the prior exam (series 201, image 10). Dilated lateral ventricles/ mild hydrocephalus similar to prior exam (best appreciated when compared to remote CT). CTA NECK Plaque carotid bifurcation / proximal internal carotid artery bilaterally with maximal 50% diameter stenosis proximal internal carotid artery bilaterally. Right vertebral artery is slightly larger than left. No significant stenosis of the cervical segment of the vertebral arteries on either side. Small to slightly moderate-size bilateral pleural effusions with adjacent passive atelectasis. Endotracheal tube and nasogastric tube in place. CTA HEAD Mild narrowing irregularity M1 segment left middle cerebral artery. Mild narrowing right middle cerebral  artery bifurcation. Calcified plaque with mild narrowing cavernous segment internal carotid artery bilaterally. Narrowing of the left vertebral artery from the foramen magnum to formation of the basilar artery. Poor delineation of the left posterior inferior cerebellar artery and appears occluded. No significant stenosis of the distal right vertebral artery. Mild irregularity of the basilar artery without significant stenosis. Irregular anterior inferior cerebellar arteries bilaterally. Fetal type contribution to formation of the left posterior cerebral artery. No high-grade stenosis of the posterior cerebral artery however, distal branch vessel narrowing and irregularity greater on the right. Electronically Signed   By: Lacy Duverney M.D.   On: 03/06/2015 08:04   Ct Angio Up Extrem Left W/cm &/or Wo/cm  02/27/2015  CLINICAL DATA:  82 year old female with left arm pain and heaviness. EXAM: CT ANGIOGRAPHY UPPER LEFT EXTREMITY TECHNIQUE: Transaxial CT of the left upper extremity from the level of the clavicle technique  timed was performed in 3 mm slice thickness. Coronal and sagittal reformatted images were provided. The study was performed following intravenous administration of 75 cc Omnipaque 350 CONTRAST:  75mL OMNIPAQUE IOHEXOL 350 MG/ML SOLN COMPARISON:  Go technique FINDINGS: Evaluation of the distal extremity vasculature is limited due to streak artifact caused by patient's body. The aortic arch and the origins of the great vessels appear unremarkable. There is segmental wall thickening with luminal narrowing of the left subclavian artery extending into the left axillary artery and proximal brachial artery. There is high-grade narrowing of the subclavian and axillary segment of the vessel. The distal brachial artery is patent. There is bifurcation of the brachial artery approximately 4.5 cm distal to the elbow. The proximal portion of the radial and ulnar artery appear patent. Evaluation of the distal  portion of this vessel is very limited due to suboptimal opacification. There is however apparent diminished flow within the distal radial and ulna are artery concerning for underlying vascular disease. Correlation with clinical exam and duplex ultrasound recommended. The soft tissues of the left upper extremity appear unremarkable. There is no left axillary adenopathy. Linear left lung base atelectatic changes/scarring noted. A 13 mm indeterminate left adrenal nodule. Multiple left renal hypodense lesions, incompletely characterized, possibly parenchymal and parapelvic cysts. Ultrasound may provide better evaluation. There is diverticulosis of the visualized colon. No acute fracture. Review of the MIP images confirms the above findings. IMPRESSION: Segmental narrowing of the left subclavian, axillary, and proximal brachial arteries. Limited evaluation of the mid and distal portion of the radial and ulna arteries due to suboptimal opacification concerning for diminished flow related to underlying peripheral vascular disease. Correlation with clinical exam and further evaluation with duplex ultrasound recommended. Electronically Signed   By: Elgie Collard M.D.   On: 02/27/2015 23:55   Mr Laqueta Jean ZO Contrast  03/03/2015  CLINICAL DATA:  82 year old hypertensive female with altered mental status and facial weakness. Subsequent encounter. EXAM: MRI HEAD WITHOUT AND WITH CONTRAST TECHNIQUE: Multiplanar, multiecho pulse sequences of the brain and surrounding structures were obtained without and with intravenous contrast. CONTRAST:  18mL MULTIHANCE GADOBENATE DIMEGLUMINE 529 MG/ML IV SOLN COMPARISON:  02/26/2014. 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 Interval slight increase in size of ventricles compared to recent CT consistent with developing hydrocephalus. Patient is un safe for lumbar puncture. Mild small vessel  disease changes. No intracranial mass separate from above described findings. Fast technique imaging had to be utilized. Left vertebral artery is at least partially patent. Right vertebral artery, basilar artery and internal carotid arteries are patent. Limited assessment of the left posterior inferior cerebellar artery. 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 Interval slight increase in size of ventricles compared to recent CT consistent with developing hydrocephalus. Left vertebral artery is at least partially patent. Limited assessment of the left posterior inferior cerebellar artery. These results will be called to the ordering clinician or representative by the Radiologist Assistant, and communication documented in the PACS or zVision Dashboard. Electronically Signed   By: Lacy Duverney M.D.   On: 03/03/2015 16:55   Dg Chest Port 1 View  03/09/2015  CLINICAL DATA:  Shortness of breath. EXAM: PORTABLE CHEST 1 VIEW COMPARISON:  03/07/2015. FINDINGS: Mediastinum hilar structures normal. Cardiomegaly with normal pulmonary vascularity. Interval near complete clearing of bilateral pulmonary infiltrates/edema. Small right pleural effusion cannot be excluded. Left costophrenic  angle not imaged. No pneumothorax. IMPRESSION: Stable cardiomegaly with interim near complete resolution of bilateral pulmonary edema and/or infiltrates . Electronically Signed   By: Maisie Fus  Register   On: 03/09/2015 07:33   Dg Chest Port 1 View  03/07/2015  CLINICAL DATA:  Pulmonary edema. EXAM: PORTABLE CHEST 1 VIEW COMPARISON:  03/06/2015. FINDINGS: Interim extubation and removal of NG tube. Cardiomegaly with persistent bilateral pulmonary infiltrates suggesting pulmonary edema. Slight interim clearing. Persistent low lung volumes with basilar atelectasis. No prominent pleural effusion or pneumothorax. IMPRESSION: 1. Interim removal of  endotracheal tube and NG tube. 2. Cardiomegaly with persistent bilateral pulmonary infiltrates consistent with pulmonary edema. Slight improvement from prior exam. Persistent low lung volumes with mild basilar atelectasis. Electronically Signed   By: Maisie Fus  Register   On: 03/07/2015 07:24   Dg Chest Port 1 View  03/06/2015  CLINICAL DATA:  Pneumonia. EXAM: PORTABLE CHEST 1 VIEW COMPARISON:  03/05/2015. FINDINGS: Endotracheal tube and NG tube in stable position. Cardiomegaly with diffuse bilateral pulmonary infiltrates again noted. Findings consistent with congestive heart failure. No change from prior exam. No prominent pleural effusion or pneumothorax . Degenerative changes thoracic spine. IMPRESSION: 1. Lines and tubes in stable position. 2. Congestive heart failure with bilateral pulmonary edema, no change from prior exam. Bilateral pneumonia cannot be excluded. Electronically Signed   By: Maisie Fus  Register   On: 03/06/2015 07:29   Dg Chest Port 1 View  03/05/2015  CLINICAL DATA:  Intubation. EXAM: PORTABLE CHEST 1 VIEW COMPARISON:  03/03/2015. FINDINGS: Endotracheal tube and NG tube in stable position. Cardiomegaly with diffuse bilateral pulmonary alveolar infiltrates, improved from prior exam. No pleural effusion or pneumothorax. IMPRESSION: 1. Lines and tubes in stable position. 2. Cardiomegaly with bilateral pulmonary infiltrates consistent with pulmonary edema. Interim improvement from prior exam. Electronically Signed   By: Maisie Fus  Register   On: 03/05/2015 07:14   Portable Chest Xray  03/03/2015  CLINICAL DATA:  Admission chest radiograph. Known hemorrhagic infarct. Initial encounter. EXAM: PORTABLE CHEST 1 VIEW COMPARISON:  Chest radiograph performed earlier today at 6:39 p.m. FINDINGS: The patient's endotracheal tube is seen ending 2-3 cm above the carina. An enteric tube is noted extending below the diaphragm. Bilateral central airspace opacity raises concern for pulmonary edema. No pleural  effusion or pneumothorax is seen. The cardiomediastinal silhouette is mildly enlarged. No acute osseous abnormalities are identified. IMPRESSION: 1. Endotracheal tube seen ending 2-3 cm above the carina. 2. Bilateral central airspace opacity is perhaps slightly worsened and raises concern for pulmonary edema. Mild cardiomegaly. Electronically Signed   By: Roanna Raider M.D.   On: 03/03/2015 23:00   Dg Chest Port 1 View  03/03/2015  CLINICAL DATA:  82 year old female being transferred to [facility] , intubated and sedated now. Cough and shortness of breath. Initial encounter. EXAM: PORTABLE CHEST 1 VIEW COMPARISON:  [facility] 0022 hours today. FINDINGS: Portable AP semi upright view at 1839 hours. Endotracheal tube tip in good position between the level the clavicles and carina. Enteric tube courses to the left upper quadrant, tip not included. Interval worsening confluence of right lung base opacity, favor lower lobe. Upper lobes appear stable. Left lung base is stable and probably unaffected. No superimposed pneumothorax or pulmonary edema. No pleural effusion identified. IMPRESSION: 1. Endotracheal tube tip in good position. Enteric tube courses to the abdomen. 2. Radiographic progression of right lung base pneumonia and/or aspiration since earlier today. No pleural effusion identified. Electronically Signed   By:  Odessa Fleming M.D.   On: 03/03/2015 19:04    Jeoffrey Massed, MD  [facility] Pager:[phone]  If 7PM-7AM, please contact night-coverage [email or web address] Password [email or web address] 03/12/2015, 11:14 AM   LOS: 9 days

## 2015-03-12 NOTE — CV Procedure (Signed)
    TRANSESOPHAGEAL ECHOCARDIOGRAM (TEE) NOTE  INDICATIONS: stroke  PROCEDURE:   Informed consent was obtained prior to the procedure. The risks, benefits and alternatives for the procedure were discussed and the patient comprehended these risks.  Risks include, but are not limited to, cough, sore throat, vomiting, nausea, somnolence, esophageal and stomach trauma or perforation, bleeding, low blood pressure, aspiration, pneumonia, infection, trauma to the teeth and death.    After a procedural time-out, the patient was given 4 mg versed and 50 mcg fentanyl for moderate sedation.  The oropharynx was anesthetized with 2 cetacaine sprays.  At this point, it was determined that the patient was not at all sedated. 2 days ago, she had an EGD and only required 2 mg versed and 25 mcg fentanyl. We identified her IV had infiltrated. Several attempts at placing a new IV were unsuccessful. Her left arm was not useable due to prior breast CA and radical lymph node dissection. A foot stick was authorized by me and was also unsuccessful.    COMPLICATIONS:    There were no immediate complications. IV access was not able to be obtained, therefore, the procedure was aborted.  IMPRESSION:   1.  Loss of IV access and inability to adequately sedate the patient for TEE, therefore the procedure was cancelled and will be rescheduled when adequate IV access is obtained.  Time Spent Directly with the Patient:  30 minutes of direct patient care, preparation for TEE, multiple IV attempts  Chrystie Nose, MD, [facility] Attending Cardiologist [facility]  03/12/2015, 3:27 PM

## 2015-03-12 NOTE — Progress Notes (Signed)
STROKE TEAM PROGRESS NOTE   SUBJECTIVE (INTERVAL HISTORY) Family member at bedside. Awaiting cardiology opinion on TEE.\ loop. No more GIB.    OBJECTIVE Temp:  [97.5 F (36.4 C)-98.3 F (36.8 C)] 97.8 F (36.6 C) (01/30 0957) Pulse Rate:  [62-73] 62 (01/30 0957) Cardiac Rhythm:  [-]  Resp:  [15-18] 15 (01/30 0957) BP: (116-151)/(47-62) 116/55 mmHg (01/30 0957) SpO2:  [96 %-100 %] 99 % (01/30 0957) Weight:  [83.507 kg (184 lb 1.6 oz)] 83.507 kg (184 lb 1.6 oz) (01/30 0500)  CBC:   Recent Labs Lab 03/07/15 0446 03/08/15 0657  03/11/15 0504 03/12/15 0440  WBC 8.7 8.4  < > 9.3 7.9  NEUTROABS 7.2 6.3  --   --   --   HGB 10.9* 11.4*  < > 8.9* 8.8*  HCT 32.6* 34.2*  < > 27.5* 27.9*  MCV 89.8 89.5  < > 91.1 91.2  PLT 299 314  < > 396 418*  < > = values in this interval not displayed.  Basic Metabolic Panel:   Recent Labs Lab 03/07/15 0446 03/08/15 0657 03/09/15 0454 03/11/15 1050  NA 140 138 140 139  K 3.5 3.5 4.0 4.0  CL 97* 103 102 104  CO2 GLUCOSE 149* 122* 154* 149*  BUN 13 22* 44* 38*  CREATININE 0.84 0.86 1.06* 0.97  CALCIUM 8.5* 9.0 9.2 9.0  MG 1.9 2.1  --   --   PHOS 4.0 3.7  --   --     Lipid Panel:     Component Value Date/Time   CHOL 144 03/10/2015 0900   TRIG 128 03/10/2015 0900   HDL 30* 03/10/2015 0900   CHOLHDL 4.8 03/10/2015 0900   VLDL 26 03/10/2015 0900   LDLCALC 88 03/10/2015 0900   HgbA1c:  Lab Results  Component Value Date   HGBA1C 6.7* 03/10/2015   Urine Drug Screen: No results found for: LABOPIA, COCAINSCRNUR, LABBENZ, AMPHETMU, THCU, LABBARB    IMAGING I have personally reviewed the radiological images below and agree with the radiology interpretations.  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 Interval slight increase in size of ventricles compared to recent CT consistent with developing  hydrocephalus. Left vertebral artery is at least partially patent. Limited assessment of the left posterior inferior cerebellar artery.   CT HEAD 03/10/2015 Expected evolutionary changes of the inferior left cerebellar infarction. Less swelling and mass effect than on the previous studies. No evidence of complication such as hemorrhage. No new infarction. 03/06/2015 Large left cerebellar infarct with hemorrhage better appreciated on recently performed MR. Mass-effect upon the adjacent structures including narrowing of the fourth ventricle and inferior aspect of the aqueduct has progressed slightly since the prior exam  (series 201, image 10). Dilated lateral ventricles/ mild hydrocephalus similar to prior exam (best appreciated when compared to remote CT).  CTA NECK 03/06/2015 Plaque carotid bifurcation / proximal internal carotid artery bilaterally with maximal 50% diameter stenosis proximal internal carotid artery bilaterally. Right vertebral artery is slightly larger than left. No significant stenosis of the cervical segment of the vertebral arteries on either side. Small to slightly moderate-size bilateral pleural effusions with adjacent passive atelectasis. Endotracheal tube and nasogastric tube in place.  CTA HEAD 03/06/2015 Mild narrowing irregularity M1 segment left middle cerebral artery. Mild narrowing right middle cerebral artery bifurcation. Calcified plaque with mild narrowing cavernous segment internal carotid artery bilaterally.  Narrowing of the left vertebral artery from the foramen magnum to formation of the basilar artery. Poor delineation of the left posterior inferior cerebellar artery and appears occluded. No significant stenosis of the distal right vertebral artery. Mild irregularity of the basilar artery without significant stenosis. Irregular anterior inferior cerebellar arteries bilaterally. Fetal type contribution to formation of the left posterior cerebral artery. No  high-grade stenosis of the posterior cerebral artery however, distal branch vessel narrowing and irregularity greater on the right.  Upper endoscopy 03/10/2015  1. Single ulcer in the gastric body 2. Erosive gastritis in the gastric body 3. The EGD otherwise appeared normal  TTE  - Left ventricle: The cavity size was normal. Systolic function was normal. The estimated ejection fraction was in the range of 55%to 60%. Wall motion was normal; there were no regional wallmotion abnormalities. Features are consistent with a pseudonormalleft ventricular filling pattern, with concomitant abnormalrelaxation and increased filling pressure (grade 2 diastolicdysfunction). - Mitral valve: Mildly calcified annulus. There was mildregurgitation. - Left atrium: The atrium was mildly dilated.  TEE - not able to perform due to loss of IV access.    PHYSICAL EXAM Frail elderly lady   . Afebrile. Head is nontraumatic. Neck is supple without bruit. Cardiac exam no murmur or gallop. Lungs are clear to auscultation. Distal pulses are well felt. Neurological Exam :  Awake and alert, mild lethargy, orientated to time, place and people. PERRL. fundi not visualized. Vision acuity and fields seem adequate. Extraocular movements are full range without nystagmus. Mild saccadic dysmetria on left gaze Face symmetric. Opens eyes to light stimulation.   Can follow simple commands, nods to questions. No gaze deviation. Does track. Conjugate gaze.No nystagmus. moves all extremities equally. Tongue midline. Cough and gag intact. No extremity drift or focal weakness. Mild left finger-to-nose dysmetria. Sensation appears intact bilaterally. Toes equivocal.  Gait deferred   ASSESSMENT/PLAN [name] is a 82 y.o. 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 She did not receive IV t-PA due to hemorrhage.   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 Unlikely due to L VA embolism from subclavian thrombosis as it is distal to [place] take off  Resultant  Mild left UE dysmetria  MRI  - Large left cerebellar infarct with small hemorrhagic transformation.  Repeat Head CT - 03/10/2015 - less swelling. Expected evolution of stroke.   CTA head and neck - left PICA occlusion  2D Echo - EF 55-60%. No cardiac source of emboli identified.  TEE not able to do due to loss of IV access.  Given the difficulty with TEE and clear embolic events x 2, we recommend to initiate anticoagulation with either coumadin or NOACs at day 7 post GIB allowed by GI services.   LDL - 88  HgbA1c - 6.7  VTE prophylaxis - SCDs  Diet - low sodium heart healthy Diet Carb Modified Diet NPO time specified  aspirin 81 mg daily prior to admission, now on No antithrombotic secondary to hemorrhage.  Ongoing aggressive stroke risk factor management  Therapy recommendations: Skilled nursing facility placement recommended - not approved for CIR.  Disposition:  Pending  Left subclavian artery thrombosis, suspicious for afib  S/p thrombectomy  Was on eliquis, stopped due to hemorrhagic transformation  May consider to resume in one week as per GI  GIB  Bloody vomitus  EGD done showed single gastric ulcer and gastritis  On protonix injection  Hold off anticoagulation or antiplatelet in 5-7 days.   CBC monitoring  Hypertension  Stable  Hyperlipidemia  Zocor 20 mg daily prior to admission - resumed  LDL 88 - Goal < 70  Increase Zocor to 40 mg daily  Continue statin at time of discharge  Other Stroke Risk Factors  Advanced age  Other Active Problems  Anemia - appears stable  Renal insufficiency - check again on Monday - recently started on lisinopril  Neurology will sign off. Please call  with questions. Pt will follow up with Dr. Roda Shutters at [facility] in about 2 months. Thanks for the consult.  Marvel Plan, MD PhD Stroke Neurology 03/12/2015 10:15 PM    To contact Stroke Continuity provider, please refer to [email or web address]. After hours, contact General Neurology

## 2015-03-12 NOTE — Progress Notes (Signed)
SLP Cancellation Note  Patient Details Name: Nancy Hahn MRN: [id number] DOB: 14-Feb-1931   Cancelled treatment:       Reason Eval/Treat Not Completed: Patient at procedure or test/unavailable  [name] MA, CCC-SLP [phone]  [name] Meryl 03/12/2015, 3:35 PM

## 2015-03-12 NOTE — Clinical Social Work Note (Addendum)
Clinical Social Worker informed by MD, patient is not medically stable for discharge today. Patient has a bed at [facility]. Facility notified.   [employer] authorization will need to be re-initiated, auth: [id number] expires on 1/30.  CSW remains available as needed.   Derenda Fennel, MSW, LCSWA [phone] 03/12/2015 3:29 PM

## 2015-03-13 ENCOUNTER — Encounter (HOSPITAL_COMMUNITY): Payer: Self-pay | Admitting: Internal Medicine

## 2015-03-13 DIAGNOSIS — J9601 Acute respiratory failure with hypoxia: Secondary | ICD-10-CM | POA: Diagnosis not present

## 2015-03-13 DIAGNOSIS — Z9181 History of falling: Secondary | ICD-10-CM | POA: Diagnosis not present

## 2015-03-13 DIAGNOSIS — D62 Acute posthemorrhagic anemia: Secondary | ICD-10-CM | POA: Diagnosis not present

## 2015-03-13 DIAGNOSIS — E785 Hyperlipidemia, unspecified: Secondary | ICD-10-CM | POA: Diagnosis not present

## 2015-03-13 DIAGNOSIS — K922 Gastrointestinal hemorrhage, unspecified: Secondary | ICD-10-CM | POA: Diagnosis not present

## 2015-03-13 DIAGNOSIS — I69192 Facial weakness following nontraumatic intracerebral hemorrhage: Secondary | ICD-10-CM | POA: Diagnosis not present

## 2015-03-13 DIAGNOSIS — I639 Cerebral infarction, unspecified: Secondary | ICD-10-CM | POA: Diagnosis not present

## 2015-03-13 DIAGNOSIS — I509 Heart failure, unspecified: Secondary | ICD-10-CM | POA: Diagnosis not present

## 2015-03-13 DIAGNOSIS — I748 Embolism and thrombosis of other arteries: Secondary | ICD-10-CM | POA: Diagnosis not present

## 2015-03-13 DIAGNOSIS — I69198 Other sequelae of nontraumatic intracerebral hemorrhage: Secondary | ICD-10-CM | POA: Diagnosis not present

## 2015-03-13 DIAGNOSIS — K254 Chronic or unspecified gastric ulcer with hemorrhage: Secondary | ICD-10-CM | POA: Diagnosis not present

## 2015-03-13 DIAGNOSIS — R4781 Slurred speech: Secondary | ICD-10-CM | POA: Diagnosis not present

## 2015-03-13 DIAGNOSIS — K259 Gastric ulcer, unspecified as acute or chronic, without hemorrhage or perforation: Secondary | ICD-10-CM | POA: Diagnosis not present

## 2015-03-13 DIAGNOSIS — I6789 Other cerebrovascular disease: Secondary | ICD-10-CM | POA: Diagnosis not present

## 2015-03-13 DIAGNOSIS — M6281 Muscle weakness (generalized): Secondary | ICD-10-CM | POA: Diagnosis not present

## 2015-03-13 DIAGNOSIS — I739 Peripheral vascular disease, unspecified: Secondary | ICD-10-CM | POA: Diagnosis not present

## 2015-03-13 DIAGNOSIS — I69128 Other speech and language deficits following nontraumatic intracerebral hemorrhage: Secondary | ICD-10-CM | POA: Diagnosis not present

## 2015-03-13 DIAGNOSIS — I6350 Cerebral infarction due to unspecified occlusion or stenosis of unspecified cerebral artery: Secondary | ICD-10-CM | POA: Diagnosis not present

## 2015-03-13 DIAGNOSIS — D500 Iron deficiency anemia secondary to blood loss (chronic): Secondary | ICD-10-CM | POA: Diagnosis not present

## 2015-03-13 DIAGNOSIS — I4891 Unspecified atrial fibrillation: Secondary | ICD-10-CM | POA: Diagnosis not present

## 2015-03-13 DIAGNOSIS — E119 Type 2 diabetes mellitus without complications: Secondary | ICD-10-CM | POA: Diagnosis not present

## 2015-03-13 DIAGNOSIS — J189 Pneumonia, unspecified organism: Secondary | ICD-10-CM | POA: Diagnosis not present

## 2015-03-13 DIAGNOSIS — I10 Essential (primary) hypertension: Secondary | ICD-10-CM | POA: Diagnosis not present

## 2015-03-13 LAB — GLUCOSE, CAPILLARY
GLUCOSE-CAPILLARY: 160 mg/dL — AB (ref 65–99)
GLUCOSE-CAPILLARY: 101 mg/dL — AB (ref 65–99)
Glucose-Capillary: 109 mg/dL — ABNORMAL HIGH (ref 65–99)

## 2015-03-13 LAB — H. PYLORI ANTIBODY, IGG: H PYLORI IGG: 1.2 U/mL — AB (ref 0.0–0.8)

## 2015-03-13 MED ORDER — METRONIDAZOLE 250 MG PO TABS: 250.0000 mg | ORAL_TABLET | Freq: Four times a day (QID) | ORAL | Status: DC

## 2015-03-13 MED ORDER — PANTOPRAZOLE SODIUM 40 MG PO TBEC
40.0000 mg | DELAYED_RELEASE_TABLET | Freq: Two times a day (BID) | ORAL | Status: DC
  Administered 2015-03-13: 40 mg via ORAL

## 2015-03-13 MED ORDER — TETRACYCLINE HCL 500 MG PO CAPS: 500.0000 mg | ORAL_CAPSULE | Freq: Four times a day (QID) | ORAL | Status: DC

## 2015-03-13 MED ORDER — BISMUTH SUBSALICYLATE 262 MG/15ML PO SUSP: 30.0000 mL | Freq: Four times a day (QID) | ORAL | Status: DC

## 2015-03-13 MED ORDER — CARVEDILOL 12.5 MG PO TABS: 12.5000 mg | ORAL_TABLET | Freq: Two times a day (BID) | ORAL | Status: DC

## 2015-03-13 MED ORDER — PANTOPRAZOLE SODIUM 40 MG PO TBEC: 40.0000 mg | DELAYED_RELEASE_TABLET | Freq: Two times a day (BID) | ORAL | Status: AC

## 2015-03-13 NOTE — Clinical Social Work Placement (Signed)
   CLINICAL SOCIAL WORK PLACEMENT  NOTE  Date:  03/13/2015  Patient Details  Name: Nancy Hahn MRN: [id number] Date of Birth: 01-04-1932  Clinical Social Work is seeking post-discharge placement for this patient at the Skilled  Nursing Facility level of care (*CSW will initial, date and re-position this form in  chart as items are completed):  Yes   Patient/family provided with [facility] [place] Clinical Social Work Department's list of facilities offering this level of care within the geographic area requested by the patient (or if unable, by the patient's family).  Yes   Patient/family informed of their freedom to choose among providers that offer the needed level of care, that participate in Medicare, Medicaid or managed care program needed by the patient, have an available bed and are willing to accept the patient.  Yes   Patient/family informed of 's ownership interest in [facility] and [facility], as well as of the fact that they are under no obligation to receive care at these facilities.  PASRR submitted to EDS on       PASRR number received on       Existing PASRR number confirmed on 03/09/15     FL2 transmitted to all facilities in geographic area requested by pt/family on 03/09/15     FL2 transmitted to all facilities within larger geographic area on       Patient informed that his/her managed care company has contracts with or will negotiate with certain facilities, including the following:        Yes   Patient/family informed of bed offers received.  Patient chooses bed at  [facility] )     Physician recommends and patient chooses bed at      Patient to be transferred to  ([facility] ) on 03/13/15.  Patient to be transferred to facility by  [facility])     Patient family notified on 03/13/15 of transfer.  Name of family member notified:   (Pt's sister, [name] )     PHYSICIAN Please prepare priority discharge summary, including  medications     Additional Comment:    _______________________________________________ Vaughan Browner, LCSW 03/13/2015, 10:30 AM

## 2015-03-13 NOTE — Progress Notes (Signed)
Physical Therapy Treatment Patient Details Name: Nancy Hahn MRN: [id number] DOB: [birth year] Today's Date: 03/13/2015    History of Present Illness pt presents with L Cerebellar Hemorrhage with mass effect on 4th ventricle.  pt with recent L Subclavian Artery thrombectomy on 02/28/15 and was put on blood thinners.  pt with hx of HTN and recent PNA.      PT Comments    Patient continues to have vertigo and imbalance with mobility, however is improved and did not progress to nausea today. Able to utilize RW and visual compensations to ambulate in hall x 35 ft with min assist.   Follow Up Recommendations  SNF (CIR has denied)     Equipment Recommendations  None recommended by PT    Recommendations for Other Services       Precautions / Restrictions Precautions Precautions: Fall Restrictions Weight Bearing Restrictions: No    Mobility  Bed Mobility Overal bed mobility: Needs Assistance Bed Mobility: Rolling;Sidelying to Sit Rolling: Min guard Sidelying to sit: Min assist       General bed mobility comments: educated on fixing vision on a single target to decr vertigo and nausea; utilized "A" on white paper and moved target with patient as she moved. pt with difficulty keeping eyes on the target and frequently looking around. Denied nausea upon sitting; mild vertigo  Transfers Overall transfer level: Needs assistance Equipment used: Rolling walker (2 wheeled) Transfers: Sit to/from Stand Sit to Stand: Min assist         General transfer comment: slight ataxia of trunk as coming to stand; once hands on RW, pt was able to control torso  Ambulation/Gait Ambulation/Gait assistance: Min assist;+2 safety/equipment Ambulation Distance (Feet): 35 Feet (seated rest, 35) Assistive device: Rolling walker (2 wheeled) (followed with chair) Gait Pattern/deviations: Step-through pattern;Decreased stride length;Drifts right/left Gait velocity: decreased Gait velocity  interpretation: Below normal speed for age/gender General Gait Details: pt maintaining gaze towards floor (or cross bar of RW) to minimize vertigo (w;hich did increase with ambulation and she became more unsteady toward end of each walk   Stairs            Wheelchair Mobility    Modified Rankin (Stroke Patients Only) Modified Rankin (Stroke Patients Only) Pre-Morbid Rankin Score: No symptoms Modified Rankin: Severe disability     Balance Overall balance assessment: Needs assistance Sitting-balance support: No upper extremity supported;Feet supported Sitting balance-Leahy Scale: Fair     Standing balance support: Bilateral upper extremity supported Standing balance-Leahy Scale: Poor                      Cognition Arousal/Alertness: Awake/alert Behavior During Therapy: Flat affect Overall Cognitive Status: No family/caregiver present to determine baseline cognitive functioning Area of Impairment: Orientation;Memory;Awareness Orientation Level: Situation (time NT)   Memory: Decreased short-term memory   Safety/Judgement: Decreased awareness of deficits Awareness:  (pre-intellectual) Problem Solving: Requires verbal cues General Comments: Did not recall she had a stroke and was surprised when I told her.    Exercises      General Comments General comments (skin integrity, edema, etc.): EDucated on use of visual compensations to decr vertigo. Denied nausea throughout session      Pertinent Vitals/Pain Pain Assessment: No/denies pain    Home Living                      Prior Function            PT Goals (current goals can  now be found in the care plan section) Acute Rehab PT Goals Patient Stated Goal: To go home Time For Goal Achievement: 03/21/15 Progress towards PT goals: Progressing toward goals    Frequency  Min 3X/week    PT Plan Frequency needs to be updated    Co-evaluation             End of Session Equipment Utilized  During Treatment: Gait belt Activity Tolerance: Treatment limited secondary to medical complications (Comment) (vertigo) Patient left: in chair;with call bell/phone within reach;with chair alarm set     Time: 0981-1914 PT Time Calculation (min) (ACUTE ONLY): 24 min  Charges:  $Gait Training: 23-37 mins                    G Codes:      Demarkis Gheen 03/23/15, 10:05 AM  Pager [phone]

## 2015-03-13 NOTE — Progress Notes (Signed)
Patient discharged to [facility] report called to facility IV removed no pain she is alert and oriented at baseline.

## 2015-03-13 NOTE — Care Management Note (Signed)
Case Management Note  Patient Details  Name: Nancy Hahn MRN: [id number] Date of Birth: December 07, 1931  Subjective/Objective:                    Action/Plan: Patient is discharging today to [facility]. No further needs per CM.   Expected Discharge Date:                  Expected Discharge Plan:  Home w Home Health Services  In-House Referral:     Discharge planning Services  CM Consult  Post Acute Care Choice:    Choice offered to:     DME Arranged:    DME Agency:     HH Arranged:    HH Agency:     Status of Service:  In process, will continue to follow  Medicare Important Message Given:  Yes Date Medicare IM Given:    Medicare IM give by:    Date Additional Medicare IM Given:    Additional Medicare Important Message give by:     If discussed at Long Length of Stay Meetings, dates discussed:    Additional Comments:  Kermit Balo, RN 03/13/2015, 3:55 PM

## 2015-03-13 NOTE — Progress Notes (Signed)
Subjective: No complaints.  Wondering when she can go home.  Objective: Vital signs in last 24 hours: Temp:  [97.5 F (36.4 C)-98.7 F (37.1 C)] 97.7 F (36.5 C) (01/31 0537) Pulse Rate:  [62-81] 67 (01/31 0537) Resp:  [11-17] 16 (01/31 0537) BP: (113-171)/(38-75) 133/54 mmHg (01/31 0537) SpO2:  [92 %-100 %] 98 % (01/31 0537) Last BM Date: 03/11/15  Intake/Output from previous day:   Intake/Output this shift:    General appearance: alert and no distress GI: soft, non-tender; bowel sounds normal; no masses,  no organomegaly  Lab Results:  Recent Labs  03/10/15 0740 03/11/15 0504 03/12/15 0440  WBC 10.1 9.3 7.9  HGB 9.9* 8.9* 8.8*  HCT 29.8* 27.5* 27.9*  PLT 422* 396 418*   BMET  Recent Labs  03/11/15 1050  NA 139  K 4.0  CL 104  CO2 26  GLUCOSE 149*  BUN 38*  CREATININE 0.97  CALCIUM 9.0   LFT No results for input(s): PROT, ALBUMIN, AST, ALT, ALKPHOS, BILITOT, BILIDIR, IBILI in the last 72 hours. PT/INR No results for input(s): LABPROT, INR in the last 72 hours. Hepatitis Panel No results for input(s): HEPBSAG, HCVAB, HEPAIGM, HEPBIGM in the last 72 hours. C-Diff No results for input(s): CDIFFTOX in the last 72 hours. Fecal Lactopherrin No results for input(s): FECLLACTOFRN in the last 72 hours.  Studies/Results: No results found.  Medications:  Scheduled: . carvedilol  6.25 mg Oral BID WC  . feeding supplement (ENSURE ENLIVE)  237 mL Oral BID BM  . hydrALAZINE  50 mg Oral 3 times per day  . insulin aspart  0-9 Units Subcutaneous TID WC  . ondansetron (ZOFRAN) IV  4 mg Intravenous 4 times per day  . pantoprazole (PROTONIX) IV  40 mg Intravenous Q12H  . simvastatin  40 mg Oral q1800   Continuous:   Assessment/Plan: 1) Gastric ulcer. 2) Anemia.   Her H. Pylori Ab was mildly elevated and it qualifies as being positive.  I think it is reasonable to treat her with antibiotics.    Plan: 1) Quadruple therapy x 14 days. 2) Signing off.  LOS:  10 days   Nancy Hahn 03/13/2015, 7:31 AM

## 2015-03-13 NOTE — Clinical Social Work Note (Signed)
Clinical Social Worker facilitated patient discharge including contacting patient family and facility to confirm patient discharge plans.  Clinical information faxed to facility and family agreeable with plan.  CSW arranged ambulance transport via [facility] to [facility].  RN to call report prior to discharge.  Clinical Social Worker will sign off for now as social work intervention is no longer needed. Please consult [place] again if new need arises.  Derenda Fennel, MSW, LCSWA [phone] 03/13/2015 2:46 PM

## 2015-03-13 NOTE — Clinical Social Work Note (Signed)
Clinical Social Worker facilitated patient discharge including contacting patient family and facility to confirm patient discharge plans.  Clinical information faxed to facility and family agreeable with plan.  CSW arranged ambulance transport via [facility] to [facility].  RN to call report prior to discharge.  Clinical Social Worker will sign off for now as social work intervention is no longer needed. Please consult [place] again if new need arises.  Derenda Fennel, MSW, LCSWA [phone] 03/13/2015 10:44 AM

## 2015-03-13 NOTE — Progress Notes (Signed)
Have had multiple discussions with Dr. Roda Shutters, cardiology team and also vascular surgery at [facility]. Unfortunately has had issues with IV access and on unable to complete a TEE. 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 Reviewed prior cardiology note-and discussed with cardiology-they feel that this is a reasonable as well, and they will try and arrange for a outpatient 30 day event monitor-as high suspicion for atrial fibrillation in the setting. I subsequently spoke with patient's sister over the phone Nancy Hahn-explained difficulty with TEE, and recommendations from numerous subspecialists as noted above. I explained to her risks of life-threatening/life disabling bleeding on anticoagulation, I also explained to her risks of further embolic events that could be life-threatening/life disabling in the absence of anticoagulation. After much discussion, Nancy Hahn was agreeable to starting anticoagulation-as recommended by gastroenterology-we will start Eliquis one week from 1/29 (see Dr. Ardell Isaacs note on 1/29). Stable for discharge today to SNF.

## 2015-03-15 ENCOUNTER — Other Ambulatory Visit: Payer: Self-pay | Admitting: Nurse Practitioner

## 2015-03-15 DIAGNOSIS — K259 Gastric ulcer, unspecified as acute or chronic, without hemorrhage or perforation: Secondary | ICD-10-CM | POA: Diagnosis not present

## 2015-03-15 DIAGNOSIS — I4891 Unspecified atrial fibrillation: Secondary | ICD-10-CM

## 2015-03-15 DIAGNOSIS — I509 Heart failure, unspecified: Secondary | ICD-10-CM | POA: Diagnosis not present

## 2015-03-15 DIAGNOSIS — I10 Essential (primary) hypertension: Secondary | ICD-10-CM | POA: Diagnosis not present

## 2015-03-15 DIAGNOSIS — I739 Peripheral vascular disease, unspecified: Secondary | ICD-10-CM | POA: Diagnosis not present

## 2015-03-15 DIAGNOSIS — I6350 Cerebral infarction due to unspecified occlusion or stenosis of unspecified cerebral artery: Secondary | ICD-10-CM | POA: Diagnosis not present

## 2015-03-15 DIAGNOSIS — E119 Type 2 diabetes mellitus without complications: Secondary | ICD-10-CM | POA: Diagnosis not present

## 2015-03-15 DIAGNOSIS — I639 Cerebral infarction, unspecified: Secondary | ICD-10-CM

## 2015-03-19 ENCOUNTER — Encounter: Payer: Self-pay | Admitting: *Deleted

## 2015-03-19 NOTE — Progress Notes (Signed)
Patient ID: Nancy Hahn, female   DOB: Jan 18, 1932, 82 y.o.   MRN: [id number] Patient did not show up for 03/19/15, 12:00 PM, appointment to have a 30 day cardiac event monitor applied.

## 2015-03-26 ENCOUNTER — Telehealth: Payer: Self-pay | Admitting: *Deleted

## 2015-03-26 ENCOUNTER — Telehealth: Payer: Self-pay | Admitting: Nurse Practitioner

## 2015-03-26 ENCOUNTER — Encounter (INDEPENDENT_AMBULATORY_CARE_PROVIDER_SITE_OTHER): Payer: Commercial Managed Care - HMO

## 2015-03-26 DIAGNOSIS — I4891 Unspecified atrial fibrillation: Secondary | ICD-10-CM

## 2015-03-26 DIAGNOSIS — I639 Cerebral infarction, unspecified: Secondary | ICD-10-CM

## 2015-03-26 NOTE — Telephone Encounter (Signed)
Pt placed on [employer]ionet 30 day monitoring by myself this morning. Received call from [employer] today at 12:45p reporting urgent EKG notification for 10:11am (approx time of monitor placement).  Notification is for "new onset Atrial Fibrillation which is also patient's baseline". Showing average of 80 BPM.  At visit pt reports she had been on Eliquis, but was taken off of Eliquis recently due to hemorrhagic stroke.  Fax incoming w/ strip reading. Will defer to Dr. Jens Som ([facility]) for advice.

## 2015-03-26 NOTE — Telephone Encounter (Signed)
Discussed with Dr Johney Frame who saw pt in hospital; will obtain and fax rhythm strip of atrial fibrillation to him; pt needs to be on anticoagulation if possible but also with GI Bleed and hemorrhagic transformation of recent CVA in hospital; he will decide about anticoagulation Nancy Hahn

## 2015-03-26 NOTE — Telephone Encounter (Signed)
Left message for patient's sister to call.  AF identified on event monitor. Per DC summary 03-13-15, ok to start Eliquis 03-18-15.  Need to verify that patient is on Eliquis currently.  I have spoken with neurology who feels that from their standpoint, OAC is ok to start now.   Gypsy Balsam, NP 03/26/2015 4:07 PM

## 2015-03-26 NOTE — Telephone Encounter (Signed)
Follow up:  Pt's sister called back in stating that she is no longer taking Eliquis.

## 2015-03-26 NOTE — Patient Instructions (Signed)
Cardiac Event Monitoring °A cardiac event monitor is a small recording device used to help detect abnormal heart rhythms (arrhythmias). The monitor is used to record heart rhythm when noticeable symptoms such as the following occur: °· Fast heartbeats (palpitations), such as heart racing or fluttering. °· Dizziness. °· Fainting or light-headedness. °· Unexplained weakness. °The monitor is wired to two electrodes placed on your chest. Electrodes are flat, sticky disks that attach to your skin. The monitor can be worn for up to 30 days. You will wear the monitor at all times, except when bathing.  °HOW TO USE YOUR CARDIAC EVENT MONITOR °A technician will prepare your chest for the electrode placement. The technician will show you how to place the electrodes, how to work the monitor, and how to replace the batteries. Take time to practice using the monitor before you leave the office. Make sure you understand how to send the information from the monitor to your health care provider. This requires a telephone with a landline, not a cell phone. You need to: °· Wear your monitor at all times, except when you are in water: °¨ Do not get the monitor wet. °¨ Take the monitor off when bathing. Do not swim or use a hot tub with it on. °· Keep your skin clean. Do not put body lotion or moisturizer on your chest. °· Change the electrodes daily or any time they stop sticking to your skin. You might need to use tape to keep them on. °· It is possible that your skin under the electrodes could become irritated. To keep this from happening, try to put the electrodes in slightly different places on your chest. However, they must remain in the area under your left breast and in the upper right section of your chest. °· Make sure the monitor is safely clipped to your clothing or in a location close to your body that your health care provider recommends. °· Press the button to record when you feel symptoms of heart trouble, such as  dizziness, weakness, light-headedness, palpitations, thumping, shortness of breath, unexplained weakness, or a fluttering or racing heart. The monitor is always on and records what happened slightly before you pressed the button, so do not worry about being too late to get good information. °· Keep a diary of your activities, such as walking, doing chores, and taking medicine. It is especially important to note what you were doing when you pushed the button to record your symptoms. This will help your health care provider determine what might be contributing to your symptoms. The information stored in your monitor will be reviewed by your health care provider alongside your diary entries. °· Send the recorded information as recommended by your health care provider. It is important to understand that it will take some time for your health care provider to process the results. °· Change the batteries as recommended by your health care provider. °SEEK IMMEDIATE MEDICAL CARE IF:  °· You have chest pain. °· You have extreme difficulty breathing or shortness of breath. °· You develop a very fast heartbeat that persists. °· You develop dizziness that does not go away. °· You faint or constantly feel you are about to faint. °  °This information is not intended to replace advice given to you by your health care provider. Make sure you discuss any questions you have with your health care provider. °  °Document Released: 11/06/2007 Document Revised: 02/17/2014 Document Reviewed: 07/26/2012 °Elsevier Interactive Patient Education ©2016 Elsevier Inc. ° °

## 2015-03-26 NOTE — Progress Notes (Unsigned)
Pt came in for 30 day monitor per Dr. Larwance Sachs following hospitalization for stroke r/t clot.

## 2015-03-26 NOTE — Telephone Encounter (Signed)
Called [employer] - they are faxing strip directly to [facility] office to attn of Dr. Johney Frame.  Note that I saw pt today for monitor placement. Sister was with her and is managing her care.  Leonette Nutting is pt's sister and listed DPR contact. Please call at [phone].

## 2015-03-27 DIAGNOSIS — I6350 Cerebral infarction due to unspecified occlusion or stenosis of unspecified cerebral artery: Secondary | ICD-10-CM | POA: Diagnosis not present

## 2015-03-27 DIAGNOSIS — I739 Peripheral vascular disease, unspecified: Secondary | ICD-10-CM | POA: Diagnosis not present

## 2015-03-27 DIAGNOSIS — I509 Heart failure, unspecified: Secondary | ICD-10-CM | POA: Diagnosis not present

## 2015-03-27 NOTE — Telephone Encounter (Signed)
Spoke with patient's sister. She has not resumed Eliquis post discharge as outlined in DC summary.  Explained need for Eliquis with atrial fibrillation and sister understands and agrees.  Pt is currently still at [facility] in [place]. Spoke with her nurse, will check CBC and BMET today.  If stable, will discontinue ASA and start Eliquis  bid. Verbal order given to RN at [facility]. She will keep follow up with me on 04/26/15.    Gypsy Balsam, NP 03/27/2015 9:58 AM

## 2015-03-27 NOTE — Telephone Encounter (Signed)
Follow up    Please call pt sister about sisters care and last visit 03-26-15

## 2015-03-27 NOTE — Telephone Encounter (Signed)
See [employer] note from today

## 2015-03-28 ENCOUNTER — Encounter: Payer: Self-pay | Admitting: Cardiology

## 2015-03-28 DIAGNOSIS — I6789 Other cerebrovascular disease: Secondary | ICD-10-CM | POA: Diagnosis not present

## 2015-03-28 DIAGNOSIS — D500 Iron deficiency anemia secondary to blood loss (chronic): Secondary | ICD-10-CM | POA: Diagnosis not present

## 2015-03-28 DIAGNOSIS — K254 Chronic or unspecified gastric ulcer with hemorrhage: Secondary | ICD-10-CM | POA: Diagnosis not present

## 2015-03-30 ENCOUNTER — Telehealth: Payer: Self-pay | Admitting: *Deleted

## 2015-03-30 NOTE — Telephone Encounter (Signed)
Spoke with Erie Noe nurse at [facility] and gave to go ahead to start Eliquis as her labs are good.

## 2015-04-03 DIAGNOSIS — I679 Cerebrovascular disease, unspecified: Secondary | ICD-10-CM | POA: Diagnosis not present

## 2015-04-03 DIAGNOSIS — I748 Embolism and thrombosis of other arteries: Secondary | ICD-10-CM | POA: Diagnosis not present

## 2015-04-03 DIAGNOSIS — I639 Cerebral infarction, unspecified: Secondary | ICD-10-CM | POA: Diagnosis not present

## 2015-04-03 DIAGNOSIS — E119 Type 2 diabetes mellitus without complications: Secondary | ICD-10-CM | POA: Diagnosis not present

## 2015-04-03 DIAGNOSIS — I10 Essential (primary) hypertension: Secondary | ICD-10-CM | POA: Diagnosis not present

## 2015-04-04 DIAGNOSIS — I5031 Acute diastolic (congestive) heart failure: Secondary | ICD-10-CM | POA: Diagnosis not present

## 2015-04-04 DIAGNOSIS — M47892 Other spondylosis, cervical region: Secondary | ICD-10-CM | POA: Diagnosis not present

## 2015-04-04 DIAGNOSIS — E785 Hyperlipidemia, unspecified: Secondary | ICD-10-CM | POA: Diagnosis not present

## 2015-04-04 DIAGNOSIS — I110 Hypertensive heart disease with heart failure: Secondary | ICD-10-CM | POA: Diagnosis not present

## 2015-04-04 DIAGNOSIS — E119 Type 2 diabetes mellitus without complications: Secondary | ICD-10-CM | POA: Diagnosis not present

## 2015-04-04 DIAGNOSIS — Z8673 Personal history of transient ischemic attack (TIA), and cerebral infarction without residual deficits: Secondary | ICD-10-CM | POA: Diagnosis not present

## 2015-04-06 ENCOUNTER — Telehealth: Payer: Self-pay

## 2015-04-06 ENCOUNTER — Other Ambulatory Visit: Payer: Self-pay | Admitting: *Deleted

## 2015-04-06 MED ORDER — DILTIAZEM HCL ER COATED BEADS 120 MG PO CP24: 120.0000 mg | ORAL_CAPSULE | Freq: Every day | ORAL | Status: AC

## 2015-04-06 MED ORDER — DILTIAZEM HCL ER COATED BEADS 120 MG PO CP24: 120.0000 mg | ORAL_CAPSULE | Freq: Every day | ORAL | Status: DC

## 2015-04-06 NOTE — Telephone Encounter (Signed)
Spoke to patient's sister [name].Advised I spoke to patient earlier about episode at 10:56 am AFib fast rate 187.She stated you help her with her medications.Spoke to [employer] PA he advised to start diltiazem 120 mg daily and eliquis 5 mg twice a day.She stated patient already taking eliquis 5 mg.twice a day.Advised to continue all other medications and take diltiazem 120 mg daily.Advised to keep post hospital appointment with Gypsy Balsam NP 04/26/15 at 10:40 am.Advised to call sooner if needed.

## 2015-04-06 NOTE — Telephone Encounter (Signed)
Received call from cardionet calling to report at 10:56 am AFib with rate 187.Auto triggered,patient did not record.  Spoke to patient she was not aware she had fast heart beat at 10:56 am this morning.Advised to continue all medications.Keep post hospital appointment with Gypsy Balsam NP 04/26/15 at 10:40 am at [facility] office.

## 2015-04-09 ENCOUNTER — Telehealth: Payer: Self-pay | Admitting: Cardiology

## 2015-04-09 NOTE — Telephone Encounter (Signed)
Received a call from [employer] calling to report 04/08/15 at 10:02 am AFib with rate 190.Spoke to patient's sister, patient is taking all medication as prescribed.Spoke to [employer] PA he advised continue same medications.Advised keep appointment with Gypsy Balsam NP 04/26/15 at 10:40 am at [facility] office.

## 2015-04-09 NOTE — Telephone Encounter (Signed)
New message       Calling with an abn ekg on a pt wearing a monitor

## 2015-04-11 DIAGNOSIS — Z8673 Personal history of transient ischemic attack (TIA), and cerebral infarction without residual deficits: Secondary | ICD-10-CM | POA: Diagnosis not present

## 2015-04-11 DIAGNOSIS — E785 Hyperlipidemia, unspecified: Secondary | ICD-10-CM | POA: Diagnosis not present

## 2015-04-11 DIAGNOSIS — E119 Type 2 diabetes mellitus without complications: Secondary | ICD-10-CM | POA: Diagnosis not present

## 2015-04-11 DIAGNOSIS — M47892 Other spondylosis, cervical region: Secondary | ICD-10-CM | POA: Diagnosis not present

## 2015-04-11 DIAGNOSIS — I5031 Acute diastolic (congestive) heart failure: Secondary | ICD-10-CM | POA: Diagnosis not present

## 2015-04-11 DIAGNOSIS — I110 Hypertensive heart disease with heart failure: Secondary | ICD-10-CM | POA: Diagnosis not present

## 2015-04-14 DIAGNOSIS — I639 Cerebral infarction, unspecified: Secondary | ICD-10-CM | POA: Diagnosis not present

## 2015-04-16 DIAGNOSIS — E119 Type 2 diabetes mellitus without complications: Secondary | ICD-10-CM | POA: Diagnosis not present

## 2015-04-16 DIAGNOSIS — I5031 Acute diastolic (congestive) heart failure: Secondary | ICD-10-CM | POA: Diagnosis not present

## 2015-04-16 DIAGNOSIS — I110 Hypertensive heart disease with heart failure: Secondary | ICD-10-CM | POA: Diagnosis not present

## 2015-04-16 DIAGNOSIS — E785 Hyperlipidemia, unspecified: Secondary | ICD-10-CM | POA: Diagnosis not present

## 2015-04-16 DIAGNOSIS — M47892 Other spondylosis, cervical region: Secondary | ICD-10-CM | POA: Diagnosis not present

## 2015-04-16 DIAGNOSIS — Z8673 Personal history of transient ischemic attack (TIA), and cerebral infarction without residual deficits: Secondary | ICD-10-CM | POA: Diagnosis not present

## 2015-04-18 DIAGNOSIS — I110 Hypertensive heart disease with heart failure: Secondary | ICD-10-CM | POA: Diagnosis not present

## 2015-04-18 DIAGNOSIS — M47892 Other spondylosis, cervical region: Secondary | ICD-10-CM | POA: Diagnosis not present

## 2015-04-18 DIAGNOSIS — E119 Type 2 diabetes mellitus without complications: Secondary | ICD-10-CM | POA: Diagnosis not present

## 2015-04-18 DIAGNOSIS — I5031 Acute diastolic (congestive) heart failure: Secondary | ICD-10-CM | POA: Diagnosis not present

## 2015-04-18 DIAGNOSIS — Z8673 Personal history of transient ischemic attack (TIA), and cerebral infarction without residual deficits: Secondary | ICD-10-CM | POA: Diagnosis not present

## 2015-04-18 DIAGNOSIS — E785 Hyperlipidemia, unspecified: Secondary | ICD-10-CM | POA: Diagnosis not present

## 2015-04-24 DIAGNOSIS — I639 Cerebral infarction, unspecified: Secondary | ICD-10-CM | POA: Diagnosis not present

## 2015-04-24 DIAGNOSIS — I4891 Unspecified atrial fibrillation: Secondary | ICD-10-CM | POA: Diagnosis not present

## 2015-04-25 ENCOUNTER — Encounter: Payer: Self-pay | Admitting: Nurse Practitioner

## 2015-04-25 DIAGNOSIS — I5031 Acute diastolic (congestive) heart failure: Secondary | ICD-10-CM | POA: Diagnosis not present

## 2015-04-25 DIAGNOSIS — E119 Type 2 diabetes mellitus without complications: Secondary | ICD-10-CM | POA: Diagnosis not present

## 2015-04-25 DIAGNOSIS — M47892 Other spondylosis, cervical region: Secondary | ICD-10-CM | POA: Diagnosis not present

## 2015-04-25 DIAGNOSIS — E785 Hyperlipidemia, unspecified: Secondary | ICD-10-CM | POA: Diagnosis not present

## 2015-04-25 DIAGNOSIS — I110 Hypertensive heart disease with heart failure: Secondary | ICD-10-CM | POA: Diagnosis not present

## 2015-04-25 DIAGNOSIS — Z8673 Personal history of transient ischemic attack (TIA), and cerebral infarction without residual deficits: Secondary | ICD-10-CM | POA: Diagnosis not present

## 2015-04-25 NOTE — Progress Notes (Signed)
Electrophysiology Office Note Date: 04/26/2015  ID:  Nancy Hahn, DOB [birth year], MRN [id number]  PCP: Rozanna Box, MD Electrophysiologist: Allred  CC: atrial fibrillation follow up  Nancy Hahn is a 82 y.o. female seen today for Dr Johney Frame.  She was admitted in Jan of this year with subclavian thrombus and CVA.  An event monitor was placed which has demonstrated AF.  She presents today for follow up. She has been started on Eliquis and is tolerating without bleeding issues.  Since last being seen in our clinic, the patient reports doing reasonably well. She is recovering from her stroke and is living at home now. She denies chest pain, palpitations, dyspnea, PND, orthopnea, nausea, vomiting, dizziness, syncope, edema, weight gain, or early satiety.  She has not had bleeding complications.   Past Medical History  Diagnosis Date  . Breast mass   . Hypertension   . Stroke (cerebrum) (HCC)   . Atrial fibrillation [facility])    Past Surgical History  Procedure Laterality Date  . Breast surgery  1980    biopsy  . Dilation and curettage of uterus    . Foot surgery    . Peripheral vascular catheterization Left 02/28/2015    Procedure: Upper Extremity Angiography;  Surgeon: Renford Dills, MD;  Location: [facility] INVASIVE CV LAB;  Service: Cardiovascular;  Laterality: Left;  . Peripheral vascular catheterization  02/28/2015    Procedure: Upper Extremity Intervention;  Surgeon: Renford Dills, MD;  Location: [facility] INVASIVE CV LAB;  Service: Cardiovascular;;  . Embolectomy Left 02/28/2015    Procedure: EMBOLECTOMY ;  Surgeon: Renford Dills, MD;  Location: [facility] ORS;  Service: Vascular;  Laterality: Left;  . Esophagogastroduodenoscopy N/A 03/10/2015    Procedure: ESOPHAGOGASTRODUODENOSCOPY (EGD);  Surgeon: Meryl Dare, MD;  Location: [facility] ENDOSCOPY;  Service: Endoscopy;  Laterality: N/A;    Current Outpatient Prescriptions  Medication Sig Dispense Refill  . apixaban (ELIQUIS) 5 MG  TABS tablet Take 1 tablet (5 mg total) by mouth 2 (two) times daily. 60 tablet 6  . Calcium Carbonate-Vitamin D 600-400 MG-UNIT tablet Take by mouth.    . carvedilol (COREG) 6.25 MG tablet Take by mouth.    . diltiazem (CARDIZEM CD) 120 MG 24 hr capsule Take 1 capsule (120 mg total) by mouth daily. 30 capsule 1  . hydrALAZINE (APRESOLINE) 50 MG tablet Take 1 tablet (50 mg total) by mouth every 8 (eight) hours.    Marland Kitchen lisinopril (PRINIVIL,ZESTRIL) 40 MG tablet Take 40 mg by mouth daily.     . metFORMIN (GLUCOPHAGE) 500 MG tablet Take by mouth daily with breakfast.    . pantoprazole (PROTONIX) 40 MG tablet Take 1 tablet (40 mg total) by mouth 2 (two) times daily.    . simvastatin (ZOCOR) 20 MG tablet Take 20 mg by mouth daily.     No current facility-administered medications for this visit.    Allergies:   Review of patient's allergies indicates no known allergies.   Social History: Social History   Social History  . Marital Status: Widowed    Spouse Name: N/A  . Number of Children: N/A  . Years of Education: N/A   Occupational History  . Not on file.   Social History Main Topics  . Smoking status: Never Smoker   . Smokeless tobacco: Never Used  . Alcohol Use: No  . Drug Use: No  . Sexual Activity: Not on file   Other Topics Concern  . Not on file  Social History Narrative    Family History: Family History  Problem Relation Age of Onset  . Emphysema Father   . Cancer Brother     lung  . Cancer Sister     breast  . Cancer Maternal Aunt     ovarian  . Diabetes type II Mother   . Diabetes type II Sister     x3    Review of Systems: All other systems reviewed and are otherwise negative except as noted above.   Physical Exam: VS:  BP 118/60 mmHg  Pulse 62  Ht 5\' 8"  (1.727 m)  Wt 181 lb 9.6 oz (82.373 kg)  BMI 27.62 kg/m2 , BMI Body mass index is 27.62 kg/(m^2). Wt Readings from Last 3 Encounters:  04/26/15 181 lb 9.6 oz (82.373 kg)  03/12/15 184 lb 1.6 oz  (83.507 kg)  03/03/15 198 lb 10.2 oz (90.1 kg)    GEN- The patient is elderly appearing, alert and oriented x 3 today.   HEENT: normocephalic, atraumatic; sclera clear, conjunctiva pink; hearing intact; oropharynx clear; neck supple Lungs- Clear to ausculation bilaterally, normal work of breathing.  No wheezes, rales, rhonchi Heart- Irregular rate and rhythm, no murmurs, rubs or gallops, PMI not laterally displaced GI- soft, non-tender, non-distended, bowel sounds present Extremities- no clubbing, cyanosis, or edema; DP/PT/radial pulses 2+ bilaterally MS- no significant deformity or atrophy Skin- warm and dry, no rash or lesion  Psych- euthymic mood, full affect Neuro- strength and sensation are intact   EKG:  EKG is not ordered today.  Recent Labs: 03/06/2015: ALT 14 03/08/2015: B Natriuretic Peptide 147.7*; Magnesium 2.1 03/10/2015: TSH 2.029 03/11/2015: BUN 38*; Creatinine, Ser 0.97; Potassium 4.0; Sodium 139 03/12/2015: Hemoglobin 8.8*; Platelets 418*    Other studies Reviewed: Additional studies/ records that were reviewed today include: hospital records   Assessment and Plan: 1.  Permanent atrial fibrillation Continue Eliquis for CHADS2VASC of 5 V rates occasionally elevated on event monitor strips that are available for review, but generally well controlled. Will await formal read, if rates elevated, will increase Coreg at that time. Discussed with patient and sister today.  In the absence of symptoms, would not pursue rhythm control at this time.   2.  HTN Stable No change required today  3.  CVA Per neurology    Current medicines are reviewed at length with the patient today.   The patient does not have concerns regarding her medicines.  The following changes were made today: none  Labs/ tests ordered today include:none   Disposition:   Follow up with [place] office in 3 months (closer for patient)     Signed, Gypsy BalsamAmber Irish Breisch, NP 04/26/2015 11:10 AM   [facility] [address] [place] [address] [phone] (office) [phone] (fax)

## 2015-04-26 ENCOUNTER — Ambulatory Visit (INDEPENDENT_AMBULATORY_CARE_PROVIDER_SITE_OTHER): Payer: Commercial Managed Care - HMO | Admitting: Nurse Practitioner

## 2015-04-26 ENCOUNTER — Encounter: Payer: Self-pay | Admitting: Nurse Practitioner

## 2015-04-26 VITALS — BP 118/60 | HR 62 | Ht 68.0 in | Wt 181.6 lb

## 2015-04-26 DIAGNOSIS — I4821 Permanent atrial fibrillation: Secondary | ICD-10-CM

## 2015-04-26 DIAGNOSIS — I10 Essential (primary) hypertension: Secondary | ICD-10-CM | POA: Diagnosis not present

## 2015-04-26 DIAGNOSIS — I482 Chronic atrial fibrillation: Secondary | ICD-10-CM | POA: Diagnosis not present

## 2015-04-26 NOTE — Patient Instructions (Signed)
Medication Instructions:   Your physician recommends that you continue on your current medications as directed. Please refer to the Current Medication list given to you today.   If you need a refill on your cardiac medications before your next appointment, please call your pharmacy.  Labwork: NONE ORDER TODAY    Testing/Procedures:  NONE ORDER TODAY    Follow-Up:  3 MONTHS TO BE ESTABLISH AS NEW PT IN [place] WITH A PROVIDER FOR AFIB    Any Other Special Instructions Will Be Listed Below (If Applicable).

## 2015-05-02 DIAGNOSIS — I5031 Acute diastolic (congestive) heart failure: Secondary | ICD-10-CM | POA: Diagnosis not present

## 2015-05-02 DIAGNOSIS — E785 Hyperlipidemia, unspecified: Secondary | ICD-10-CM | POA: Diagnosis not present

## 2015-05-02 DIAGNOSIS — Z8673 Personal history of transient ischemic attack (TIA), and cerebral infarction without residual deficits: Secondary | ICD-10-CM | POA: Diagnosis not present

## 2015-05-02 DIAGNOSIS — M47892 Other spondylosis, cervical region: Secondary | ICD-10-CM | POA: Diagnosis not present

## 2015-05-02 DIAGNOSIS — I110 Hypertensive heart disease with heart failure: Secondary | ICD-10-CM | POA: Diagnosis not present

## 2015-05-02 DIAGNOSIS — E119 Type 2 diabetes mellitus without complications: Secondary | ICD-10-CM | POA: Diagnosis not present

## 2015-05-04 ENCOUNTER — Encounter: Payer: Self-pay | Admitting: Neurology

## 2015-05-04 ENCOUNTER — Ambulatory Visit (INDEPENDENT_AMBULATORY_CARE_PROVIDER_SITE_OTHER): Payer: Commercial Managed Care - HMO | Admitting: Neurology

## 2015-05-04 VITALS — BP 143/67 | HR 65 | Ht 68.0 in | Wt 180.0 lb

## 2015-05-04 DIAGNOSIS — I748 Embolism and thrombosis of other arteries: Secondary | ICD-10-CM

## 2015-05-04 DIAGNOSIS — I480 Paroxysmal atrial fibrillation: Secondary | ICD-10-CM | POA: Diagnosis not present

## 2015-05-04 DIAGNOSIS — K922 Gastrointestinal hemorrhage, unspecified: Secondary | ICD-10-CM | POA: Diagnosis not present

## 2015-05-04 DIAGNOSIS — Z7901 Long term (current) use of anticoagulants: Secondary | ICD-10-CM

## 2015-05-04 DIAGNOSIS — I63112 Cerebral infarction due to embolism of left vertebral artery: Secondary | ICD-10-CM

## 2015-05-04 NOTE — Patient Instructions (Addendum)
-   continue eliquis and zocor for stroke prevention - check BP at home - follow up with cardiology as scheduled - follow up with GI and vascular surgery regularly - Follow up with your primary care physician for stroke risk factor modification. Recommend maintain blood pressure goal <130/80, diabetes with hemoglobin A1c goal below 6.5% and lipids with LDL cholesterol goal below 70 mg/dL.  - home exercise and healthy diet - follow up in 3 months.

## 2015-05-04 NOTE — Progress Notes (Signed)
STROKE NEUROLOGY FOLLOW UP NOTE  NAME: Nancy Hahn DOB: September 13, 1931  REASON FOR VISIT: stroke follow up HISTORY FROM: chart  Today we had the pleasure of seeing Nancy Hahn in follow-up at our Neurology Clinic. Pt was accompanied by sister.   History Summary [name] is a 82 y.o. 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 MRI showed large left cerebellar infarct with small hemorrhagic transformation. Repeat CT showed expected evolution of stroke without hydrocephalus. CTA head and neck showed left PICA occlusion. EF 55-60% and TEE not able to do due ot loss of IV access. LDL 88 and A1C 6.7. Her stroke was considered embolic, not due to emboli from subclavian A thrombosis as it is distal to [place] takes off. Also, she developed bloody vomitus, EGD showed single gastric ulcer and gastritis. Due to GIB and hemorrhagic transformation, eliquis was on hold. Her GIB was controlled with protonix. Due to the two embolic events, we recommended to initiate eliquis 7 days post stroke after discussion with GI. She was discharged to SNF after stabilization.   Interval History During the interval time, the patient has been doing well. However, she was not resumed Eliquis post discharge as outlined in DC summary.on 03/26/15, her 30 day cardiac monitoring showed AF with RVR, Eliquis  bid was resumed. She had follow up with cardiology on 04/26/15, doing well. No complains. Now at home.   REVIEW OF SYSTEMS: Full 14 system review of systems performed and notable only for those listed below and in HPI above, all others are negative:  Constitutional:   Cardiovascular:  Ear/Nose/Throat:   Skin:  Eyes:   Respiratory:   Gastroitestinal:   Genitourinary:  Hematology/Lymphatic:   Endocrine:  Musculoskeletal:   Allergy/Immunology:   Neurological:   Psychiatric:  Sleep:   The  following represents the patient's updated allergies and side effects list: No Known Allergies  The neurologically relevant items on the patient's problem list were reviewed on today's visit.  Neurologic Examination  A problem focused neurological exam (12 or more points of the single system neurologic examination, vital signs counts as 1 point, cranial nerves count for 8 points) was performed.  Blood pressure 143/67, pulse 65, height  (1.727 m), weight 180 lb (81.647 kg).  General - Well nourished, well developed, in no apparent distress.  Ophthalmologic - Sharp disc margins OU.   Cardiovascular - Regular rate and rhythm, not in afib.  Mental Status -  Level of arousal and orientation to time, place, and person were intact. Language including expression, naming, comprehension was assessed and found intact. Difficulty with repeating complex sentences Fund of Knowledge was assessed and was intact.  Cranial Nerves II - XII - II - Visual field intact OU. III, IV, VI - Extraocular movements intact. V - Facial sensation intact bilaterally. VII - Facial movement intact bilaterally. VIII - Hearing & vestibular intact bilaterally. X - Palate elevates symmetrically. XI - Chin turning & shoulder shrug intact bilaterally. XII - Tongue protrusion intact.  Motor Strength - The patient's strength was normal in all extremities and pronator drift was absent.  Bulk was normal and fasciculations were absent.   Motor Tone - Muscle tone was assessed at the neck and appendages and was normal.  Reflexes - The patient's reflexes were 1+ in all extremities and she had no pathological reflexes.  Sensory - Light touch, temperature/pinprick, vibration and  proprioception, and Romberg testing were assessed and were normal.    Coordination - The patient had normal movements in the hands and feet with no ataxia or dysmetria. However, mild incoordination on the left hand with rapid alternative movements.  Tremor was absent.  Gait and Station - The patient's transfers, posture, gait, station, and turns were observed as normal.  Data reviewed: I personally reviewed the images and agree with the radiology interpretations.  Mr Laqueta Jean Wo Contrast 03/03/2015  Large acute hemorrhagic left mid to inferior cerebellar infarct with associated mass effect compressing the fourth ventricle. Interval slight increase in size of ventricles compared to recent CT consistent with developing hydrocephalus. Left vertebral artery is at least partially patent. Limited assessment of the left posterior inferior cerebellar artery.   CT HEAD 03/10/2015 Expected evolutionary changes of the inferior left cerebellar infarction. Less swelling and mass effect than on the previous studies. No evidence of complication such as hemorrhage. No new infarction. 03/06/2015 Large left cerebellar infarct with hemorrhage better appreciated on recently performed MR. Mass-effect upon the adjacent structures including narrowing of the fourth ventricle and inferior aspect of the aqueduct has progressed slightly since the prior exam (series 201, image 10). Dilated lateral ventricles/ mild hydrocephalus similar to prior exam (best appreciated when compared to remote CT).  CTA NECK 03/06/2015 Plaque carotid bifurcation / proximal internal carotid artery bilaterally with maximal 50% diameter stenosis proximal internal carotid artery bilaterally. Right vertebral artery is slightly larger than left. No significant stenosis of the cervical segment of the vertebral arteries on either side. Small to slightly moderate-size bilateral pleural effusions with adjacent passive atelectasis. Endotracheal tube and nasogastric tube in place.  CTA HEAD 03/06/2015 Mild narrowing irregularity M1 segment left middle cerebral artery. Mild narrowing right middle cerebral artery bifurcation. Calcified plaque with mild narrowing cavernous segment internal carotid  artery bilaterally. Narrowing of the left vertebral artery from the foramen magnum to formation of the basilar artery. Poor delineation of the left posterior inferior cerebellar artery and appears occluded. No significant stenosis of the distal right vertebral artery. Mild irregularity of the basilar artery without significant stenosis. Irregular anterior inferior cerebellar arteries bilaterally. Fetal type contribution to formation of the left posterior cerebral artery. No high-grade stenosis of the posterior cerebral artery however, distal branch vessel narrowing and irregularity greater on the right.  Upper endoscopy 03/10/2015  1. Single ulcer in the gastric body 2. Erosive gastritis in the gastric body 3. The EGD otherwise appeared normal  TTE  - Left ventricle: The cavity size was normal. Systolic function was normal. The estimated ejection fraction was in the range of 55%to 60%. Wall motion was normal; there were no regional wallmotion abnormalities. Features are consistent with a pseudonormalleft ventricular filling pattern, with concomitant abnormalrelaxation and increased filling pressure (grade 2 diastolicdysfunction). - Mitral valve: Mildly calcified annulus. There was mildregurgitation. - Left atrium: The atrium was mildly dilated.  TEE - not able to perform due to loss of IV access.   Component     Latest Ref Rng 03/10/2015  Cholesterol     0 - 200 mg/dL 161  Triglycerides     <150 mg/dL 096  HDL Cholesterol     >40 mg/dL 30 (L)  Total CHOL/HDL Ratio      4.8  VLDL     0 - 40 mg/dL 26  LDL (calc)     0 - 99 mg/dL 88  Hemoglobin E4V     4.8 - 5.6 % 6.7 (H)  Mean  Plasma Glucose      146  TSH     0.350 - 4.500 uIU/mL 2.029  Vitamin B12     180 - 914 pg/mL 472    Assessment: As you may recall, she is a 82 y.o. 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 Repeat CT showed expected evolution of stroke without hydrocephalus. CTA head and neck showed left PICA occlusion. EF 55-60% and TEE not able to do due ot loss of IV access. LDL 88 and A1C 6.7. Also, she developed UGIB, EGD showed single gastric ulcer and gastritis. Due to GIB and hemorrhagic transformation, eliquis was on hold. Due to the two embolic events, we recommended to initiate eliquis 7 days post stroke after discussion with GI. She was discharged to SNF. However, she was not resumed Eliquis post discharge.on 03/26/15, her 30 day cardiac monitoring showed AF with RVR, Eliquis 5mg  bid was resumed at that time. She had follow up with cardiology on 04/26/15, doing well.   Plan:  - continue eliquis and zocor for stroke prevention - check BP at home - follow up with cardiology as scheduled - follow up with GI and vascular surgery regularly - Follow up with your primary care physician for stroke risk factor modification. Recommend maintain blood pressure goal <130/80, diabetes with hemoglobin A1c goal below 6.5% and lipids with LDL cholesterol goal below 70 mg/dL.  - home exercise and healthy diet - follow up in 3 months.   I spent more than 25 minutes of face to face time with the patient. Greater than 50% of time was spent in counseling and coordination of care.   No orders of the defined types were placed in this encounter.    No orders of the defined types were placed in this encounter.    Patient Instructions  - continue eliquis and zocor for stroke prevention - check BP at home - follow up with cardiology as scheduled - follow up with GI and vascular surgery regularly - Follow up with your primary care physician for stroke risk factor modification. Recommend maintain blood pressure goal <130/80, diabetes with hemoglobin A1c goal below 6.5% and lipids with LDL cholesterol goal below 70 mg/dL.  - home exercise and  healthy diet - follow up in 3 months.      Marvel PlanJindong Adonus Uselman, MD PhD [facility] [address], [address] [place], [place] [address] [phone]

## 2015-05-05 DIAGNOSIS — I63112 Cerebral infarction due to embolism of left vertebral artery: Secondary | ICD-10-CM | POA: Insufficient documentation

## 2015-05-05 DIAGNOSIS — K922 Gastrointestinal hemorrhage, unspecified: Secondary | ICD-10-CM | POA: Insufficient documentation

## 2015-05-05 DIAGNOSIS — Z7901 Long term (current) use of anticoagulants: Secondary | ICD-10-CM | POA: Insufficient documentation

## 2015-05-05 DIAGNOSIS — I480 Paroxysmal atrial fibrillation: Secondary | ICD-10-CM | POA: Insufficient documentation

## 2015-05-09 DIAGNOSIS — E785 Hyperlipidemia, unspecified: Secondary | ICD-10-CM | POA: Diagnosis not present

## 2015-05-09 DIAGNOSIS — M47892 Other spondylosis, cervical region: Secondary | ICD-10-CM | POA: Diagnosis not present

## 2015-05-09 DIAGNOSIS — I5031 Acute diastolic (congestive) heart failure: Secondary | ICD-10-CM | POA: Diagnosis not present

## 2015-05-09 DIAGNOSIS — E119 Type 2 diabetes mellitus without complications: Secondary | ICD-10-CM | POA: Diagnosis not present

## 2015-05-09 DIAGNOSIS — Z8673 Personal history of transient ischemic attack (TIA), and cerebral infarction without residual deficits: Secondary | ICD-10-CM | POA: Diagnosis not present

## 2015-05-09 DIAGNOSIS — I110 Hypertensive heart disease with heart failure: Secondary | ICD-10-CM | POA: Diagnosis not present

## 2015-05-16 DIAGNOSIS — I110 Hypertensive heart disease with heart failure: Secondary | ICD-10-CM | POA: Diagnosis not present

## 2015-05-16 DIAGNOSIS — E785 Hyperlipidemia, unspecified: Secondary | ICD-10-CM | POA: Diagnosis not present

## 2015-05-16 DIAGNOSIS — Z8673 Personal history of transient ischemic attack (TIA), and cerebral infarction without residual deficits: Secondary | ICD-10-CM | POA: Diagnosis not present

## 2015-05-16 DIAGNOSIS — M47892 Other spondylosis, cervical region: Secondary | ICD-10-CM | POA: Diagnosis not present

## 2015-05-16 DIAGNOSIS — E119 Type 2 diabetes mellitus without complications: Secondary | ICD-10-CM | POA: Diagnosis not present

## 2015-05-16 DIAGNOSIS — I5031 Acute diastolic (congestive) heart failure: Secondary | ICD-10-CM | POA: Diagnosis not present

## 2015-05-24 DIAGNOSIS — I110 Hypertensive heart disease with heart failure: Secondary | ICD-10-CM | POA: Diagnosis not present

## 2015-05-24 DIAGNOSIS — E119 Type 2 diabetes mellitus without complications: Secondary | ICD-10-CM | POA: Diagnosis not present

## 2015-05-24 DIAGNOSIS — I5031 Acute diastolic (congestive) heart failure: Secondary | ICD-10-CM | POA: Diagnosis not present

## 2015-05-24 DIAGNOSIS — M47892 Other spondylosis, cervical region: Secondary | ICD-10-CM | POA: Diagnosis not present

## 2015-05-24 DIAGNOSIS — E785 Hyperlipidemia, unspecified: Secondary | ICD-10-CM | POA: Diagnosis not present

## 2015-05-24 DIAGNOSIS — Z8673 Personal history of transient ischemic attack (TIA), and cerebral infarction without residual deficits: Secondary | ICD-10-CM | POA: Diagnosis not present

## 2015-06-04 DIAGNOSIS — E119 Type 2 diabetes mellitus without complications: Secondary | ICD-10-CM | POA: Diagnosis not present

## 2015-06-04 DIAGNOSIS — Z79899 Other long term (current) drug therapy: Secondary | ICD-10-CM | POA: Diagnosis not present

## 2015-06-04 DIAGNOSIS — N183 Chronic kidney disease, stage 3 (moderate): Secondary | ICD-10-CM | POA: Diagnosis not present

## 2015-06-04 DIAGNOSIS — E7800 Pure hypercholesterolemia, unspecified: Secondary | ICD-10-CM | POA: Diagnosis not present

## 2015-06-07 DIAGNOSIS — Z79899 Other long term (current) drug therapy: Secondary | ICD-10-CM | POA: Diagnosis not present

## 2015-06-07 DIAGNOSIS — E7800 Pure hypercholesterolemia, unspecified: Secondary | ICD-10-CM | POA: Diagnosis not present

## 2015-06-07 DIAGNOSIS — I480 Paroxysmal atrial fibrillation: Secondary | ICD-10-CM | POA: Diagnosis not present

## 2015-06-07 DIAGNOSIS — I10 Essential (primary) hypertension: Secondary | ICD-10-CM | POA: Diagnosis not present

## 2015-06-07 DIAGNOSIS — E119 Type 2 diabetes mellitus without complications: Secondary | ICD-10-CM | POA: Diagnosis not present

## 2015-06-07 DIAGNOSIS — I748 Embolism and thrombosis of other arteries: Secondary | ICD-10-CM | POA: Diagnosis not present

## 2015-07-31 ENCOUNTER — Encounter (INDEPENDENT_AMBULATORY_CARE_PROVIDER_SITE_OTHER): Payer: Self-pay

## 2015-07-31 ENCOUNTER — Ambulatory Visit (INDEPENDENT_AMBULATORY_CARE_PROVIDER_SITE_OTHER): Payer: Commercial Managed Care - HMO | Admitting: Cardiology

## 2015-07-31 ENCOUNTER — Encounter: Payer: Self-pay | Admitting: Cardiology

## 2015-07-31 VITALS — BP 130/78 | HR 77 | Ht 68.0 in | Wt 188.8 lb

## 2015-07-31 DIAGNOSIS — I10 Essential (primary) hypertension: Secondary | ICD-10-CM | POA: Diagnosis not present

## 2015-07-31 DIAGNOSIS — I480 Paroxysmal atrial fibrillation: Secondary | ICD-10-CM

## 2015-07-31 MED ORDER — CARVEDILOL 12.5 MG PO TABS: 12.5000 mg | ORAL_TABLET | Freq: Two times a day (BID) | ORAL | Status: AC

## 2015-07-31 NOTE — Progress Notes (Signed)
Cardiology Office Note   Date:  07/31/2015   ID:  [name], DOB 09-May-1931, MRN [id number]  Referring Doctor:  Rozanna Box, MD   Cardiologist:   Almond Lint, MD   Reason for consultation:  Chief Complaint  Patient presents with  . other    Afib. Meds reviewed verbally with pt.      History of Present Illness: Nancy Hahn is a 82 y.o. female who presents for Follow-up for atrial fibrillation.  Patient used to see Dr.Allred in [place] but would like to transfer her care here since she is closer to [place].  In terms of atrial fib ablation, she does not feel any palpitations. She is not aware of any change in rhythm.  She denies chest pain, shortness of breath, loss of consciousness.   ROS:  Please see the history of present illness. Aside from mentioned under HPI, all other systems are reviewed and negative.     Past Medical History  Diagnosis Date  . Breast mass   . Hypertension   . Stroke (cerebrum) (HCC)   . Atrial fibrillation [facility])     Past Surgical History  Procedure Laterality Date  . Breast surgery  1980    biopsy  . Dilation and curettage of uterus    . Foot surgery    . Peripheral vascular catheterization Left 02/28/2015    Procedure: Upper Extremity Angiography;  Surgeon: Renford Dills, MD;  Location: [facility] INVASIVE CV LAB;  Service: Cardiovascular;  Laterality: Left;  . Peripheral vascular catheterization  02/28/2015    Procedure: Upper Extremity Intervention;  Surgeon: Renford Dills, MD;  Location: [facility] INVASIVE CV LAB;  Service: Cardiovascular;;  . Embolectomy Left 02/28/2015    Procedure: EMBOLECTOMY ;  Surgeon: Renford Dills, MD;  Location: [facility] ORS;  Service: Vascular;  Laterality: Left;  . Esophagogastroduodenoscopy N/A 03/10/2015    Procedure: ESOPHAGOGASTRODUODENOSCOPY (EGD);  Surgeon: Meryl Dare, MD;  Location: [facility] ENDOSCOPY;  Service: Endoscopy;  Laterality: N/A;     reports that she has never smoked. She has  never used smokeless tobacco. She reports that she does not drink alcohol or use illicit drugs.   family history includes Cancer in her brother, maternal aunt, and sister; Diabetes type II in her mother and sister; Emphysema in her father.   Current Outpatient Prescriptions  Medication Sig Dispense Refill  . apixaban (ELIQUIS) 5 MG TABS tablet Take 1 tablet (5 mg total) by mouth 2 (two) times daily. 60 tablet 6  . Calcium Carbonate-Vitamin D 600-400 MG-UNIT tablet Take by mouth.    . carvedilol (COREG) 6.25 MG tablet Take 6.25 mg by mouth 2 (two) times daily with a meal.     . diltiazem (CARDIZEM CD) 120 MG 24 hr capsule Take 1 capsule (120 mg total) by mouth daily. 30 capsule 1  . hydrALAZINE (APRESOLINE) 50 MG tablet Take 1 tablet (50 mg total) by mouth every 8 (eight) hours.    Marland Kitchen lisinopril (PRINIVIL,ZESTRIL) 40 MG tablet Take 40 mg by mouth daily.     . metFORMIN (GLUCOPHAGE) 500 MG tablet Take by mouth daily with breakfast.    . pantoprazole (PROTONIX) 40 MG tablet Take 1 tablet (40 mg total) by mouth 2 (two) times daily.    . simvastatin (ZOCOR) 20 MG tablet Take 20 mg by mouth daily.     No current facility-administered medications for this visit.    Allergies: Review of patient's allergies indicates no known allergies.  PHYSICAL EXAM: VS:  BP 130/78 mmHg  Pulse 77  Ht 5\' 8"  (1.727 m)  Wt 188 lb 12 oz (85.616 kg)  BMI 28.71 kg/m2 , Body mass index is 28.71 kg/(m^2). Wt Readings from Last 3 Encounters:  07/31/15 188 lb 12 oz (85.616 kg)  05/04/15 180 lb (81.647 kg)  04/26/15 181 lb 9.6 oz (82.373 kg)    GENERAL:  well developed, well nourished, not in acute distress HEENT: normocephalic, pink conjunctivae, anicteric sclerae, no xanthelasma, normal dentition, oropharynx clear NECK:  no neck vein engorgement, JVP normal, no hepatojugular reflux, carotid upstroke brisk and symmetric, no bruit, no thyromegaly, no lymphadenopathy LUNGS:  good respiratory effort, clear to  auscultation bilaterally CV:  PMI not displaced, no thrills, no lifts, S1 and S2 within normal limits, no palpable S3 or S4, no murmurs, no rubs, no gallops ABD:  Soft, nontender, nondistended, normoactive bowel sounds, no abdominal aortic bruit, no hepatomegaly, no splenomegaly MS: nontender back, no kyphosis, no scoliosis, no joint deformities EXT:  2+ DP/PT pulses, no edema, no varicosities, no cyanosis, no clubbing SKIN: warm, nondiaphoretic, normal turgor, no ulcers NEUROPSYCH: alert, oriented to person, place, and time, sensory/motor grossly intact, normal mood, appropriate affect  Recent Labs: 03/06/2015: ALT 14 03/08/2015: B Natriuretic Peptide 147.7*; Magnesium 2.1 03/10/2015: TSH 2.029 03/11/2015: BUN 38*; Creatinine, Ser 0.97; Potassium 4.0; Sodium 139 03/12/2015: Hemoglobin 8.8*; Platelets 418*   Lipid Panel    Component Value Date/Time   CHOL 144 03/10/2015 0900   TRIG 128 03/10/2015 0900   HDL 30* 03/10/2015 0900   CHOLHDL 4.8 03/10/2015 0900   VLDL 26 03/10/2015 0900   LDLCALC 88 03/10/2015 0900     Other studies Reviewed:  EKG:  The ekg from 07/31/2015 was personally reviewed by me and it revealed atrial fibrillation, ventricular rate of 77 BPM.  Additional studies/ records that were reviewed personally reviewed by me today include:  Echocardiogram on 18 2017: Left ventricle: The cavity size was normal. Systolic function was  normal. The estimated ejection fraction was in the range of 55%  to 60%. Wall motion was normal; there were no regional wall  motion abnormalities. Features are consistent with a pseudonormal  left ventricular filling pattern, with concomitant abnormal  relaxation and increased filling pressure (grade 2 diastolic  dysfunction). - Mitral valve: Mildly calcified annulus. There was mild  regurgitation. - Left atrium: The atrium was mildly dilated.  Cardiac monitor from 03/27/2015, Dr. Ebony Hailrenshaw A. fib with RVR  ASSESSMENT AND  PLAN:  Atrial fibrillation, likely persistent Patient not symptomatic, and his rate control Review of event monitor shows VR 78-190 Recommend increase carvedilol to 12.5 mg twice a day. Continue to monitor blood pressure. Next Continue stroke risk reduction with Eliquis. Patient tolerating this. No recent falls.  Hypertension BP is well controlled. Continue monitoring BP. Continue current medical therapy and lifestyle changes.   Current medicines are reviewed at length with the patient today.  The patient does not have concerns regarding medicines.  Labs/ tests ordered today include: Orders Placed This Encounter  Procedures  . EKG 12-Lead    I had a lengthy and detailed discussion with the patient regarding diagnoses, prognosis, diagnostic options, treatment options , and side effects of medications.   I counseled the patient on importance of lifestyle modification including heart healthy diet, regular physical activity.   Disposition:   FU with undersignedIn 3 months   Signed, Almond LintAileen Markevius Trombetta, MD  07/31/2015 4:23 PM    [facility]

## 2015-07-31 NOTE — Patient Instructions (Signed)
Medication Instructions:  Your physician has recommended you make the following change in your medication:  1. Increased Coreg (Carvedilol) to 12.5 mg Twice Daily. Sent in new prescription for this dosage.    Labwork: None ordered  Testing/Procedures: None ordered  Follow-Up: Your physician recommends that you schedule a follow-up appointment in: 3 months with Dr. Alvino ChapelIngal.  Date & Time: ____________________________________________________  It was a pleasure seeing you today here in the office. Please do not hesitate to give us a call back if you have any further questions. [phone]  Creedmoor CellarPamela A. RN, BSN      Any Other Special Instructions Will Be Listed Below (If Applicable).     If you need a refill on your cardiac medications before your next appointment, please call your pharmacy.

## 2015-08-21 ENCOUNTER — Encounter: Payer: Self-pay | Admitting: Neurology

## 2015-08-21 ENCOUNTER — Ambulatory Visit (INDEPENDENT_AMBULATORY_CARE_PROVIDER_SITE_OTHER): Payer: Commercial Managed Care - HMO | Admitting: Neurology

## 2015-08-21 VITALS — BP 133/78 | HR 88 | Ht 68.0 in | Wt 188.4 lb

## 2015-08-21 DIAGNOSIS — I63112 Cerebral infarction due to embolism of left vertebral artery: Secondary | ICD-10-CM | POA: Diagnosis not present

## 2015-08-21 DIAGNOSIS — I480 Paroxysmal atrial fibrillation: Secondary | ICD-10-CM | POA: Diagnosis not present

## 2015-08-21 DIAGNOSIS — Z7901 Long term (current) use of anticoagulants: Secondary | ICD-10-CM | POA: Diagnosis not present

## 2015-08-21 DIAGNOSIS — I748 Embolism and thrombosis of other arteries: Secondary | ICD-10-CM | POA: Diagnosis not present

## 2015-08-21 DIAGNOSIS — K922 Gastrointestinal hemorrhage, unspecified: Secondary | ICD-10-CM

## 2015-08-21 NOTE — Progress Notes (Signed)
STROKE NEUROLOGY FOLLOW UP NOTE  NAME: Nancy Hahn Bruski DOB: May 03, 1931  REASON FOR VISIT: stroke follow up HISTORY FROM: chart  Today we had the pleasure of seeing Nancy Hahn [place] in follow-up at our Neurology Clinic. Pt was accompanied by sister.   History Summary [name] Captain is a 82 y.o. 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 MRI showed large left cerebellar infarct with small hemorrhagic transformation. Repeat CT showed expected evolution of stroke without hydrocephalus. CTA head and neck showed left PICA occlusion. EF 55-60% and TEE not able to do due ot loss of IV access. LDL 88 and A1C 6.7. Her stroke was considered embolic, not due to emboli from subclavian A thrombosis as it is distal to [place]. Also, she developed bloody vomitus, EGD showed single gastric ulcer and gastritis. Due to GIB and hemorrhagic transformation, eliquis was on hold. Her GIB was controlled with protonix. Due to the two embolic events, we recommended to initiate eliquis 7 days post stroke after discussion with GI. She was discharged to SNF after stabilization.   05/04/15 follow up - the patient has been doing well. However, she was not resumed Eliquis post discharge as outlined in DC summary.on 03/26/15, her 30 day cardiac monitoring showed AF with RVR, Eliquis 5mg  bid was resumed. She had follow up with cardiology on 04/26/15, doing well. No complains. Now at home.  Interval History During the interval time, pt has been doing well. Followed with cardiology and found to have more frequent afib and increased coreg to 12.5mg  bid. She is asymptomatic. No compliance. On eliquis without side effects.   REVIEW OF SYSTEMS: Full 14 system review of systems performed and notable only for those listed below and in HPI above, all others are negative:  Constitutional:   Cardiovascular:    Ear/Nose/Throat:   Skin:  Eyes:   Respiratory:   Gastroitestinal:   Genitourinary:  Hematology/Lymphatic:   Endocrine:  Musculoskeletal:   Allergy/Immunology:   Neurological:   Psychiatric:  Sleep:   The following represents the patient's updated allergies and side effects list: No Known Allergies  The neurologically relevant items on the patient's problem list were reviewed on today's visit.  Neurologic Examination  A problem focused neurological exam (12 or more points of the single system neurologic examination, vital signs counts as 1 point, cranial nerves count for 8 points) was performed.  Blood pressure 133/78, pulse 88, height 5\' 8"  (1.727 m), weight 188 lb 6.4 oz (85.458 kg).  General - Well nourished, well developed, in no apparent distress.  Ophthalmologic - Sharp disc margins OU.   Cardiovascular - Regular rate and rhythm, not in afib.  Mental Status -  Level of arousal and orientation to time, place, and person were intact. Language including expression, naming, comprehension was assessed and found intact. Fund of Knowledge was assessed and was intact.  Cranial Nerves II - XII - II - Visual field intact OU. III, IV, VI - Extraocular movements intact. V - Facial sensation intact bilaterally. VII - Facial movement intact bilaterally. VIII - Hearing & vestibular intact bilaterally. X - Palate elevates symmetrically. XI - Chin turning & shoulder shrug intact bilaterally. XII - Tongue protrusion intact.  Motor Strength - The patient's strength was normal in all extremities and pronator drift was absent.  Bulk was normal and fasciculations were absent.   Motor Tone - Muscle tone was  assessed at the neck and appendages and was normal.  Reflexes - The patient's reflexes were 1+ in all extremities and she had no pathological reflexes.  Sensory - Light touch, temperature/pinprick, vibration and proprioception, and Romberg testing were assessed and were normal.     Coordination - The patient had normal movements in the hands and feet with no ataxia or dysmetria. Tremor was absent.  Gait and Station - The patient's transfers, posture, gait, station, and turns were observed as normal.  Data reviewed: I personally reviewed the images and agree with the radiology interpretations.  Mr Laqueta Jean Wo Contrast 03/03/2015  Large acute hemorrhagic left mid to inferior cerebellar infarct with associated mass effect compressing the fourth ventricle. Interval slight increase in size of ventricles compared to recent CT consistent with developing hydrocephalus. Left vertebral artery is at least partially patent. Limited assessment of the left posterior inferior cerebellar artery.   CT HEAD 03/10/2015 Expected evolutionary changes of the inferior left cerebellar infarction. Less swelling and mass effect than on the previous studies. No evidence of complication such as hemorrhage. No new infarction. 03/06/2015 Large left cerebellar infarct with hemorrhage better appreciated on recently performed MR. Mass-effect upon the adjacent structures including narrowing of the fourth ventricle and inferior aspect of the aqueduct has progressed slightly since the prior exam (series 201, image 10). Dilated lateral ventricles/ mild hydrocephalus similar to prior exam (best appreciated when compared to remote CT).  CTA NECK 03/06/2015 Plaque carotid bifurcation / proximal internal carotid artery bilaterally with maximal 50% diameter stenosis proximal internal carotid artery bilaterally. Right vertebral artery is slightly larger than left. No significant stenosis of the cervical segment of the vertebral arteries on either side. Small to slightly moderate-size bilateral pleural effusions with adjacent passive atelectasis. Endotracheal tube and nasogastric tube in place.  CTA HEAD 03/06/2015 Mild narrowing irregularity M1 segment left middle cerebral artery. Mild narrowing right middle  cerebral artery bifurcation. Calcified plaque with mild narrowing cavernous segment internal carotid artery bilaterally. Narrowing of the left vertebral artery from the foramen magnum to formation of the basilar artery. Poor delineation of the left posterior inferior cerebellar artery and appears occluded. No significant stenosis of the distal right vertebral artery. Mild irregularity of the basilar artery without significant stenosis. Irregular anterior inferior cerebellar arteries bilaterally. Fetal type contribution to formation of the left posterior cerebral artery. No high-grade stenosis of the posterior cerebral artery however, distal branch vessel narrowing and irregularity greater on the right.  Upper endoscopy 03/10/2015  1. Single ulcer in the gastric body 2. Erosive gastritis in the gastric body 3. The EGD otherwise appeared normal  TTE  - Left ventricle: The cavity size was normal. Systolic function was normal. The estimated ejection fraction was in the range of 55%to 60%. Wall motion was normal; there were no regional wallmotion abnormalities. Features are consistent with a pseudonormalleft ventricular filling pattern, with concomitant abnormalrelaxation and increased filling pressure (grade 2 diastolicdysfunction). - Mitral valve: Mildly calcified annulus. There was mildregurgitation. - Left atrium: The atrium was mildly dilated.  TEE - not able to perform due to loss of IV access.   Component     Latest Ref Rng 03/10/2015  Cholesterol     0 - 200 mg/dL 742  Triglycerides     <150 mg/dL 595  HDL Cholesterol     >40 mg/dL 30 (L)  Total CHOL/HDL Ratio      4.8  VLDL     0 - 40 mg/dL 26  LDL (calc)  0 - 99 mg/dL 88  Hemoglobin Z6X     4.8 - 5.6 % 6.7 (H)  Mean Plasma Glucose      146  TSH     0.350 - 4.500 uIU/mL 2.029  Vitamin B12     180 - 914 pg/mL 472    Assessment: As you may recall, she is a 82 y.o. Caucasian female with PMH of HTN, left subclavian  artery thrombosis s/p thrombectomy on 02/28/2015 on Eliquis admitted on 03/03/15 for large left cerebellar infarct with small hemorrhagic transformation. Repeat CT showed expected evolution of stroke without hydrocephalus. CTA head and neck showed left PICA occlusion. EF 55-60% and TEE not able to do due ot loss of IV access. LDL 88 and A1C 6.7. Also, she developed UGIB, EGD showed single gastric ulcer and gastritis. Due to GIB and hemorrhagic transformation, eliquis was on hold. Due to the two embolic events, we recommended to initiate eliquis 7 days post stroke after discussion with GI. She was discharged to SNF. However, she was not resumed Eliquis post discharge.on 03/26/15, her 30 day cardiac monitoring showed AF with RVR, Eliquis  bid was resumed at that time. She had follow up with cardiology and increased coreg dose. No side effects from eliquis and stated compliance.  Plan:  - continue eliquis and zocor for stroke prevention - check BP at home and avoid low BP and keep hydration and avoid fall - follow up with cardiology as scheduled - Follow up with your primary care physician for stroke risk factor modification. Recommend maintain blood pressure goal <130/80, diabetes with hemoglobin A1c goal below 6.5% and lipids with LDL cholesterol goal below 70 mg/dL.  - home exercise, avoid over exertion and healthy diet - follow up in 6 months   No orders of the defined types were placed in this encounter.    No orders of the defined types were placed in this encounter.    Patient Instructions  - continue eliquis and zocor for stroke prevention - check BP at home and avoid low BP and keep hydration and avoid fall - follow up with cardiology as scheduled - Follow up with your primary care physician for stroke risk factor modification. Recommend maintain blood pressure goal <130/80, diabetes with hemoglobin A1c goal below 6.5% and lipids with LDL cholesterol goal below 70 mg/dL.  - home  exercise to the point you feel comfortable, avoid over exertion and healthy diet - follow up in 6 months    Marvel Plan, MD PhD [facility] [address], [address] [place], [place] [address] [phone]

## 2015-08-21 NOTE — Patient Instructions (Addendum)
-   continue eliquis and zocor for stroke prevention - check BP at home and avoid low BP and keep hydration and avoid fall - follow up with cardiology as scheduled - Follow up with your primary care physician for stroke risk factor modification. Recommend maintain blood pressure goal <130/80, diabetes with hemoglobin A1c goal below 6.5% and lipids with LDL cholesterol goal below 70 mg/dL.  - home exercise to the point you feel comfortable, avoid over exertion and healthy diet - follow up in 6 months

## 2015-09-06 DIAGNOSIS — B351 Tinea unguium: Secondary | ICD-10-CM | POA: Diagnosis not present

## 2015-09-06 DIAGNOSIS — M79672 Pain in left foot: Secondary | ICD-10-CM | POA: Diagnosis not present

## 2015-09-06 DIAGNOSIS — M79671 Pain in right foot: Secondary | ICD-10-CM | POA: Diagnosis not present

## 2015-10-29 NOTE — Progress Notes (Signed)
Cardiology Office Note   Date:  10/30/2015   ID:  Nancy Hahn, DOB 16-Jul-1931, MRN [id number]  Referring Doctor:  Rozanna BoxBABAOFF, MARC E, MD   Cardiologist:   Almond LintAileen Amaryllis Malmquist, MD   Reason for consultation:  Chief Complaint  Patient presents with  . OTHER    3 month f/u. Meds reviewed verbally with pt.      History of Present Illness: Nancy Hahn is a 82 y.o. female who presents for Follow-up for atrial fibrillation.  Patient [place] would like to transfer her care here [place].  In terms of atrial fib ablation, she does not feel any palpitations. She is not aware of any change in rhythm. She makes sure she takes her medications, especially at the adequacy twice a day. She had one fall, very mild 1 with no significant trauma. She lost her balance for a short while doing laundry. She fell down on the floor on her knees. No bruising. No head trauma. She has been very careful since.  In terms of hypertension, she does not have blood pressure monitor at home and will be getting one soon.  She denies chest pain, shortness of breath, loss of consciousness.   ROS:  Please see the history of present illness. Aside from mentioned under HPI, all other systems are reviewed and negative.     Past Medical History:  Diagnosis Date  . Atrial fibrillation (HCC)   . Breast mass   . Hypertension   . Stroke (cerebrum) [facility](HCC)     Past Surgical History:  Procedure Laterality Date  . BREAST SURGERY  1980   biopsy  . DILATION AND CURETTAGE OF UTERUS    . EMBOLECTOMY Left 02/28/2015   Procedure: EMBOLECTOMY ;  Surgeon: Renford DillsGregory G Schnier, MD;  Location: [facility] ORS;  Service: Vascular;  Laterality: Left;  . ESOPHAGOGASTRODUODENOSCOPY N/A 03/10/2015   Procedure: ESOPHAGOGASTRODUODENOSCOPY (EGD);  Surgeon: Meryl DareMalcolm T Stark, MD;  [facility];  Service: Endoscopy;  Laterality: N/A;  . FOOT SURGERY    . PERIPHERAL VASCULAR  CATHETERIZATION Left 02/28/2015   Procedure: Upper Extremity Angiography;  Surgeon: Renford DillsGregory G Schnier, MD;  Location: [facility] INVASIVE CV LAB;  Service: Cardiovascular;  Laterality: Left;  . PERIPHERAL VASCULAR CATHETERIZATION  02/28/2015   Procedure: Upper Extremity Intervention;  Surgeon: Renford DillsGregory G Schnier, MD;  Location: [facility] INVASIVE CV LAB;  Service: Cardiovascular;;     reports that she has never smoked. She has never used smokeless tobacco. She reports that she does not drink alcohol or use drugs.   family history includes Cancer in her brother, maternal aunt, and sister; Diabetes type II in her mother and sister; Emphysema in her father.   Current Outpatient Prescriptions  Medication Sig Dispense Refill  . apixaban (ELIQUIS) 5 MG TABS tablet Take 1 tablet (5 mg total) by mouth 2 (two) times daily. 60 tablet 6  . Calcium Carbonate-Vitamin D 600-400 MG-UNIT tablet Take by mouth.    . carvedilol (COREG) 12.5 MG tablet Take 1 tablet (12.5 mg total) by mouth 2 (two) times daily. 60 tablet 6  . diltiazem (CARDIZEM CD) 120 MG 24 hr capsule Take 1 capsule (120 mg total) by mouth daily. 30 capsule 1  . hydrALAZINE (APRESOLINE) 50 MG tablet Take 1 tablet (50 mg total) by mouth every 8 (eight) hours.    Marland Kitchen. lisinopril (PRINIVIL,ZESTRIL) 40 MG tablet Take 40 mg by mouth daily.     . metFORMIN (GLUCOPHAGE)  500 MG tablet Take by mouth daily with breakfast.    . pantoprazole (PROTONIX) 40 MG tablet Take 1 tablet (40 mg total) by mouth 2 (two) times daily.    . simvastatin (ZOCOR) 20 MG tablet Take 20 mg by mouth daily.     No current facility-administered medications for this visit.     Allergies: Review of patient's allergies indicates no known allergies.    PHYSICAL EXAM: VS:  BP (!) 160/80 (BP Location: Left Arm, Patient Position: Sitting, Cuff Size: Large)   Pulse 92   Ht 5\' 8"  (1.727 m)   Wt 189 lb 12 oz (86.1 kg)   BMI 28.85 kg/m  , Body mass index is 28.85 kg/m. Wt Readings from Last 3  Encounters:  10/30/15 189 lb 12 oz (86.1 kg)  08/21/15 188 lb 6.4 oz (85.5 kg)  07/31/15 188 lb 12 oz (85.6 kg)    GENERAL:  well developed, well nourished, not in acute distress HEENT: normocephalic, pink conjunctivae, anicteric sclerae, no xanthelasma, normal dentition, oropharynx clear NECK:  no neck vein engorgement, JVP normal, no hepatojugular reflux, carotid upstroke brisk and symmetric, no bruit, no thyromegaly, no lymphadenopathy LUNGS:  good respiratory effort, clear to auscultation bilaterally CV:  PMI not displaced, no thrills, no lifts, irregular rhythm,  S2 within normal limits, no palpable S3 or S4, no murmurs, no rubs, no gallops ABD:  Soft, nontender, nondistended, normoactive bowel sounds, no abdominal aortic bruit, no hepatomegaly, no splenomegaly MS: nontender back, no kyphosis, no scoliosis, no joint deformities EXT:  2+ DP/PT pulses, no edema, no varicosities, no cyanosis, no clubbing SKIN: warm, nondiaphoretic, normal turgor, no ulcers NEUROPSYCH: alert, oriented to person, place, and time, sensory/motor grossly intact, normal mood, appropriate affect  Recent Labs: 03/06/2015: ALT 14 03/08/2015: Hahn Natriuretic Peptide 147.7; Magnesium 2.1 03/10/2015: TSH 2.029 03/11/2015: BUN 38; Creatinine, Ser 0.97; Potassium 4.0; Sodium 139 03/12/2015: Hemoglobin 8.8; Platelets 418   Lipid Panel    Component Value Date/Time   CHOL 144 03/10/2015 0900   TRIG 128 03/10/2015 0900   HDL 30 (L) 03/10/2015 0900   CHOLHDL 4.8 03/10/2015 0900   VLDL 26 03/10/2015 0900   LDLCALC 88 03/10/2015 0900     Other studies Reviewed:  EKG:  The ekg from 07/31/2015 was personally reviewed by me and it revealed atrial fibrillation, ventricular rate of 77 BPM.  EKG from 10/30/2015 was personally reviewed by me and it revealed atrial fibrillation, 92 BPM, nonspecific ST-T wave changes.  Additional studies/ records that were reviewed personally reviewed by me today include:  Echocardiogram on  02/28/2015: Left ventricle: The cavity size was normal. Systolic function was  normal. The estimated ejection fraction was in the range of 55%  to 60%. Wall motion was normal; there were no regional wall  motion abnormalities. Features are consistent with a pseudonormal  left ventricular filling pattern, with concomitant abnormal  relaxation and increased filling pressure (grade 2 diastolic  dysfunction). - Mitral valve: Mildly calcified annulus. There was mild  regurgitation. - Left atrium: The atrium was mildly dilated.  Cardiac monitor from 03/27/2015, Dr. Ebony Hail. fib with RVR  ASSESSMENT AND PLAN:  Atrial fibrillation, likely persistent Patient not symptomatic Review of event monitor shows VR 78-190 Continue carvedilol to 12.5 mg twice a day. Continue to monitor blood pressure. If there is a need to go up on the dose of antihypertensive medications, will go up to carvedilol 25 twice a day. Continue stroke risk reduction with Eliquis. Patient tolerating this.  Hypertension BP is slightly elevated. Patient will be getting a blood pressure monitor zone. Continue monitoring BP. Continue current medical therapy and lifestyle changes. Patient to call our office if greater than 140/80 persistently, will increase carvedilol to 25 twice a day   Current medicines are reviewed at length with the patient today.  The patient does not have concerns regarding medicines.  Labs/ tests ordered today include:  Orders Placed This Encounter  Procedures  . EKG 12-Lead    I had a lengthy and detailed discussion with the patient regarding diagnoses, prognosis, diagnostic options, treatment options , and side effects of medications.   I counseled the patient on importance of lifestyle modification including heart healthy diet, regular physical activity.   Disposition:   FU with undersignedIn 6 months   Signed, Almond Lint, MD  10/30/2015 3:22 PM    [facility]

## 2015-10-30 ENCOUNTER — Ambulatory Visit (INDEPENDENT_AMBULATORY_CARE_PROVIDER_SITE_OTHER): Payer: Commercial Managed Care - HMO | Admitting: Cardiology

## 2015-10-30 ENCOUNTER — Encounter: Payer: Self-pay | Admitting: Cardiology

## 2015-10-30 VITALS — BP 160/80 | HR 92 | Ht 68.0 in | Wt 189.8 lb

## 2015-10-30 DIAGNOSIS — I10 Essential (primary) hypertension: Secondary | ICD-10-CM

## 2015-10-30 DIAGNOSIS — I481 Persistent atrial fibrillation: Secondary | ICD-10-CM | POA: Diagnosis not present

## 2015-10-30 DIAGNOSIS — I4819 Other persistent atrial fibrillation: Secondary | ICD-10-CM

## 2015-10-30 NOTE — Patient Instructions (Signed)
Follow-Up: Your physician wants you to follow-up in: 6 months with Dr. Alvino ChapelIngal. You will receive a reminder letter in the mail two months in advance. If you don't receive a letter, please call our office to schedule the follow-up appointment.  It was a pleasure seeing you today here in the office. Please do not hesitate to give us a call back if you have any further questions. [phone]  Lake Catherine CellarPamela A. RN, BSN     Any Other Special Instructions Will Be Listed Below (If Applicable).  Your physician has requested that you regularly monitor and record your blood pressure readings at home. Please use the same machine at the same time of day to check your readings and record them to bring to your follow-up visit.

## 2015-11-15 ENCOUNTER — Encounter: Payer: Self-pay | Admitting: *Deleted

## 2015-11-15 ENCOUNTER — Other Ambulatory Visit: Payer: Self-pay | Admitting: *Deleted

## 2015-11-15 NOTE — Patient Outreach (Signed)
[facility] Care Management  11/15/2015  Nancy Hahn [birth year] [id number]  [facility]:  Telephone call to patient; family memmber answered call & gave RN case manager alternate number to call patient.  Call placed to patient who was advised of reason for call & of [facility] care management services.   Patient states she currently is independent with her care and does not have any health concerns at this time. States she attends all of her doctor's appointments and most recent appointment was in Sept. 2017.   States she drives to primary care appointments and sister takes her to out of town appointments. States she manages her own medications & takes medications as prescribed by her doctors. States she has used mail order in the past but prefers to use local pharmacy & picks her own medications up.  States she has support from her sister as needed. Also has support from grandson, neighbors & church friends if needed.   Patient states she does not need any case management -community or telephone contact at this. Patient agrees to accept  [facility] contact information.  Plan; Send [facility] brochure. Send MD closure letter. Close case.    Nancy CanLinda Ellyce Lafevers, RN BSN CCM Care Management Coordinator [facility] Care Management  [phone]

## 2015-12-17 DIAGNOSIS — E119 Type 2 diabetes mellitus without complications: Secondary | ICD-10-CM | POA: Diagnosis not present

## 2015-12-17 DIAGNOSIS — E7800 Pure hypercholesterolemia, unspecified: Secondary | ICD-10-CM | POA: Diagnosis not present

## 2015-12-17 DIAGNOSIS — Z0000 Encounter for general adult medical examination without abnormal findings: Secondary | ICD-10-CM | POA: Diagnosis not present

## 2015-12-17 DIAGNOSIS — Z79899 Other long term (current) drug therapy: Secondary | ICD-10-CM | POA: Diagnosis not present

## 2016-02-21 ENCOUNTER — Ambulatory Visit: Payer: Commercial Managed Care - HMO | Admitting: Neurology

## 2016-04-02 ENCOUNTER — Ambulatory Visit: Payer: Commercial Managed Care - HMO | Admitting: Neurology

## 2016-05-11 DIAGNOSIS — 419620001 Death: Secondary | SNOMED CT | POA: Diagnosis not present

## 2016-05-11 DEATH — deceased

## 2017-12-24 IMAGING — DX DG CHEST 1V PORT
1 series · 1 of 1 positions shown · non-contrast
Comparison: 03/07/2015.

CLINICAL DATA: Shortness of breath.

EXAM:
PORTABLE CHEST 1 VIEW

[chest ap]
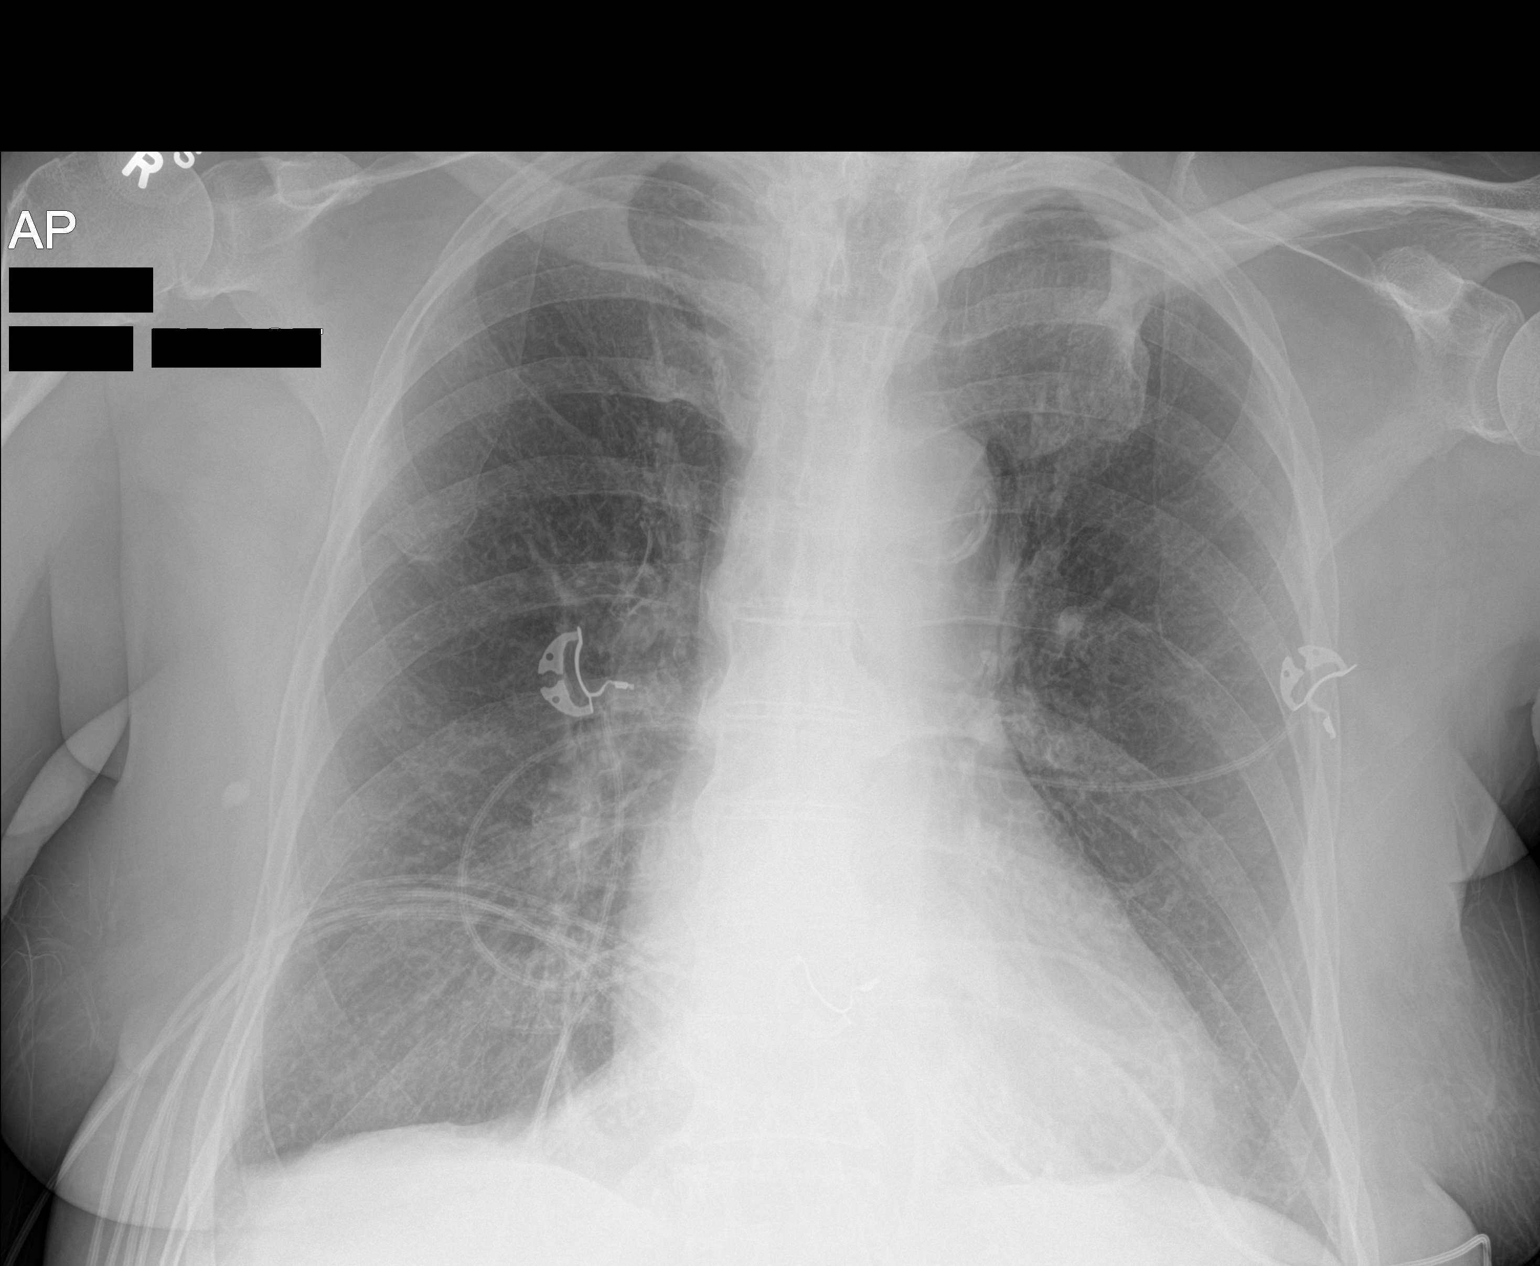

[1 of 1 positions shown; findings below may reference images not displayed]

FINDINGS: Mediastinum hilar structures normal. Cardiomegaly with normal
pulmonary vascularity. Interval near complete clearing of bilateral
pulmonary infiltrates/edema. Small right pleural effusion cannot be
excluded. Left costophrenic angle not imaged. No pneumothorax.
IMPRESSION: Stable cardiomegaly with interim near complete resolution of
bilateral pulmonary edema and/or infiltrates .

## 2017-12-25 IMAGING — CT CT HEAD W/O CM
2 series · 16 of 30 positions shown, 20 images · non-contrast
Comparison: 03/06/2015 and previous

CLINICAL DATA: Recent stroke with left-sided weakness and slurred
speech. New frontal headache.

EXAM:
CT HEAD WITHOUT CONTRAST
TECHNIQUE: Contiguous axial images were obtained from the base of the skull
through the vertex without intravenous contrast.

[Series 201: head w/o, idose (1) · axial · non-contrast · 0.49mm/px · z∈[+142,+272]mm · 13 of 32 slices shown, 17 images]
[im 3/32  brain]
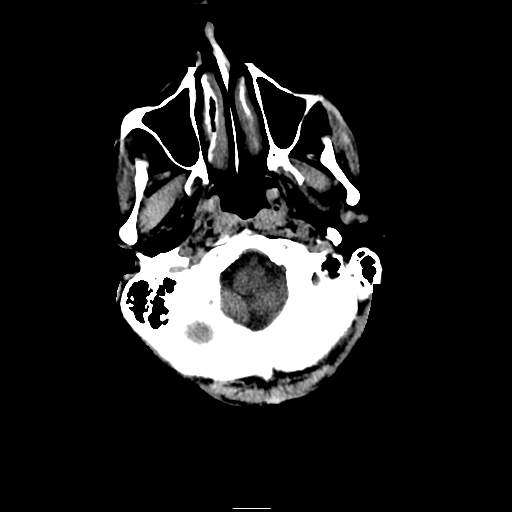
[im 3/32  bone]
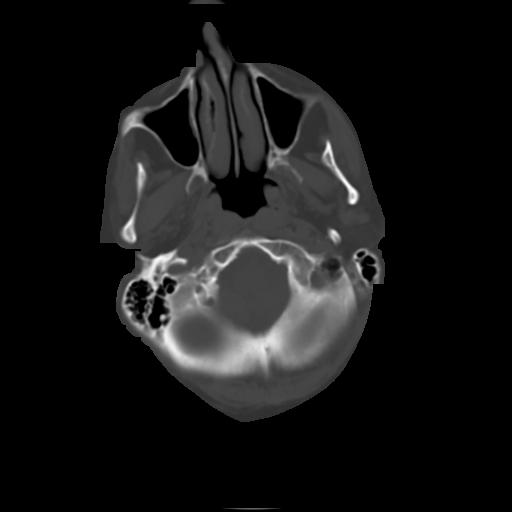
[im 5/32  brain]
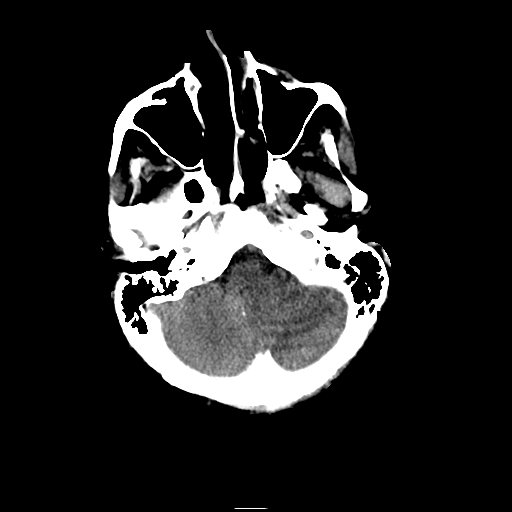
[im 7/32  brain]
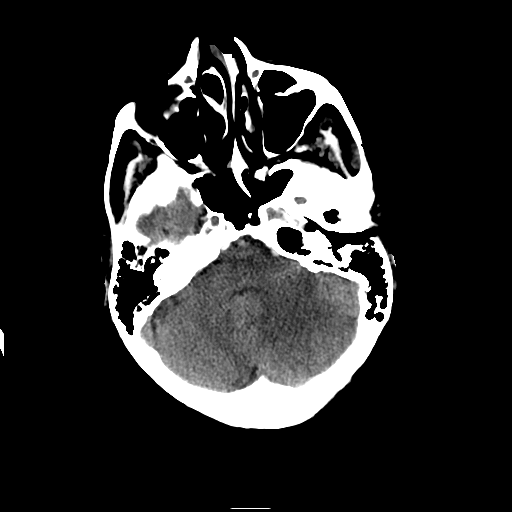
[im 9/32  brain]
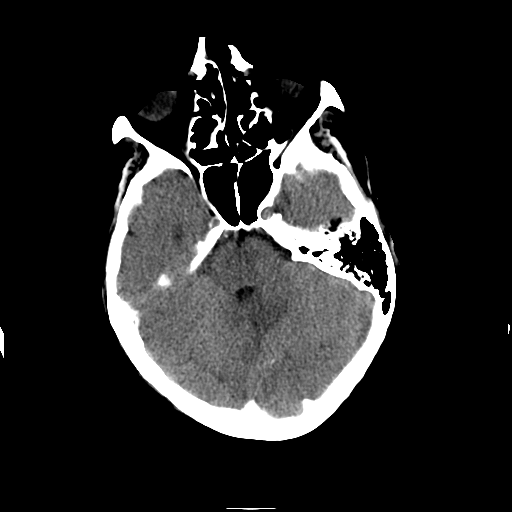
[im 12/32  brain]
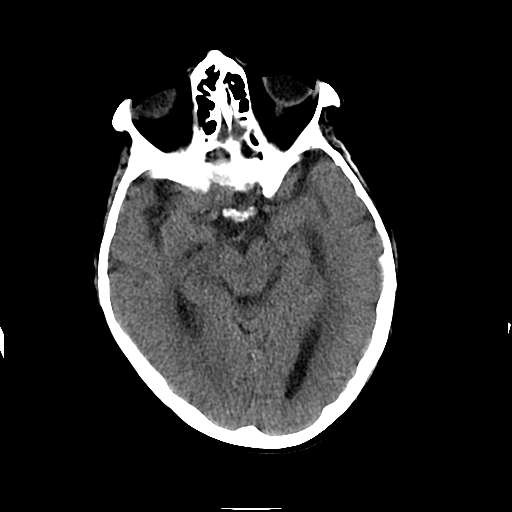
[im 12/32  bone]
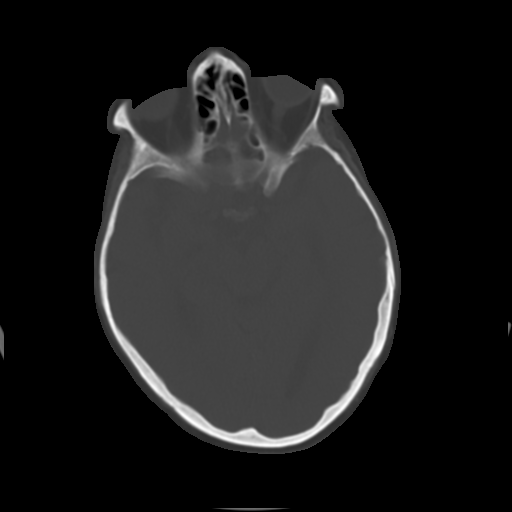
[im 14/32  brain]
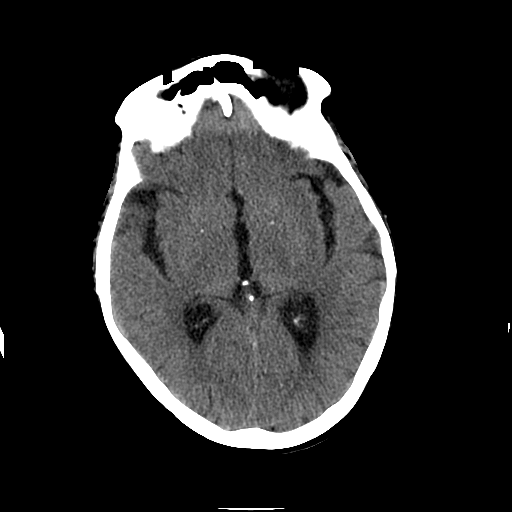
[im 16/32  brain]
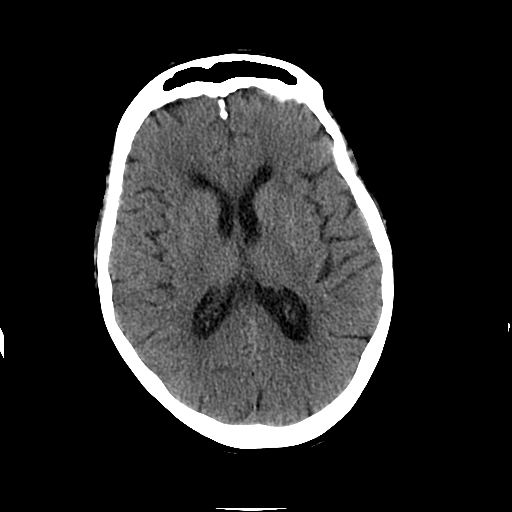
[im 18/32  brain]
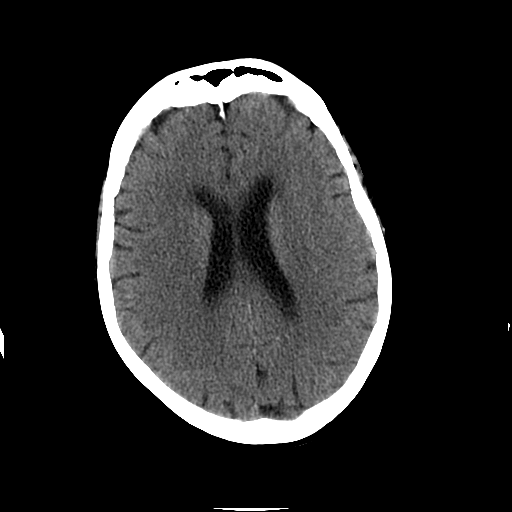
[im 20/32  brain]
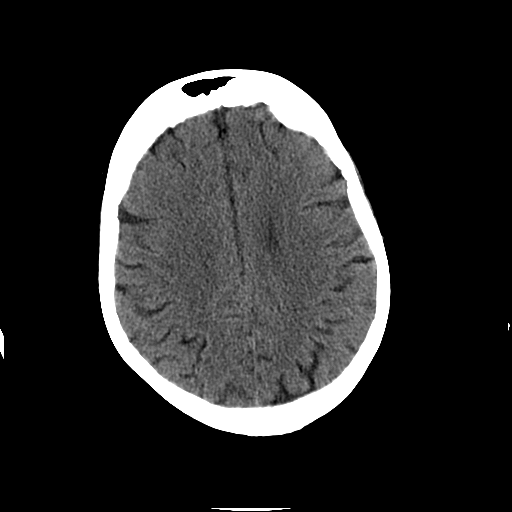
[im 20/32  bone]
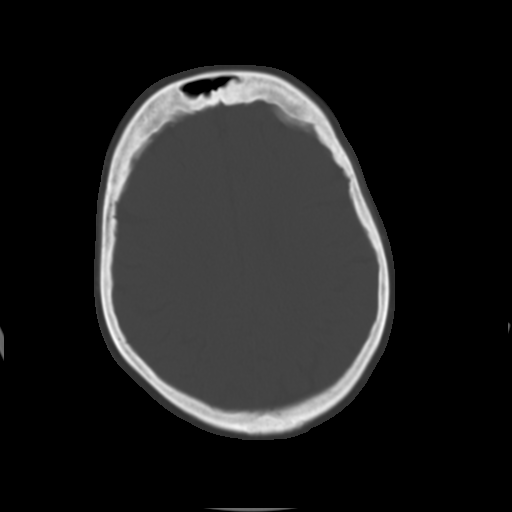
[im 23/32  brain]
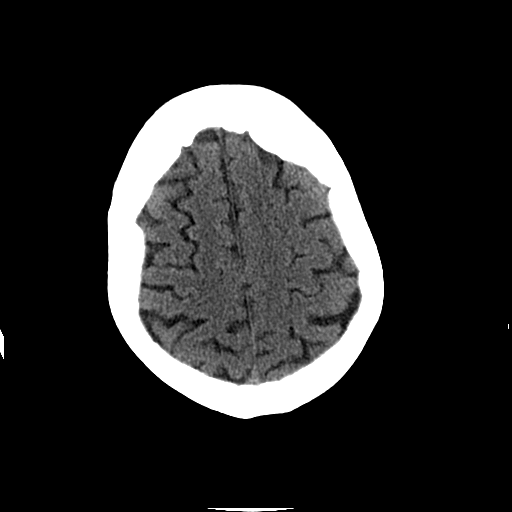
[im 25/32  brain]
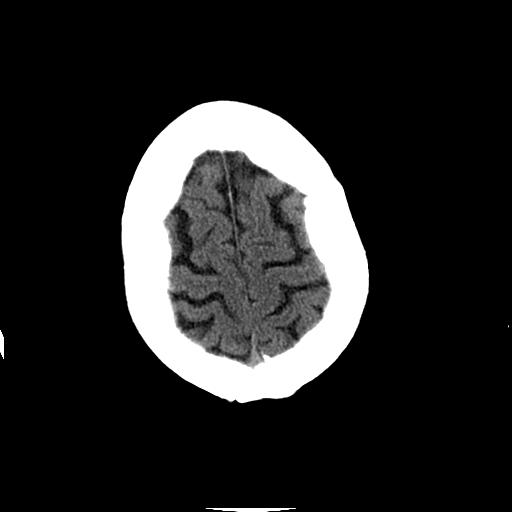
[im 27/32  brain]
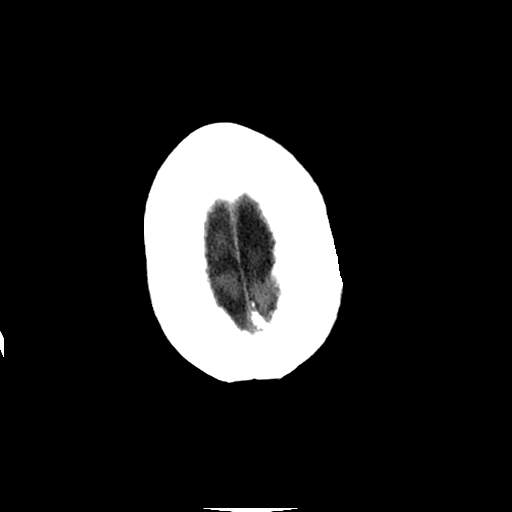
[im 29/32  brain]
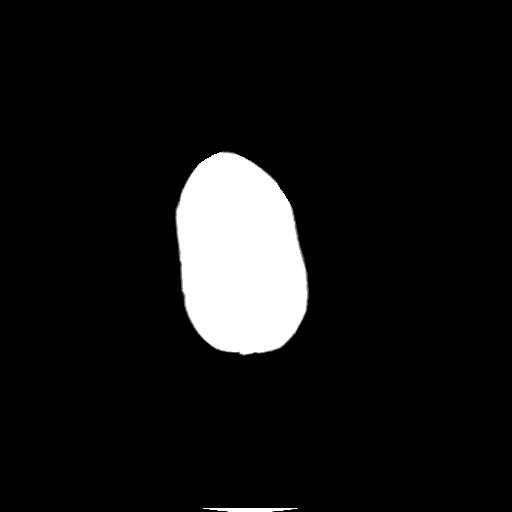
[im 29/32  bone]
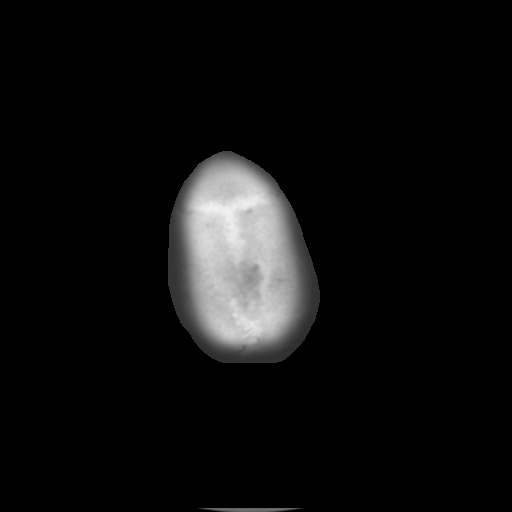

[Series 202: head w/o bone, idose (1) · axial · non-contrast · 0.49mm/px · z∈[+142,+187]mm · 3 of 32 slices shown]
[im 3/32  bone]
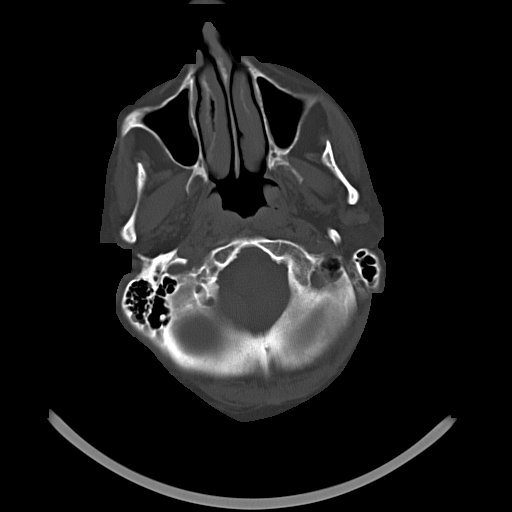
[im 7/32  bone]
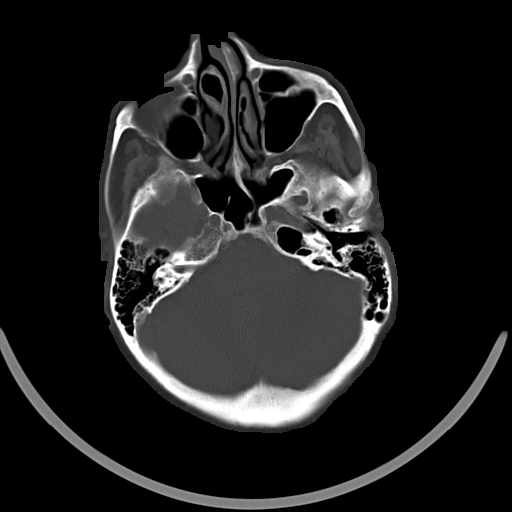
[im 12/32  bone]
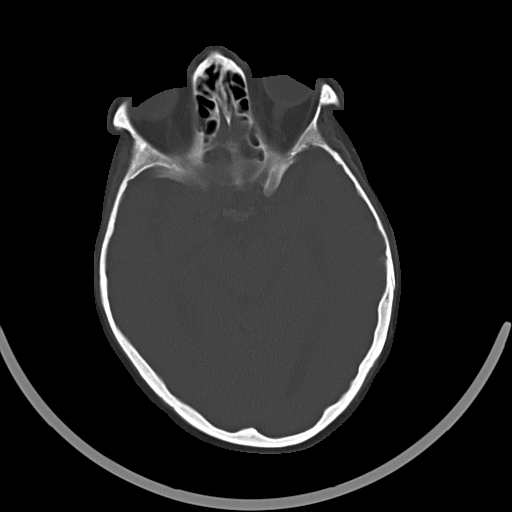

[16 of 30 positions shown; findings below may reference images not displayed]

FINDINGS: Go low-density persists in the inferior cerebellum on the left, but
swelling is subsidence slightly. Less mass effect upon the fourth
ventricle. No evidence of developing hemorrhage. No new insult.
Cerebral hemispheres are normal. Ventricular size is stable. No
extra-axial collection. Sinuses, middle ears and mastoids are clear.
IMPRESSION: Expected evolutionary changes of the inferior left cerebellar
infarction. Less swelling and mass effect than on the previous
studies. No evidence of complication such as hemorrhage. No new
infarction.
